# Patient Record
Sex: Male | Born: 1965 | Race: White | Hispanic: No | Marital: Married | Smoking: Never smoker
Health system: Southern US, Community
[De-identification: ages and names within clinical notes are randomized; demographics above are authoritative.]

## PROBLEM LIST (undated history)

## (undated) DIAGNOSIS — F32A Depression, unspecified: Secondary | ICD-10-CM

## (undated) DIAGNOSIS — M8700 Idiopathic aseptic necrosis of unspecified bone: Secondary | ICD-10-CM

## (undated) DIAGNOSIS — Z21 Asymptomatic human immunodeficiency virus [HIV] infection status: Secondary | ICD-10-CM

## (undated) DIAGNOSIS — K219 Gastro-esophageal reflux disease without esophagitis: Secondary | ICD-10-CM

## (undated) DIAGNOSIS — F419 Anxiety disorder, unspecified: Secondary | ICD-10-CM

## (undated) DIAGNOSIS — M359 Systemic involvement of connective tissue, unspecified: Secondary | ICD-10-CM

## (undated) DIAGNOSIS — F329 Major depressive disorder, single episode, unspecified: Secondary | ICD-10-CM

## (undated) DIAGNOSIS — B20 Human immunodeficiency virus [HIV] disease: Secondary | ICD-10-CM

## (undated) DIAGNOSIS — M19049 Primary osteoarthritis, unspecified hand: Secondary | ICD-10-CM

## (undated) HISTORY — PX: JOINT REPLACEMENT: SHX530

## (undated) HISTORY — DX: Primary osteoarthritis, unspecified hand: M19.049

## (undated) HISTORY — PX: TONSILLECTOMY: SUR1361

---
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
## 1996-03-04 DIAGNOSIS — Z21 Asymptomatic human immunodeficiency virus [HIV] infection status: Secondary | ICD-10-CM | POA: Insufficient documentation

## 2011-03-05 HISTORY — PX: CHOLECYSTECTOMY: SHX55

## 2014-10-05 ENCOUNTER — Emergency Department: Payer: Medicare Other

## 2014-10-05 ENCOUNTER — Emergency Department
Admission: EM | Admit: 2014-10-05 | Discharge: 2014-10-05 | Disposition: A | Discharge status: Home / Self Care | Payer: Medicare Other | Attending: Student | Admitting: Student

## 2014-10-05 ENCOUNTER — Other Ambulatory Visit: Payer: Self-pay

## 2014-10-05 ENCOUNTER — Encounter: Payer: Self-pay | Admitting: Medical Oncology

## 2014-10-05 DIAGNOSIS — T426X5A Adverse effect of other antiepileptic and sedative-hypnotic drugs, initial encounter: Secondary | ICD-10-CM | POA: Insufficient documentation

## 2014-10-05 DIAGNOSIS — Z96642 Presence of left artificial hip joint: Secondary | ICD-10-CM | POA: Diagnosis not present

## 2014-10-05 DIAGNOSIS — G8929 Other chronic pain: Secondary | ICD-10-CM | POA: Diagnosis not present

## 2014-10-05 DIAGNOSIS — F1210 Cannabis abuse, uncomplicated: Secondary | ICD-10-CM | POA: Diagnosis not present

## 2014-10-05 DIAGNOSIS — F1310 Sedative, hypnotic or anxiolytic abuse, uncomplicated: Secondary | ICD-10-CM | POA: Diagnosis not present

## 2014-10-05 DIAGNOSIS — Z79899 Other long term (current) drug therapy: Secondary | ICD-10-CM | POA: Diagnosis not present

## 2014-10-05 DIAGNOSIS — Z880 Allergy status to penicillin: Secondary | ICD-10-CM | POA: Insufficient documentation

## 2014-10-05 DIAGNOSIS — Z21 Asymptomatic human immunodeficiency virus [HIV] infection status: Secondary | ICD-10-CM | POA: Insufficient documentation

## 2014-10-05 DIAGNOSIS — Z792 Long term (current) use of antibiotics: Secondary | ICD-10-CM | POA: Insufficient documentation

## 2014-10-05 DIAGNOSIS — L989 Disorder of the skin and subcutaneous tissue, unspecified: Secondary | ICD-10-CM

## 2014-10-05 DIAGNOSIS — M87051 Idiopathic aseptic necrosis of right femur: Secondary | ICD-10-CM | POA: Insufficient documentation

## 2014-10-05 DIAGNOSIS — T404X5A Adverse effect of other synthetic narcotics, initial encounter: Secondary | ICD-10-CM | POA: Insufficient documentation

## 2014-10-05 DIAGNOSIS — T50905A Adverse effect of unspecified drugs, medicaments and biological substances, initial encounter: Secondary | ICD-10-CM

## 2014-10-05 DIAGNOSIS — R000 Tachycardia, unspecified: Secondary | ICD-10-CM | POA: Insufficient documentation

## 2014-10-05 DIAGNOSIS — B20 Human immunodeficiency virus [HIV] disease: Secondary | ICD-10-CM

## 2014-10-05 HISTORY — DX: Asymptomatic human immunodeficiency virus (hiv) infection status: Z21

## 2014-10-05 HISTORY — DX: Human immunodeficiency virus (HIV) disease: B20

## 2014-10-05 LAB — URINALYSIS COMPLETE WITH MICROSCOPIC (ARMC ONLY)
Bacteria, UA: NONE SEEN
Bilirubin Urine: NEGATIVE
Glucose, UA: NEGATIVE mg/dL
HGB URINE DIPSTICK: NEGATIVE
LEUKOCYTES UA: NEGATIVE
Nitrite: NEGATIVE
PROTEIN: NEGATIVE mg/dL
Specific Gravity, Urine: 1.01 (ref 1.005–1.030)
Squamous Epithelial / LPF: NONE SEEN
pH: 8 (ref 5.0–8.0)

## 2014-10-05 LAB — COMPREHENSIVE METABOLIC PANEL
ALBUMIN: 4.9 g/dL (ref 3.5–5.0)
ALK PHOS: 332 U/L — AB (ref 38–126)
ALT: 242 U/L — ABNORMAL HIGH (ref 17–63)
AST: 188 U/L — ABNORMAL HIGH (ref 15–41)
Anion gap: 11 (ref 5–15)
BILIRUBIN TOTAL: 0.8 mg/dL (ref 0.3–1.2)
BUN: 6 mg/dL (ref 6–20)
CALCIUM: 9.2 mg/dL (ref 8.9–10.3)
CO2: 27 mmol/L (ref 22–32)
CREATININE: 0.61 mg/dL (ref 0.61–1.24)
Chloride: 101 mmol/L (ref 101–111)
GFR calc non Af Amer: >60 mL/min (ref >60)
GLUCOSE: 117 mg/dL — AB (ref 65–99)
POTASSIUM: 3.7 mmol/L (ref 3.5–5.1)
SODIUM: 139 mmol/L (ref 135–145)
TOTAL PROTEIN: 8 g/dL (ref 6.5–8.1)

## 2014-10-05 LAB — URINE DRUG SCREEN, QUALITATIVE (ARMC ONLY)
AMPHETAMINES, UR SCREEN: NOT DETECTED
Barbiturates, Ur Screen: NOT DETECTED
Benzodiazepine, Ur Scrn: NOT DETECTED
Cannabinoid 50 Ng, Ur ~~LOC~~: POSITIVE — AB
Cocaine Metabolite,Ur ~~LOC~~: NOT DETECTED
MDMA (Ecstasy)Ur Screen: NOT DETECTED
Methadone Scn, Ur: NOT DETECTED
OPIATE, UR SCREEN: NOT DETECTED
Phencyclidine (PCP) Ur S: NOT DETECTED
Tricyclic, Ur Screen: POSITIVE — AB

## 2014-10-05 LAB — CBC
HEMATOCRIT: 43.9 % (ref 40.0–52.0)
Hemoglobin: 14.8 g/dL (ref 13.0–18.0)
MCH: 31.8 pg (ref 26.0–34.0)
MCHC: 33.8 g/dL (ref 32.0–36.0)
MCV: 94.3 fL (ref 80.0–100.0)
PLATELETS: 114 10*3/uL — AB (ref 150–440)
RBC: 4.66 MIL/uL (ref 4.40–5.90)
RDW: 12.9 % (ref 11.5–14.5)
WBC: 5 10*3/uL (ref 3.8–10.6)

## 2014-10-05 LAB — TROPONIN I: Troponin I: <0.03 ng/mL (ref <0.031)

## 2014-10-05 LAB — TSH: TSH: 0.958 u[IU]/mL (ref 0.350–4.500)

## 2014-10-05 MED ORDER — SODIUM CHLORIDE 0.9 % IV BOLUS (SEPSIS)
1000.0000 mL | Freq: Once | INTRAVENOUS | Status: AC
  Administered 2014-10-05: 1000 mL via INTRAVENOUS

## 2014-10-05 MED ORDER — DOXYCYCLINE HYCLATE 50 MG PO CAPS: 100.0000 mg | ORAL_CAPSULE | Freq: Two times a day (BID) | ORAL | Status: DC

## 2014-10-05 MED ORDER — DOXYCYCLINE HYCLATE 100 MG PO TABS
100.0000 mg | ORAL_TABLET | Freq: Once | ORAL | Status: AC
  Administered 2014-10-05: 100 mg via ORAL
  Filled 2014-10-05: qty 1

## 2014-10-05 NOTE — ED Provider Notes (Signed)
-----------------------------------------   5:28 PM on 10/05/2014 -----------------------------------------  Heart rate improved to 98 bpm after fluids. Patient is amenable to discharge and will follow up with his doctor tomorrow. Discharged with return precautions and discharge instructions provided by Dr. Carollee Massed.  Gayla Doss, MD 10/05/14 1728

## 2014-10-05 NOTE — ED Provider Notes (Signed)
[facility] Emergency Department Provider Note  ____________________________________________  Time seen: 1352  I have reviewed the triage vital signs and the nursing notes.   HISTORY  Chief Complaint Tachycardia after ingestion of old medicine    HPI Roy Trujillo is a 49 y.o. male who presents to the emergency department shaky and nervous after having taken a 25-year-old Flexeril and some older hydrocodone on this morning due to pain in his hips.  The patient has a history of avascular necrosis. 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 He is currently out of those medications. He has an appointment in [place], where he used to live until recently, with his doctors there tomorrow.  In addition to feeling shaky and nervous and having some palpitations, feeling like his heart is racing, the patient woke this morning with what he describes as a insect sting behind his left shoulder. He has an area that is erythematous with the center portion being somewhat excoriated.  He denies any fever or chills. He has HIV, is on antiretrovirals, and while he does not know what his CD4 count or his viral load, he reports he thinks that they are both good.    Past Medical History  Diagnosis Date  . HIV (human immunodeficiency virus infection)     There are no active problems to display for this patient.   Past Surgical History  Procedure Laterality Date  . Joint replacement    . Cholecystectomy      Current Outpatient Rx  Name  Route  Sig  Dispense  Refill  . abacavir (ZIAGEN) 300 MG tablet   Oral   Take 600 mg by mouth daily.         Marland Kitchen elvitegravir-cobicistat-emtricitabine-tenofovir (STRIBILD) 150-150-200-300 MG TABS tablet   Oral   Take 1 tablet by mouth daily with breakfast.         . gabapentin (NEURONTIN) 300 MG capsule   Oral   Take 300 mg by mouth 3 (three) times  daily.         . promethazine (PHENERGAN) 25 MG tablet   Oral   Take 25 mg by mouth every 6 (six) hours as needed for nausea or vomiting.         . traMADol (ULTRAM) 50 MG tablet   Oral   Take 50 mg by mouth every 6 (six) hours as needed.         . doxycycline (VIBRAMYCIN) 50 MG capsule   Oral   Take 2 capsules (100 mg total) by mouth 2 (two) times daily.   28 capsule   0     Allergies Penicillins; Bactrim; and Zerit  No family history on file.  Social History History  Substance Use Topics  . Smoking status: Never Smoker   . Smokeless tobacco: Not on file  . Alcohol Use: Yes     Comment: occ    Review of Systems  Constitutional: Negative for fever. ENT: Negative for sore throat. Cardiovascular: Negative for chest pain. Positive for since of heart racing. Respiratory: Negative for shortness of breath. Gastrointestinal: Negative for abdominal pain, vomiting and diarrhea. Genitourinary: Negative for dysuria. Musculoskeletal: history of avascular necrosis with chronic hip pain.. Skin: positive for a circular lesion behind his left shoulder. Neurological: Negative for headaches   10-point ROS otherwise negative.  ____________________________________________   PHYSICAL EXAM:  VITAL SIGNS: ED Triage Vitals  Enc Vitals Group  BP 10/05/14 1140 174/147 mmHg     Pulse Rate 10/05/14 1140 117     Resp 10/05/14 1140 20     Temp 10/05/14 1140 98.2 F (36.8 C)     Temp Source 10/05/14 1140 Oral     SpO2 10/05/14 1140 99 %     Weight 10/05/14 1140 110 lb (49.896 kg)     Height 10/05/14 1140 5\' 6"  (1.676 m)     Head Cir --      Peak Flow --      Pain Score 10/05/14 1141 0     Pain Loc --      Pain Edu? --      Excl. in GC? --     Constitutional:  Alert and oriented. Thin but overall appears well-nourished and well-developed. ENT   Head: Normocephalic and atraumatic.   Nose: No congestion/rhinnorhea.   Mouth/Throat: Mucous membranes are  moist. Cardiovascular: tachycardic to 120 on exam, regular rhythm, no murmur noted Respiratory:  Normal respiratory effort, no tachypnea.    Breath sounds are clear and equal bilaterally.  Gastrointestinal: Soft and nontender. No distention.  Back: No muscle spasm, no tenderness, no CVA tenderness. Musculoskeletal: No deformity noted. Nontender with normal range of motion in all extremities.  No noted edema. Neurologic:  Normal speech and language. No gross focal neurologic deficits are appreciated.  Skin:  There is a circular lesion behind his left shoulder. There appears to be a focal central area that almost looks like the patient may have been stung by something. The area surrounding this with a 2-2-1/2 cm diameter appears excoriated and somewhat red. The area beyond that which extends another 1 cm, is erythemic. There is no nodule underneath it, it does not appear to be an abscess. On firm exam there is no drainage. Psychiatric: Mood and affect are normal. Speech and behavior are normal.  ____________________________________________    LABS (pertinent positives/negatives)  Labs Reviewed  CBC - Abnormal; Notable for the following:    Platelets 114 (*)    All other components within normal limits  COMPREHENSIVE METABOLIC PANEL - Abnormal; Notable for the following:    Glucose, Bld 117 (*)    AST 188 (*)    ALT 242 (*)    Alkaline Phosphatase 332 (*)    All other components within normal limits  URINALYSIS COMPLETEWITH MICROSCOPIC ([facility] ONLY) - Abnormal; Notable for the following:    Color, Urine YELLOW (*)    APPearance CLEAR (*)    Ketones, ur 1+ (*)    All other components within normal limits  URINE DRUG SCREEN, QUALITATIVE ([facility] ONLY) - Abnormal; Notable for the following:    Tricyclic, Ur Screen POSITIVE (*)    Cannabinoid 50 Ng, Ur [place] POSITIVE (*)    All other components within normal limits  CULTURE, BLOOD (ROUTINE X 2)  CULTURE, BLOOD (ROUTINE X 2)  TROPONIN I  TSH      ____________________________________________   EKG  ED ECG REPORT I, Carmen Vallecillo W, the attending physician, personally viewed and interpreted this ECG.   Date: 10/05/2014  EKG Time: 11:35 AM  Rate: 111  Rhythm: sinus tachycardia  Axis: Normal  Intervals: Normal  ST&T Change: None noted   ____________________________________________    RADIOLOGY  Chest x-ray: Normal, no consolidation or edema  ____________________________________________  INITIAL IMPRESSION / ASSESSMENT AND PLAN / ED COURSE  Pertinent labs & imaging results that were available during my care of the patient were reviewed by me and considered in  my medical decision making (see chart for details).   Unclear cause of feeling jittery with tachycardia. While the patient appears to focus on this complaint beginning today after taking some old medication, nurse's note reports that he has felt this way for a few days. Given his HIV status, despite the fact that he is on antiretrovirals and reports he has a good CD4 count, I am concerned about possible infection. This is in conjunction with the skin lesion behind his left shoulder that appeared today.  I have ordered blood cultures 2. He'll receive 1 L normal saline to address the tachycardia.  His white blood cell count is normal, as is his red blood cell count. Overall labs are good except for the noted elevation in alkaline phosphatase and LFTs.  ----------------------------------------- 4:05 PM on 10/05/2014 -----------------------------------------  Patient's heart rate has improved some after 1 L of normal saline. I discussed my concerns with him. We have discussed staying in the hospital, but he prefers discharge so that he can see his regular doctors tomorrow at 9 AM as scheduled.   I will start him on doxycycline. We will treat him with a second liter due to still somewhat elevated heart rate. The patient feels better and looks well.  I will  last my colleague Dr. Inocencio Homes to reassess the patient's heart rate and status. If he continues to look well and he prefers discharge, we will discharge him for follow-up tomorrow a.m.  ____________________________________________   FINAL CLINICAL IMPRESSION(S) / ED DIAGNOSES  Final diagnoses:  Sinus tachycardia  Medication reaction, initial encounter  HIV (human immunodeficiency virus infection)  Skin lesion      Darien Ramus, MD 10/05/14 [phone]

## 2014-10-05 NOTE — ED Notes (Signed)
Pt to er with reports that he has been feeling shaky and nervous for the past few days. States that he feels like his heart is racing.

## 2014-10-05 NOTE — ED Notes (Signed)
Pt reports taking a "49 year old flexeril" this morning.

## 2014-10-05 NOTE — Discharge Instructions (Signed)
It is not clear what is causing your feelings of jitteriness and elevated heart rate. Your White blood cell count is normal as is the rest of your blood tests except for your liver enzymes and alkaline phosphatase - they are elevated. We discussed the option of further treatment and observation in the hospital. He preferred to be discharged to see your regular doctor tomorrow morning as scheduled. Take doxycycline, and antibiotic, as prescribed. Blood cultures are pending. Return to [facility] or go to the nearest emergency department if you have fevers, feel weak, or if you have other urgent concerns.

## 2014-10-10 LAB — CULTURE, BLOOD (ROUTINE X 2)
CULTURE: NO GROWTH
Culture: NO GROWTH

## 2014-11-18 ENCOUNTER — Encounter: Payer: Self-pay | Admitting: *Deleted

## 2014-11-18 ENCOUNTER — Emergency Department
Admission: EM | Admit: 2014-11-18 | Discharge: 2014-11-18 | Disposition: A | Discharge status: Home / Self Care | Payer: Medicare Other | Attending: Emergency Medicine | Admitting: Emergency Medicine

## 2014-11-18 DIAGNOSIS — Z880 Allergy status to penicillin: Secondary | ICD-10-CM | POA: Diagnosis not present

## 2014-11-18 DIAGNOSIS — F32A Depression, unspecified: Secondary | ICD-10-CM

## 2014-11-18 DIAGNOSIS — F1110 Opioid abuse, uncomplicated: Secondary | ICD-10-CM | POA: Insufficient documentation

## 2014-11-18 DIAGNOSIS — F1210 Cannabis abuse, uncomplicated: Secondary | ICD-10-CM | POA: Diagnosis not present

## 2014-11-18 DIAGNOSIS — Z008 Encounter for other general examination: Secondary | ICD-10-CM | POA: Diagnosis present

## 2014-11-18 DIAGNOSIS — F329 Major depressive disorder, single episode, unspecified: Secondary | ICD-10-CM | POA: Diagnosis not present

## 2014-11-18 DIAGNOSIS — Z79899 Other long term (current) drug therapy: Secondary | ICD-10-CM | POA: Insufficient documentation

## 2014-11-18 LAB — URINE DRUG SCREEN, QUALITATIVE (ARMC ONLY)
Amphetamines, Ur Screen: NOT DETECTED
BENZODIAZEPINE, UR SCRN: NOT DETECTED
Barbiturates, Ur Screen: NOT DETECTED
Cannabinoid 50 Ng, Ur ~~LOC~~: POSITIVE — AB
Cocaine Metabolite,Ur ~~LOC~~: NOT DETECTED
MDMA (Ecstasy)Ur Screen: NOT DETECTED
METHADONE SCREEN, URINE: NOT DETECTED
Opiate, Ur Screen: POSITIVE — AB
Phencyclidine (PCP) Ur S: NOT DETECTED
Tricyclic, Ur Screen: NOT DETECTED

## 2014-11-18 LAB — COMPREHENSIVE METABOLIC PANEL
ALBUMIN: 4.9 g/dL (ref 3.5–5.0)
ALT: 333 U/L — ABNORMAL HIGH (ref 17–63)
ANION GAP: 8 (ref 5–15)
AST: 783 U/L — AB (ref 15–41)
Alkaline Phosphatase: 530 U/L — ABNORMAL HIGH (ref 38–126)
BUN: 14 mg/dL (ref 6–20)
CHLORIDE: 106 mmol/L (ref 101–111)
CO2: 27 mmol/L (ref 22–32)
Calcium: 8.9 mg/dL (ref 8.9–10.3)
Creatinine, Ser: 0.77 mg/dL (ref 0.61–1.24)
GFR calc Af Amer: >60 mL/min (ref >60)
GFR calc non Af Amer: >60 mL/min (ref >60)
GLUCOSE: 115 mg/dL — AB (ref 65–99)
POTASSIUM: 4.1 mmol/L (ref 3.5–5.1)
SODIUM: 141 mmol/L (ref 135–145)
TOTAL PROTEIN: 7.9 g/dL (ref 6.5–8.1)
Total Bilirubin: 1 mg/dL (ref 0.3–1.2)

## 2014-11-18 LAB — CBC
HCT: 40 % (ref 40.0–52.0)
HEMOGLOBIN: 13.6 g/dL (ref 13.0–18.0)
MCH: 32.6 pg (ref 26.0–34.0)
MCHC: 34 g/dL (ref 32.0–36.0)
MCV: 96 fL (ref 80.0–100.0)
Platelets: 122 10*3/uL — ABNORMAL LOW (ref 150–440)
RBC: 4.17 MIL/uL — ABNORMAL LOW (ref 4.40–5.90)
RDW: 15.2 % — AB (ref 11.5–14.5)
WBC: 5 10*3/uL (ref 3.8–10.6)

## 2014-11-18 LAB — SALICYLATE LEVEL: Salicylate Lvl: <4 mg/dL (ref 2.8–30.0)

## 2014-11-18 LAB — ETHANOL: Alcohol, Ethyl (B): 205 mg/dL — ABNORMAL HIGH (ref <5)

## 2014-11-18 LAB — ACETAMINOPHEN LEVEL

## 2014-11-18 MED ORDER — ACETAMINOPHEN 325 MG PO TABS: 650.0000 mg | ORAL_TABLET | Freq: Once | ORAL | Status: DC

## 2014-11-18 MED ORDER — ONDANSETRON 8 MG PO TBDP
4.0000 mg | ORAL_TABLET | Freq: Once | ORAL | Status: AC
  Administered 2014-11-18: 4 mg via ORAL
  Filled 2014-11-18: qty 0.5

## 2014-11-18 MED ORDER — IBUPROFEN 800 MG PO TABS
ORAL_TABLET | ORAL | Status: AC
  Administered 2014-11-18: 800 mg via ORAL
  Filled 2014-11-18: qty 1

## 2014-11-18 MED ORDER — ONDANSETRON 4 MG PO TBDP
ORAL_TABLET | ORAL | Status: AC
  Administered 2014-11-18: 4 mg via ORAL
  Filled 2014-11-18: qty 1

## 2014-11-18 MED ORDER — IBUPROFEN 800 MG PO TABS
800.0000 mg | ORAL_TABLET | Freq: Once | ORAL | Status: AC
  Administered 2014-11-18: 800 mg via ORAL

## 2014-11-18 NOTE — ED Notes (Signed)
Pt. Noted sleeping in room. No complaints or concerns voiced. No distress or abnormal behavior noted. Will continue to monitor with security cameras. Q 15 minute rounds continue. 

## 2014-11-18 NOTE — ED Notes (Signed)
Pt. Noted in day room. No complaints or concerns voiced. No distress or abnormal behavior noted. Will continue to monitor with security cameras. Q 15 minute rounds continue. 

## 2014-11-18 NOTE — ED Notes (Signed)
BEHAVIORAL HEALTH ROUNDING Patient sleeping: No. Patient alert and oriented: yes Behavior appropriate: Yes.  ; If no, describe:  Nutrition and fluids offered: Yes  Toileting and hygiene offered: Yes  Sitter present: no Law enforcement present: Yes, ODS 

## 2014-11-18 NOTE — ED Notes (Signed)

## 2014-11-18 NOTE — ED Notes (Signed)
Pt given breakfast tray. Vitals taken. 

## 2014-11-18 NOTE — ED Notes (Signed)
BEHAVIORAL HEALTH ROUNDING Patient sleeping: No. Patient alert and oriented: yes Behavior appropriate: Yes.  ; If no, describe:  Nutrition and fluids offered: Yes  Toileting and hygiene offered: Yes  Sitter present: no Law enforcement present: Yes  

## 2014-11-18 NOTE — ED Notes (Signed)
Pt states that his husbands mother died and blamed him for it, he grabbed a bottle of morphine and squirted "a little shot" in his mouth,Unable to quantify the dosage, grabbed a knife and threatened to hurt himself.

## 2014-11-18 NOTE — ED Provider Notes (Signed)
[facility] Emergency Department Provider Note  ____________________________________________  Time seen: Approximately 2:23 AM  I have reviewed the triage vital signs and the nursing notes.   HISTORY  Chief Complaint Psychiatric Evaluation    HPI Roy Trujillo is a 49 y.o. male who presents to the ED from home for a chief complain of depression. Patient states his husband's terminally-ill mother just died several hours ago. Felt like his husband was blaming him for her death because patient was supposed to be watching her. Patient ran off into the woods with a morphine syringe and squirted "a little shot" into his mouth. Also grabbed a knife and threatened to harm himself.States he was doing it for show and denies active SI/HI/AH/VH   Past Medical History  Diagnosis Date  . HIV (human immunodeficiency virus infection)     There are no active problems to display for this patient.   Past Surgical History  Procedure Laterality Date  . Joint replacement    . Cholecystectomy      Current Outpatient Rx  Name  Route  Sig  Dispense  Refill  . abacavir (ZIAGEN) 300 MG tablet   Oral   Take 600 mg by mouth daily.         Marland Kitchen doxycycline (VIBRAMYCIN) 50 MG capsule   Oral   Take 2 capsules (100 mg total) by mouth 2 (two) times daily.   28 capsule   0   . elvitegravir-cobicistat-emtricitabine-tenofovir (STRIBILD) 150-150-200-300 MG TABS tablet   Oral   Take 1 tablet by mouth daily with breakfast.         . gabapentin (NEURONTIN) 300 MG capsule   Oral   Take 300 mg by mouth 3 (three) times daily.         . promethazine (PHENERGAN) 25 MG tablet   Oral   Take 25 mg by mouth every 6 (six) hours as needed for nausea or vomiting.         . traMADol (ULTRAM) 50 MG tablet   Oral   Take 50 mg by mouth every 6 (six) hours as needed.           Allergies Penicillins; Bactrim; and Zerit  No family history on file.  Social History Social  History  Substance Use Topics  . Smoking status: Never Smoker   . Smokeless tobacco: None  . Alcohol Use: Yes     Comment: occ    Review of Systems Constitutional: No fever/chills Eyes: No visual changes. ENT: No sore throat. Cardiovascular: Denies chest pain. Respiratory: Denies shortness of breath. Gastrointestinal: No abdominal pain.  No nausea, no vomiting.  No diarrhea.  No constipation. Genitourinary: Negative for dysuria. Musculoskeletal: Negative for back pain. Skin: Negative for rash. Neurological: Negative for headaches, focal weakness or numbness. Psychiatric:Positive for depression.  10-point ROS otherwise negative.  ____________________________________________   PHYSICAL EXAM:  VITAL SIGNS: ED Triage Vitals  Enc Vitals Group     BP 11/18/14 0038 147/87 mmHg     Pulse Rate 11/18/14 0038 123     Resp 11/18/14 0038 16     Temp 11/18/14 0038 98.8 F (37.1 C)     Temp Source 11/18/14 0038 Oral     SpO2 11/18/14 0038 95 %     Weight 11/18/14 0038 110 lb (49.896 kg)     Height 11/18/14 0038 5\' 6"  (1.676 m)     Head Cir --      Peak Flow --      Pain  Score 11/18/14 0039 0     Pain Loc --      Pain Edu? --      Excl. in GC? --     Constitutional: Alert and oriented. Tearful and in mild acute distress. Eyes: Conjunctivae are normal. PERRL. EOMI. Head: Atraumatic. Nose: No congestion/rhinnorhea. Mouth/Throat: Mucous membranes are moist.  Oropharynx non-erythematous. Neck: No stridor.   Cardiovascular: Normal rate, regular rhythm. Grossly normal heart sounds.  Good peripheral circulation. Respiratory: Normal respiratory effort.  No retractions. Lungs CTAB. Gastrointestinal: Soft and nontender. No distention. No abdominal bruits. No CVA tenderness. Musculoskeletal: No lower extremity tenderness nor edema.  No joint effusions. Neurologic:  Normal speech and language. No gross focal neurologic deficits are appreciated. No gait instability. Skin:  Skin is  warm, dry and intact. No rash noted. Psychiatric: Mood and affect are flat. Speech and behavior are flat.  ____________________________________________   LABS (all labs ordered are listed, but only abnormal results are displayed)  Labs Reviewed  COMPREHENSIVE METABOLIC PANEL - Abnormal; Notable for the following:    Glucose, Bld 115 (*)    AST 783 (*)    ALT 333 (*)    Alkaline Phosphatase 530 (*)    All other components within normal limits  ETHANOL - Abnormal; Notable for the following:    Alcohol, Ethyl (B) 205 (*)    All other components within normal limits  ACETAMINOPHEN LEVEL - Abnormal; Notable for the following:    Acetaminophen (Tylenol), Serum <10 (*)    All other components within normal limits  CBC - Abnormal; Notable for the following:    RBC 4.17 (*)    RDW 15.2 (*)    Platelets 122 (*)    All other components within normal limits  SALICYLATE LEVEL  URINE DRUG SCREEN, QUALITATIVE ([facility] ONLY)   ____________________________________________  EKG  None ____________________________________________  RADIOLOGY  None ____________________________________________   PROCEDURES  Procedure(s) performed: None  Critical Care performed: No  ____________________________________________   INITIAL IMPRESSION / ASSESSMENT AND PLAN / ED COURSE  Pertinent labs & imaging results that were available during my care of the patient were reviewed by me and considered in my medical decision making (see chart for details).  49 year old male who presents depressed after arguing with husband over recent death of his mother-in-law. Made a suicidal gesture by squirting some morphine into his mouth. Currently denies SI and contracts for safety in the ED. Labs notable for elevated LFTs which are slightly higher than baseline. Patient not complaining of abdominal pain, nausea & vomiting. Currently medically cleared for psychiatry consult. Will move to  [facility].  ----------------------------------------- 6:29 AM on 11/18/2014 -----------------------------------------  No events overnight. Patient resting in no acute distress. Awaiting psychiatry consult this morning. ____________________________________________   FINAL CLINICAL IMPRESSION(S) / ED DIAGNOSES  Final diagnoses:  Depression      Irean Hong, MD 11/18/14 [phone]

## 2014-11-18 NOTE — Discharge Instructions (Signed)

## 2014-11-18 NOTE — ED Notes (Signed)
Pt having n/v x2 this am. Medication given accordingly .

## 2014-11-18 NOTE — ED Notes (Signed)
Pt. To [facility] from ED ambulatory without difficulty, to room 4  . Report from [place] RN. Pt. Is alert and oriented, warm and dry in no distress. Pt. Denies SI, HI, and AVH. Pt. Calm and cooperative. Pt. Made aware of security cameras and Q15 minute rounds. Pt. Encouraged to let Nursing staff know of any concerns or needs.

## 2014-11-18 NOTE — ED Notes (Signed)
BEHAVIORAL HEALTH ROUNDING Patient sleeping: No. Patient alert and oriented: no Behavior appropriate: Yes.  ; If no, describe:  Nutrition and fluids offered: Yes  Toileting and hygiene offered: No Sitter present: no Law enforcement present: Yes

## 2014-11-18 NOTE — ED Notes (Signed)
Pt attempted to give urine sample but unsuccessful. Officer went in bathroom with pt for pt to change into scrubs. Belongings put in secure locker area.

## 2014-11-18 NOTE — ED Notes (Addendum)
Called report to [employer], Jillyn Hidden. Patient ambulatory to [facility] without any difficulty. Patient received in sally port by [employer] and Visual merchandiser.

## 2014-11-18 NOTE — ED Notes (Signed)
Pt received lunch tray with sprite

## 2014-11-18 NOTE — ED Provider Notes (Signed)
Patient requesting to leave, denies suicidal or homicidal ideations. He is here voluntarily, will be discharged with diagnosis of depression, advised to have close outpatient follow-up.  Emily Filbert, MD 11/18/14 [phone]

## 2014-11-18 NOTE — BH Assessment (Signed)
Assessment Note  Roy Trujillo is an 49 y.o. male presenting to the ED with complaints of depression. Patient reports his husband's terminally-ill mother just died several hours ago and feels like his husband was blaming him for her death because patient was supposed to be watching her. Patient reports he  ran off into the woods with a morphine syringe and squirted "a little shot" into his mouth.Pt reports he also grabbed a knife and threatened to harm himself.He states he was doing it for show and denied wanting to harm himself.    Pt denies SI/HI and A/V hallucinations.  Pt denies regular drug.alcohol use.  Axis I: Depressive Disorder NOS  Past Medical History:  Past Medical History  Diagnosis Date  . HIV (human immunodeficiency virus infection)     Past Surgical History  Procedure Laterality Date  . Joint replacement    . Cholecystectomy      Family History: No family history on file.  Social History:  reports that he has never smoked. He does not have any smokeless tobacco history on file. He reports that he drinks alcohol. He reports that he does not use illicit drugs.  Additional Social History:  Alcohol / Drug Use History of alcohol / drug use?: No history of alcohol / drug abuse (Pt denies)  CIWA: CIWA-Ar BP: (!) 147/87 mmHg Pulse Rate: (!) 123 COWS:    Allergies:  Allergies  Allergen Reactions  . Penicillins Other (See Comments)  . Bactrim [Sulfamethoxazole-Trimethoprim] Rash  . Zerit [Stavudine] Rash    Home Medications:  (Not in a hospital admission)  OB/GYN Status:  No LMP for male patient.  General Assessment Data Location of Assessment: [facility] ED TTS Assessment: In system Is this a Tele or Face-to-Face Assessment?: Face-to-Face Is this an Initial Assessment or a Re-assessment for this encounter?: Initial Assessment Marital status: Married [place] name: N/A Is patient pregnant?: No Pregnancy Status: No Living Arrangements: Spouse/significant  other Can pt return to current living arrangement?: Yes Admission Status: Voluntary Is patient capable of signing voluntary admission?: No Referral Source: Self/Family/Friend Insurance type: Medicare  Medical Screening Exam [facility] Walk-in ONLY) Medical Exam completed: Yes  Crisis Care Plan Living Arrangements: Spouse/significant other Name of Psychiatrist: N/A Name of Therapist: N/A  Education Status Is patient currently in school?: No Current Grade: N/A Highest grade of school patient has completed: N/A Name of school: N/A Contact person: N/A  Risk to self with the past 6 months Suicidal Ideation: No Has patient been a risk to self within the past 6 months prior to admission? : No Suicidal Intent: No Has patient had any suicidal intent within the past 6 months prior to admission? : No Is patient at risk for suicide?: No Suicidal Plan?: No Has patient had any suicidal plan within the past 6 months prior to admission? : No Access to Means: No What has been your use of drugs/alcohol within the last 12 months?: None reported Previous Attempts/Gestures: No How many times?: 0 Other Self Harm Risks: None reported Triggers for Past Attempts: None known Intentional Self Injurious Behavior: None Family Suicide History: No Recent stressful life event(s): Loss (Comment) (Death of mother-in-law) Persecutory voices/beliefs?: No Depression: Yes Depression Symptoms: Tearfulness, Feeling worthless/self pity Substance abuse history and/or treatment for substance abuse?: No Suicide prevention information given to non-admitted patients: Not applicable  Risk to Others within the past 6 months Homicidal Ideation: No Does patient have any lifetime risk of violence toward others beyond the six months prior to admission? :  No Thoughts of Harm to Others: No Current Homicidal Intent: No Current Homicidal Plan: No Access to Homicidal Means: No Identified Victim: N/A History of harm to  others?: No Assessment of Violence: None Noted Violent Behavior Description: N/A Does patient have access to weapons?: No Criminal Charges Pending?: No Does patient have a court date: No Is patient on probation?: No  Psychosis Hallucinations: None noted Delusions: None noted  Mental Status Report Appearance/Hygiene: In scrubs Eye Contact: Good Motor Activity: Freedom of movement Speech: Logical/coherent, Soft Level of Consciousness: Crying Mood: Sad Affect: Sad Anxiety Level: Minimal Thought Processes: Coherent Judgement: Impaired Orientation: Person, Place, Time, Situation Obsessive Compulsive Thoughts/Behaviors: None  Cognitive Functioning Concentration: Normal Memory: Recent Intact IQ: Average Insight: Poor Impulse Control: Poor Appetite: Fair Weight Loss: 0 Weight Gain: 0 Sleep: No Change Total Hours of Sleep: 8 Vegetative Symptoms: None  ADLScreening [facility] Assessment Services) Patient's cognitive ability adequate to safely complete daily activities?: Yes Patient able to express need for assistance with ADLs?: Yes Independently performs ADLs?: Yes (appropriate for developmental age)  Prior Inpatient Therapy Prior Inpatient Therapy: No Prior Therapy Dates: N/A Prior Therapy Facilty/Provider(s): N/A Reason for Treatment: N/A  Prior Outpatient Therapy Prior Outpatient Therapy: No Prior Therapy Dates: N/A Prior Therapy Facilty/Provider(s): N/A Reason for Treatment: N/A Does patient have an ACCT team?: No Does patient have Intensive In-House Services?  : No Does patient have [facility] services? : No Does patient have P4CC services?: No  ADL Screening (condition at time of admission) Patient's cognitive ability adequate to safely complete daily activities?: Yes Patient able to express need for assistance with ADLs?: Yes Independently performs ADLs?: Yes (appropriate for developmental age)       Abuse/Neglect Assessment (Assessment to be complete while  patient is alone) Physical Abuse: Denies Verbal Abuse: Denies Sexual Abuse: Denies Exploitation of patient/patient's resources: Denies Self-Neglect: Denies Values / Beliefs Cultural Requests During Hospitalization: None Spiritual Requests During Hospitalization: None Consults Spiritual Care Consult Needed: No Social Work Consult Needed: No Merchant navy officer (For Healthcare) Does patient have an advance directive?: No Would patient like information on creating an advanced directive?: Yes English as a second language teacher given    Additional Information 1:1 In Past 12 Months?: No CIRT Risk: No Elopement Risk: No     Disposition:  Disposition Initial Assessment Completed for this Encounter: Yes Disposition of Patient: Other dispositions Other disposition(s): Other (Comment) (Psych MD consult)  On Site Evaluation by:   Reviewed with Physician:    Roxana C Brand 11/18/2014 3:50 AM

## 2014-11-18 NOTE — ED Notes (Signed)
Pt. Noted in room. No complaints or concerns voiced. No distress or abnormal behavior noted. Will continue to monitor with security cameras. Q 15 minute rounds continue. 

## 2014-11-18 NOTE — ED Notes (Signed)
TTS counselor at bedside. 

## 2014-11-19 LAB — HEPATITIS PANEL, ACUTE
HEP B S AG: NEGATIVE
Hep A IgM: NEGATIVE
Hep B C IgM: NEGATIVE

## 2015-02-02 ENCOUNTER — Emergency Department: Payer: Medicare Other

## 2015-02-02 ENCOUNTER — Emergency Department
Admission: EM | Admit: 2015-02-02 | Discharge: 2015-02-02 | Disposition: A | Discharge status: Home / Self Care | Payer: Medicare Other | Attending: Emergency Medicine | Admitting: Emergency Medicine

## 2015-02-02 ENCOUNTER — Encounter: Payer: Self-pay | Admitting: *Deleted

## 2015-02-02 DIAGNOSIS — Z79899 Other long term (current) drug therapy: Secondary | ICD-10-CM | POA: Insufficient documentation

## 2015-02-02 DIAGNOSIS — S60222A Contusion of left hand, initial encounter: Secondary | ICD-10-CM | POA: Insufficient documentation

## 2015-02-02 DIAGNOSIS — Y9389 Activity, other specified: Secondary | ICD-10-CM | POA: Diagnosis not present

## 2015-02-02 DIAGNOSIS — W231XXA Caught, crushed, jammed, or pinched between stationary objects, initial encounter: Secondary | ICD-10-CM | POA: Diagnosis not present

## 2015-02-02 DIAGNOSIS — Z880 Allergy status to penicillin: Secondary | ICD-10-CM | POA: Insufficient documentation

## 2015-02-02 DIAGNOSIS — Y998 Other external cause status: Secondary | ICD-10-CM | POA: Diagnosis not present

## 2015-02-02 DIAGNOSIS — Z792 Long term (current) use of antibiotics: Secondary | ICD-10-CM | POA: Diagnosis not present

## 2015-02-02 DIAGNOSIS — Y9289 Other specified places as the place of occurrence of the external cause: Secondary | ICD-10-CM | POA: Insufficient documentation

## 2015-02-02 DIAGNOSIS — S6992XA Unspecified injury of left wrist, hand and finger(s), initial encounter: Secondary | ICD-10-CM | POA: Diagnosis present

## 2015-02-02 MED ORDER — NAPROXEN 500 MG PO TBEC
500.0000 mg | DELAYED_RELEASE_TABLET | Freq: Two times a day (BID) | ORAL | Status: DC
Start: 2015-02-02 — End: 2016-05-17

## 2015-02-02 MED ORDER — HYDROCODONE-ACETAMINOPHEN 5-325 MG PO TABS
2.0000 | ORAL_TABLET | Freq: Once | ORAL | Status: AC
  Administered 2015-02-02: 2 via ORAL
  Filled 2015-02-02: qty 2

## 2015-02-02 MED ORDER — KETOROLAC TROMETHAMINE 60 MG/2ML IM SOLN
60.0000 mg | Freq: Once | INTRAMUSCULAR | Status: AC
  Administered 2015-02-02: 60 mg via INTRAMUSCULAR
  Filled 2015-02-02: qty 2

## 2015-02-02 NOTE — ED Notes (Signed)
Patient had med hold until 15:38 before discharge from building

## 2015-02-02 NOTE — Discharge Instructions (Signed)
Hand Contusion ° A hand contusion is a deep bruise to the hand. Contusions happen when an injury causes bleeding under the skin. Signs of bruising include pain, puffiness (swelling), and discolored skin. The contusion may turn blue, purple, or yellow. °HOME CARE °· Put ice on the injured area. °¨ Put ice in a plastic bag. °¨ Place a towel between your skin and the bag. °¨ Leave the ice on for 15-20 minutes, 03-04 times a day. °· Only take medicines as told by your doctor. °· Use an elastic wrap only as told. You may remove the wrap for sleeping, showering, and bathing. Take the wrap off if you lose feeling (have numbness) in your fingers, or they turn blue or cold. Put the wrap on more loosely. °· Keep the hand raised (elevated) with pillows. °· Avoid using your hand too much if it is painful. °GET HELP RIGHT AWAY IF:  °· You have more redness, puffiness, or pain in your hand. °· Your puffiness or pain does not get better with medicine. °· You lose feeling in your hand, or you cannot move your fingers. °· Your hand turns cold or blue. °· You have pain when you move your fingers. °· Your hand feels warm. °· Your contusion does not get better in 2 days. °MAKE SURE YOU:  °· Understand these instructions. °· Will watch this condition. °· Will get help right away if you are not doing well or you get worse. °  °This information is not intended to replace advice given to you by your health care provider. Make sure you discuss any questions you have with your health care provider. °  °Document Released: 08/07/2007 Document Revised: 03/11/2014 Document Reviewed: 08/12/2011 °Elsevier Interactive Patient Education ©2016 [employer] ° °

## 2015-02-02 NOTE — Progress Notes (Signed)
Pt states he is not able to move his left index finger much due to slamming it in the door last pm. Pt does have a positive capillary refill.

## 2015-02-02 NOTE — ED Notes (Signed)
Door shut on pt left index finger yesterday c/o pain

## 2015-02-02 NOTE — ED Provider Notes (Signed)
[facility] Emergency Department Provider Note  ____________________________________________  Time seen: Approximately 1:50 PM  I have reviewed the triage vital signs and the nursing notes.   HISTORY  Chief Complaint Finger Injury    HPI Roy Trujillo is a 49 y.o. male who presents with complaints of left index finger pain. Patient states he got his hand/finger stuck in a barn door last night. Complains of pain to both finger and hand.   Past Medical History  Diagnosis Date  . HIV (human immunodeficiency virus infection) (HCC)     There are no active problems to display for this patient.   Past Surgical History  Procedure Laterality Date  . Joint replacement    . Cholecystectomy      Current Outpatient Rx  Name  Route  Sig  Dispense  Refill  . abacavir (ZIAGEN) 300 MG tablet   Oral   Take 600 mg by mouth daily.         Marland Kitchen doxycycline (VIBRAMYCIN) 50 MG capsule   Oral   Take 2 capsules (100 mg total) by mouth 2 (two) times daily.   28 capsule   0   . elvitegravir-cobicistat-emtricitabine-tenofovir (STRIBILD) 150-150-200-300 MG TABS tablet   Oral   Take 1 tablet by mouth daily with breakfast.         . gabapentin (NEURONTIN) 300 MG capsule   Oral   Take 300 mg by mouth 3 (three) times daily.         . naproxen (EC NAPROSYN) 500 MG EC tablet   Oral   Take 1 tablet (500 mg total) by mouth 2 (two) times daily with a meal.   60 tablet   0   . promethazine (PHENERGAN) 25 MG tablet   Oral   Take 25 mg by mouth every 6 (six) hours as needed for nausea or vomiting.         . traMADol (ULTRAM) 50 MG tablet   Oral   Take 50 mg by mouth every 6 (six) hours as needed.           Allergies Penicillins; Bactrim; and Zerit  No family history on file.  Social History Social History  Substance Use Topics  . Smoking status: Never Smoker   . Smokeless tobacco: None  . Alcohol Use: Yes     Comment: occ    Review of  Systems Constitutional: No fever/chills Eyes: No visual changes. ENT: No sore throat. Cardiovascular: Denies chest pain. Respiratory: Denies shortness of breath. Gastrointestinal: No abdominal pain.  No nausea, no vomiting.  No diarrhea.  No constipation. Genitourinary: Negative for dysuria. Musculoskeletal: Positive for left hand and finger pain Skin: Negative for rash. Neurological: Negative for headaches, focal weakness or numbness.  10-point ROS otherwise negative.  ____________________________________________   PHYSICAL EXAM:  VITAL SIGNS: ED Triage Vitals  Enc Vitals Group     BP 02/02/15 1254 149/69 mmHg     Pulse Rate 02/02/15 1254 77     Resp 02/02/15 1254 20     Temp 02/02/15 1254 98.2 F (36.8 C)     Temp Source 02/02/15 1254 Oral     SpO2 02/02/15 1254 100 %     Weight 02/02/15 1254 115 lb (52.164 kg)     Height 02/02/15 1254 5\' 6"  (1.676 m)     Head Cir --      Peak Flow --      Pain Score 02/02/15 1254 8     Pain Loc --  Pain Edu? --      Excl. in GC? --     Constitutional: Alert and oriented. Well appearing and in no acute distress. Cardiovascular: Normal rate, regular rhythm. Grossly normal heart sounds.  Good peripheral circulation. Respiratory: Normal respiratory effort.  No retractions. Lungs CTAB. Musculoskeletal: Positive for left index finger limited range of motion. Point tenderness at the base. Distal neurovascularly intact. Neurologic:  Normal speech and language. No gross focal neurologic deficits are appreciated. No gait instability. Skin:  Skin is warm, dry and intact. No rash noted. Psychiatric: Mood and affect are normal. Speech and behavior are normal.  ____________________________________________   LABS (all labs ordered are listed, but only abnormal results are displayed)  Labs Reviewed - No data to display ____________________________________________   RADIOLOGY  Left hand  contusion. ____________________________________________   PROCEDURES  Procedure(s) performed: None  Critical Care performed: No  ____________________________________________   INITIAL IMPRESSION / ASSESSMENT AND PLAN / ED COURSE  Pertinent labs & imaging results that were available during my care of the patient were reviewed by me and considered in my medical decision making (see chart for details). Acute left hand contusion Rx given for Anaprox DS twice a day patient follow-up PCP or return here with any worsening symptomology. Ace wrap provided for comfort for patient. ____________________________________________   FINAL CLINICAL IMPRESSION(S) / ED DIAGNOSES  Final diagnoses:  Hand contusion, left, initial encounter      Evangeline Dakin, PA-C 02/02/15 1520  Richardean Canal, MD 02/02/15 [phone]

## 2015-04-19 ENCOUNTER — Emergency Department
Admission: EM | Admit: 2015-04-19 | Discharge: 2015-04-19 | Disposition: A | Discharge status: Home / Self Care | Payer: Medicare Other | Attending: Emergency Medicine | Admitting: Emergency Medicine

## 2015-04-19 ENCOUNTER — Encounter: Payer: Self-pay | Admitting: *Deleted

## 2015-04-19 DIAGNOSIS — F419 Anxiety disorder, unspecified: Secondary | ICD-10-CM | POA: Diagnosis not present

## 2015-04-19 DIAGNOSIS — Z880 Allergy status to penicillin: Secondary | ICD-10-CM | POA: Insufficient documentation

## 2015-04-19 DIAGNOSIS — R112 Nausea with vomiting, unspecified: Secondary | ICD-10-CM | POA: Diagnosis present

## 2015-04-19 DIAGNOSIS — F1010 Alcohol abuse, uncomplicated: Secondary | ICD-10-CM | POA: Diagnosis not present

## 2015-04-19 LAB — CBC
HCT: 45 % (ref 40.0–52.0)
HEMOGLOBIN: 15.3 g/dL (ref 13.0–18.0)
MCH: 33.4 pg (ref 26.0–34.0)
MCHC: 34.1 g/dL (ref 32.0–36.0)
MCV: 98 fL (ref 80.0–100.0)
PLATELETS: 187 10*3/uL (ref 150–440)
RBC: 4.59 MIL/uL (ref 4.40–5.90)
RDW: 13.8 % (ref 11.5–14.5)
WBC: 8.6 10*3/uL (ref 3.8–10.6)

## 2015-04-19 LAB — URINALYSIS COMPLETE WITH MICROSCOPIC (ARMC ONLY)
Bacteria, UA: NONE SEEN
Bilirubin Urine: NEGATIVE
GLUCOSE, UA: NEGATIVE mg/dL
Hgb urine dipstick: NEGATIVE
Leukocytes, UA: NEGATIVE
NITRITE: NEGATIVE
Protein, ur: NEGATIVE mg/dL
SPECIFIC GRAVITY, URINE: 1.024 (ref 1.005–1.030)
Squamous Epithelial / LPF: NONE SEEN
pH: 5 (ref 5.0–8.0)

## 2015-04-19 LAB — COMPREHENSIVE METABOLIC PANEL
ALK PHOS: 172 U/L — AB (ref 38–126)
ALT: 65 U/L — ABNORMAL HIGH (ref 17–63)
ANION GAP: 16 — AB (ref 5–15)
AST: 45 U/L — ABNORMAL HIGH (ref 15–41)
Albumin: 5.6 g/dL — ABNORMAL HIGH (ref 3.5–5.0)
BUN: 12 mg/dL (ref 6–20)
CALCIUM: 8.9 mg/dL (ref 8.9–10.3)
CHLORIDE: 102 mmol/L (ref 101–111)
CO2: 21 mmol/L — AB (ref 22–32)
Creatinine, Ser: 0.7 mg/dL (ref 0.61–1.24)
GFR calc non Af Amer: >60 mL/min (ref >60)
Glucose, Bld: 87 mg/dL (ref 65–99)
POTASSIUM: 3.8 mmol/L (ref 3.5–5.1)
SODIUM: 139 mmol/L (ref 135–145)
Total Bilirubin: 1 mg/dL (ref 0.3–1.2)
Total Protein: 8.2 g/dL — ABNORMAL HIGH (ref 6.5–8.1)

## 2015-04-19 LAB — LIPASE, BLOOD: LIPASE: 24 U/L (ref 11–51)

## 2015-04-19 MED ORDER — LORAZEPAM 1 MG PO TABS: 1.0000 mg | ORAL_TABLET | Freq: Two times a day (BID) | ORAL | Status: AC

## 2015-04-19 MED ORDER — PROMETHAZINE HCL 25 MG/ML IJ SOLN
25.0000 mg | Freq: Once | INTRAMUSCULAR | Status: AC
  Administered 2015-04-19: 25 mg via INTRAVENOUS
  Filled 2015-04-19: qty 1

## 2015-04-19 MED ORDER — PROMETHAZINE HCL 25 MG PO TABS
25.0000 mg | ORAL_TABLET | ORAL | Status: DC | PRN
Start: 2015-04-19 — End: 2020-07-13

## 2015-04-19 MED ORDER — LORAZEPAM 2 MG/ML IJ SOLN
2.0000 mg | Freq: Once | INTRAMUSCULAR | Status: AC
  Administered 2015-04-19: 2 mg via INTRAVENOUS
  Filled 2015-04-19: qty 1

## 2015-04-19 MED ORDER — KETOROLAC TROMETHAMINE 30 MG/ML IJ SOLN
30.0000 mg | Freq: Once | INTRAMUSCULAR | Status: AC
  Administered 2015-04-19: 30 mg via INTRAVENOUS
  Filled 2015-04-19: qty 1

## 2015-04-19 MED ORDER — ONDANSETRON HCL 4 MG/2ML IJ SOLN
INTRAMUSCULAR | Status: AC
  Filled 2015-04-19: qty 2

## 2015-04-19 MED ORDER — ONDANSETRON HCL 4 MG/2ML IJ SOLN
4.0000 mg | Freq: Once | INTRAMUSCULAR | Status: AC | PRN
  Administered 2015-04-19: 4 mg via INTRAVENOUS

## 2015-04-19 NOTE — ED Notes (Addendum)
States nausea and vomiting since Sunday, states he has not been able to keep anything down, states hx of HIV and normally takes phenergan TID but states he has been out since Sunday, states he has been unable to keep his HIV medication down

## 2015-04-19 NOTE — ED Provider Notes (Addendum)
[facility] Emergency Department Provider Note     Time seen: ----------------------------------------- 8:56 AM on 04/19/2015 -----------------------------------------    I have reviewed the triage vital signs and the nursing notes.   HISTORY  Chief Complaint Nausea and Emesis    HPI Roy SEPTER is a 50 y.o. male who presents ER with nausea and vomiting since Sunday.Patient states he normally takes Phenergan but has run out of that since Sunday. He has not been able to take his HIV medications. Patient has been taking Phenergan for years, his persistent nausea was thought to be due to HIV medications. He currently drinks every day as well and this had trouble keeping alcohol down.   Past Medical History  Diagnosis Date  . HIV (human immunodeficiency virus infection) (HCC)     There are no active problems to display for this patient.   Past Surgical History  Procedure Laterality Date  . Joint replacement    . Cholecystectomy      Allergies Penicillins; Bactrim; and Zerit  Social History Social History  Substance Use Topics  . Smoking status: Never Smoker   . Smokeless tobacco: None  . Alcohol Use: Yes     Comment: occ    Review of Systems Constitutional: Negative for fever. Eyes: Negative for visual changes. ENT: Negative for sore throat. Cardiovascular: Negative for chest pain. Respiratory: Negative for shortness of breath. Gastrointestinal: Negative for abdominal pain, positive for vomiting Genitourinary: Negative for dysuria. Musculoskeletal: Negative for back pain. Skin: Negative for rash. Neurological: Negative for headaches, focal weakness or numbness.  10-point ROS otherwise negative.  ____________________________________________   PHYSICAL EXAM:  VITAL SIGNS: ED Triage Vitals  Enc Vitals Group     BP 04/19/15 0709 129/82 mmHg     Pulse Rate 04/19/15 0709 115     Resp 04/19/15 0709 18     Temp 04/19/15  0709 98.6 F (37 C)     Temp Source 04/19/15 0709 Oral     SpO2 04/19/15 0709 100 %     Weight 04/19/15 0709 115 lb (52.164 kg)     Height 04/19/15 0709 5\' 6"  (1.676 m)     Head Cir --      Peak Flow --      Pain Score 04/19/15 0710 10     Pain Loc --      Pain Edu? --      Excl. in GC? --     Constitutional: Alert and oriented. Anxious but in no acute distress Eyes: Conjunctivae are normal. PERRL. Normal extraocular movements. ENT   Head: Normocephalic and atraumatic.   Nose: No congestion/rhinnorhea.   Mouth/Throat: Mucous membranes are moist.   Neck: No stridor. Cardiovascular: Normal rate, regular rhythm. Normal and symmetric distal pulses are present in all extremities. No murmurs, rubs, or gallops. Respiratory: Normal respiratory effort without tachypnea nor retractions. Breath sounds are clear and equal bilaterally. No wheezes/rales/rhonchi. Gastrointestinal: Soft and nontender. No distention. No abdominal bruits.  Musculoskeletal: Nontender with normal range of motion in all extremities. No joint effusions.  No lower extremity tenderness nor edema. Neurologic:  Normal speech and language. No gross focal neurologic deficits are appreciated. Speech is normal. No gait instability. Skin:  Skin is warm, dry and intact. No rash noted. Psychiatric: Positive for anxiety ____________________________________________  ED COURSE:  Pertinent labs & imaging results that were available during my care of the patient were reviewed by me and considered in my medical decision making (see chart for details). Patient is  in no acute distress, will receive IV fluids and IV Phenergan. ____________________________________________    LABS (pertinent positives/negatives)  Labs Reviewed  COMPREHENSIVE METABOLIC PANEL - Abnormal; Notable for the following:    CO2 21 (*)    Total Protein 8.2 (*)    Albumin 5.6 (*)    AST 45 (*)    ALT 65 (*)    Alkaline Phosphatase 172 (*)     Anion gap 16 (*)    All other components within normal limits  LIPASE, BLOOD  CBC  URINALYSIS COMPLETEWITH MICROSCOPIC ([facility] ONLY)   ____________________________________________  FINAL ASSESSMENT AND PLAN  Vomiting, chronic alcohol abuse  Plan: Patient with labs and imaging as dictated above. Patient likely viral etiology in addition to running out of his Phenergan and chronic alcoholism. He was given IV fluids and Phenergan as well as IV Ativan and Toradol for his headache. He is stable for outpatient follow-up with his doctor.   Emily Filbert, MD   Emily Filbert, MD 04/19/15 8638  Emily Filbert, MD 04/19/15 1022

## 2015-04-19 NOTE — ED Notes (Signed)
Patient in sub-wait, IV infusing.  Alert and oriented.

## 2015-04-19 NOTE — Discharge Instructions (Signed)

## 2015-04-19 NOTE — ED Notes (Signed)
AAOx3.  Skin warm and dry.  Moving all extremities equally and strong.  Ambulates with easy and steady gait.  NAD 

## 2015-06-27 ENCOUNTER — Other Ambulatory Visit: Payer: Self-pay | Admitting: Infectious Diseases

## 2015-06-27 DIAGNOSIS — B20 Human immunodeficiency virus [HIV] disease: Secondary | ICD-10-CM

## 2015-06-27 DIAGNOSIS — F1010 Alcohol abuse, uncomplicated: Secondary | ICD-10-CM

## 2015-06-27 DIAGNOSIS — R110 Nausea: Secondary | ICD-10-CM

## 2015-06-27 DIAGNOSIS — R7989 Other specified abnormal findings of blood chemistry: Secondary | ICD-10-CM

## 2015-06-27 DIAGNOSIS — R945 Abnormal results of liver function studies: Secondary | ICD-10-CM

## 2016-03-31 DIAGNOSIS — M87051 Idiopathic aseptic necrosis of right femur: Secondary | ICD-10-CM | POA: Insufficient documentation

## 2016-05-01 ENCOUNTER — Inpatient Hospital Stay: Admission: RE | Admit: 2016-05-01 | Payer: Medicare Other | Source: Ambulatory Visit

## 2016-05-01 ENCOUNTER — Encounter
Admission: RE | Admit: 2016-05-01 | Discharge: 2016-05-01 | Disposition: A | Discharge status: Home / Self Care | Payer: Medicare Other | Source: Ambulatory Visit | Attending: Orthopedic Surgery | Admitting: Orthopedic Surgery

## 2016-05-01 DIAGNOSIS — Z01818 Encounter for other preprocedural examination: Secondary | ICD-10-CM | POA: Insufficient documentation

## 2016-05-01 DIAGNOSIS — M87851 Other osteonecrosis, right femur: Secondary | ICD-10-CM | POA: Diagnosis not present

## 2016-05-01 DIAGNOSIS — B20 Human immunodeficiency virus [HIV] disease: Secondary | ICD-10-CM | POA: Insufficient documentation

## 2016-05-01 DIAGNOSIS — Z79899 Other long term (current) drug therapy: Secondary | ICD-10-CM | POA: Insufficient documentation

## 2016-05-01 HISTORY — DX: Major depressive disorder, single episode, unspecified: F32.9

## 2016-05-01 HISTORY — DX: Gastro-esophageal reflux disease without esophagitis: K21.9

## 2016-05-01 HISTORY — DX: Depression, unspecified: F32.A

## 2016-05-01 HISTORY — DX: Idiopathic aseptic necrosis of unspecified bone: M87.00

## 2016-05-01 LAB — COMPREHENSIVE METABOLIC PANEL
ALK PHOS: 299 U/L — AB (ref 38–126)
ALT: 492 U/L — AB (ref 17–63)
AST: 423 U/L — AB (ref 15–41)
Albumin: 4.8 g/dL (ref 3.5–5.0)
Anion gap: 8 (ref 5–15)
BUN: 11 mg/dL (ref 6–20)
CALCIUM: 8.9 mg/dL (ref 8.9–10.3)
CO2: 27 mmol/L (ref 22–32)
CREATININE: 0.77 mg/dL (ref 0.61–1.24)
Chloride: 102 mmol/L (ref 101–111)
GFR calc Af Amer: >60 mL/min (ref >60)
GFR calc non Af Amer: >60 mL/min (ref >60)
Glucose, Bld: 90 mg/dL (ref 65–99)
Potassium: 3.7 mmol/L (ref 3.5–5.1)
Sodium: 137 mmol/L (ref 135–145)
Total Bilirubin: 1.8 mg/dL — ABNORMAL HIGH (ref 0.3–1.2)
Total Protein: 7.8 g/dL (ref 6.5–8.1)

## 2016-05-01 LAB — PROTIME-INR
INR: 0.95
PROTHROMBIN TIME: 12.7 s (ref 11.4–15.2)

## 2016-05-01 LAB — URINALYSIS, ROUTINE W REFLEX MICROSCOPIC
Bilirubin Urine: NEGATIVE
GLUCOSE, UA: NEGATIVE mg/dL
Hgb urine dipstick: NEGATIVE
KETONES UR: 5 mg/dL — AB
LEUKOCYTES UA: NEGATIVE
NITRITE: NEGATIVE
PROTEIN: NEGATIVE mg/dL
Specific Gravity, Urine: 1.014 (ref 1.005–1.030)
pH: 6 (ref 5.0–8.0)

## 2016-05-01 LAB — SURGICAL PCR SCREEN
MRSA, PCR: NEGATIVE
Staphylococcus aureus: POSITIVE — AB

## 2016-05-01 LAB — CBC
HCT: 43.6 % (ref 40.0–52.0)
HEMOGLOBIN: 14.8 g/dL (ref 13.0–18.0)
MCH: 33.3 pg (ref 26.0–34.0)
MCHC: 34 g/dL (ref 32.0–36.0)
MCV: 98 fL (ref 80.0–100.0)
Platelets: 148 10*3/uL — ABNORMAL LOW (ref 150–440)
RBC: 4.45 MIL/uL (ref 4.40–5.90)
RDW: 13.7 % (ref 11.5–14.5)
WBC: 5.1 10*3/uL (ref 3.8–10.6)

## 2016-05-01 LAB — APTT: aPTT: 28 seconds (ref 24–36)

## 2016-05-01 LAB — SEDIMENTATION RATE: Sed Rate: 3 mm/hr (ref 0–20)

## 2016-05-01 LAB — TYPE AND SCREEN
ABO/RH(D): O POS
Antibody Screen: NEGATIVE

## 2016-05-01 LAB — C-REACTIVE PROTEIN: CRP: <0.8 mg/dL (ref <1.0)

## 2016-05-01 NOTE — Pre-Procedure Instructions (Signed)
Clearance from Dr Sampson Goon on chart.

## 2016-05-01 NOTE — Pre-Procedure Instructions (Signed)
Abnormal labs faxed to office and anesthesia notified.

## 2016-05-01 NOTE — Patient Instructions (Addendum)
  Your procedure is scheduled on: 05/15/16 Wed Report to Same Day Surgery 2nd floor medical mall [facility] Entrance-take elevator on left to 2nd floor.  Check in with surgery information desk.) To find out your arrival time please call [phone] between 1PM - 3PM on 05/14/16 Tues  Remember: Instructions that are not followed completely may result in serious medical risk, up to and including death, or upon the discretion of your surgeon and anesthesiologist your surgery may need to be rescheduled.    _x___ 1. Do not eat food or drink liquids after midnight. No gum chewing or hard candies.     __x__ 2. No Alcohol for 24 hours before or after surgery.   __x__3. No Smoking for 24 prior to surgery.   ____  4. Bring all medications with you on the day of surgery if instructed.    __x__ 5. Notify your doctor if there is any change in your medical condition     (cold, fever, infections).     Do not wear jewelry, make-up, hairpins, clips or nail polish.  Do not wear lotions, powders, or perfumes. You may wear deodorant.  Do not shave 48 hours prior to surgery. Men may shave face and neck.  Do not bring valuables to the hospital.    [facility] is not responsible for any belongings or valuables.               Contacts, dentures or bridgework may not be worn into surgery.  Leave your suitcase in the car. After surgery it may be brought to your room.  For patients admitted to the hospital, discharge time is determined by your treatment team.   Patients discharged the day of surgery will not be allowed to drive home.  You will need someone to drive you home and stay with you the night of your procedure.    Please read over the following fact sheets that you were given:   [facility] Preparing for Surgery and or MRSA Information   _x___ Take these medicines the morning of surgery with A SIP OF WATER:    1. dolutegravir (TIVICAY  2.emtricitabine-tenofovir (TRUVADA)    3.HYDROcodone-acetaminophen   4.  5.  6.  ____Fleets enema or Magnesium Citrate as directed.   _x___ Use CHG Soap or sage wipes as directed on instruction sheet   ____ Use inhalers on the day of surgery and bring to hospital day of surgery  ____ Stop metformin 2 days prior to surgery    ____ Take 1/2 of usual insulin dose the night before surgery and none on the morning of           surgery.   ____ Stop Aspirin, Coumadin, Pllavix ,Eliquis, Effient, or Pradaxa  x__ Stop Anti-inflammatories such as Advil, Aleve, Ibuprofen, Motrin, Naproxen,          Naprosyn, Goodies powders or aspirin products. Ok to take Tylenol.   ____ Stop supplements until after surgery.    ____ Bring C-Pap to the hospital.

## 2016-05-02 LAB — URINE CULTURE
CULTURE: NO GROWTH
SPECIAL REQUESTS: NORMAL

## 2016-05-06 NOTE — Pre-Procedure Instructions (Signed)
LABS WITH ELEVATED LFT AND POSITIVE STAPH NASAL SWAB FAXED TO DR HOOTEN ,LM FOR TIFFANY,LABS NEED TO BE SENT TO DR Sampson Goon

## 2016-05-14 MED ORDER — CLINDAMYCIN PHOSPHATE 900 MG/50ML IV SOLN
900.0000 mg | INTRAVENOUS | Status: AC
  Administered 2016-05-15: 900 mg via INTRAVENOUS

## 2016-05-14 MED ORDER — FAMOTIDINE 20 MG PO TABS
20.0000 mg | ORAL_TABLET | Freq: Once | ORAL | Status: AC
  Administered 2016-05-15: 20 mg via ORAL

## 2016-05-14 MED ORDER — TRANEXAMIC ACID 1000 MG/10ML IV SOLN
1000.0000 mg | INTRAVENOUS | Status: AC
  Administered 2016-05-15: 1000 mg via INTRAVENOUS
  Filled 2016-05-14: qty 10

## 2016-05-14 NOTE — Pre-Procedure Instructions (Signed)
CLEARED BY DR Sampson Goon 05/13/16. LFT HAVE NORMALIZED

## 2016-05-15 ENCOUNTER — Inpatient Hospital Stay: Payer: Medicare Other | Admitting: Anesthesiology

## 2016-05-15 ENCOUNTER — Encounter
Admission: RE | Disposition: A | Discharge status: Home Health | Payer: Self-pay | Source: Ambulatory Visit | Attending: Orthopedic Surgery

## 2016-05-15 ENCOUNTER — Inpatient Hospital Stay
Admission: RE | Admit: 2016-05-15 | Discharge: 2016-05-17 | DRG: 469 | Disposition: A | Discharge status: Home Health | Payer: Medicare Other | Source: Ambulatory Visit | Attending: Orthopedic Surgery | Admitting: Orthopedic Surgery

## 2016-05-15 ENCOUNTER — Inpatient Hospital Stay: Payer: Medicare Other

## 2016-05-15 ENCOUNTER — Encounter: Payer: Self-pay | Admitting: *Deleted

## 2016-05-15 DIAGNOSIS — F1010 Alcohol abuse, uncomplicated: Secondary | ICD-10-CM | POA: Insufficient documentation

## 2016-05-15 DIAGNOSIS — M1611 Unilateral primary osteoarthritis, right hip: Secondary | ICD-10-CM | POA: Diagnosis present

## 2016-05-15 DIAGNOSIS — Z79899 Other long term (current) drug therapy: Secondary | ICD-10-CM | POA: Diagnosis not present

## 2016-05-15 DIAGNOSIS — K219 Gastro-esophageal reflux disease without esophagitis: Secondary | ICD-10-CM | POA: Diagnosis present

## 2016-05-15 DIAGNOSIS — B20 Human immunodeficiency virus [HIV] disease: Secondary | ICD-10-CM | POA: Diagnosis present

## 2016-05-15 DIAGNOSIS — F32A Depression, unspecified: Secondary | ICD-10-CM | POA: Insufficient documentation

## 2016-05-15 DIAGNOSIS — F329 Major depressive disorder, single episode, unspecified: Secondary | ICD-10-CM | POA: Diagnosis present

## 2016-05-15 DIAGNOSIS — M879 Osteonecrosis, unspecified: Secondary | ICD-10-CM | POA: Diagnosis present

## 2016-05-15 DIAGNOSIS — M25551 Pain in right hip: Secondary | ICD-10-CM | POA: Diagnosis present

## 2016-05-15 DIAGNOSIS — Z96649 Presence of unspecified artificial hip joint: Secondary | ICD-10-CM

## 2016-05-15 HISTORY — PX: TOTAL HIP ARTHROPLASTY: SHX124

## 2016-05-15 LAB — URINE DRUG SCREEN, QUALITATIVE (ARMC ONLY)
AMPHETAMINES, UR SCREEN: NOT DETECTED
Barbiturates, Ur Screen: NOT DETECTED
Benzodiazepine, Ur Scrn: NOT DETECTED
CANNABINOID 50 NG, UR ~~LOC~~: POSITIVE — AB
COCAINE METABOLITE, UR ~~LOC~~: NOT DETECTED
MDMA (ECSTASY) UR SCREEN: NOT DETECTED
Methadone Scn, Ur: NOT DETECTED
OPIATE, UR SCREEN: POSITIVE — AB
PHENCYCLIDINE (PCP) UR S: NOT DETECTED
Tricyclic, Ur Screen: NOT DETECTED

## 2016-05-15 LAB — ABO/RH: ABO/RH(D): O POS

## 2016-05-15 SURGERY — ARTHROPLASTY, HIP, TOTAL,POSTERIOR APPROACH
Anesthesia: Spinal | Laterality: Right | Wound class: Clean

## 2016-05-15 MED ORDER — METOCLOPRAMIDE HCL 10 MG PO TABS
10.0000 mg | ORAL_TABLET | Freq: Three times a day (TID) | ORAL | Status: DC
  Administered 2016-05-15 – 2016-05-16 (×4): 10 mg via ORAL
  Filled 2016-05-15 (×4): qty 1

## 2016-05-15 MED ORDER — ENOXAPARIN SODIUM 30 MG/0.3ML ~~LOC~~ SOLN
30.0000 mg | Freq: Two times a day (BID) | SUBCUTANEOUS | Status: DC
  Administered 2016-05-16 – 2016-05-17 (×3): 30 mg via SUBCUTANEOUS
  Filled 2016-05-15 (×3): qty 0.3

## 2016-05-15 MED ORDER — EMTRICITABINE-TENOFOVIR AF 200-25 MG PO TABS
1.0000 | ORAL_TABLET | Freq: Every day | ORAL | Status: DC
  Administered 2016-05-15 – 2016-05-16 (×2): 1 via ORAL
  Filled 2016-05-15 (×2): qty 1

## 2016-05-15 MED ORDER — FLEET ENEMA 7-19 GM/118ML RE ENEM: 1.0000 | ENEMA | Freq: Once | RECTAL | Status: DC | PRN

## 2016-05-15 MED ORDER — PROPOFOL 500 MG/50ML IV EMUL
INTRAVENOUS | Status: DC | PRN
  Administered 2016-05-15: 120 ug/kg/min via INTRAVENOUS

## 2016-05-15 MED ORDER — DIPHENHYDRAMINE HCL 12.5 MG/5ML PO ELIX
12.5000 mg | ORAL_SOLUTION | ORAL | Status: DC | PRN
  Administered 2016-05-16: 25 mg via ORAL
  Filled 2016-05-15: qty 10

## 2016-05-15 MED ORDER — ACETAMINOPHEN 10 MG/ML IV SOLN
1000.0000 mg | Freq: Four times a day (QID) | INTRAVENOUS | Status: AC
  Administered 2016-05-15 – 2016-05-16 (×3): 1000 mg via INTRAVENOUS
  Filled 2016-05-15 (×4): qty 100

## 2016-05-15 MED ORDER — ONDANSETRON HCL 4 MG/2ML IJ SOLN: 4.0000 mg | Freq: Four times a day (QID) | INTRAMUSCULAR | Status: DC | PRN

## 2016-05-15 MED ORDER — PNEUMOCOCCAL VAC POLYVALENT 25 MCG/0.5ML IJ INJ: 0.5000 mL | INJECTION | INTRAMUSCULAR | Status: DC

## 2016-05-15 MED ORDER — PROMETHAZINE HCL 25 MG PO TABS
25.0000 mg | ORAL_TABLET | ORAL | Status: DC | PRN
  Administered 2016-05-15 – 2016-05-16 (×2): 25 mg via ORAL
  Filled 2016-05-15 (×2): qty 1

## 2016-05-15 MED ORDER — TETRACAINE HCL 1 % IJ SOLN
INTRAMUSCULAR | Status: AC
  Filled 2016-05-15: qty 2

## 2016-05-15 MED ORDER — PHENYLEPHRINE HCL 10 MG/ML IJ SOLN
INTRAMUSCULAR | Status: DC | PRN
  Administered 2016-05-15: 150 ug via INTRAVENOUS
  Administered 2016-05-15: 100 ug via INTRAVENOUS

## 2016-05-15 MED ORDER — PROMETHAZINE HCL 25 MG/ML IJ SOLN
INTRAMUSCULAR | Status: AC
  Filled 2016-05-15: qty 1

## 2016-05-15 MED ORDER — MORPHINE SULFATE (PF) 2 MG/ML IV SOLN
2.0000 mg | INTRAVENOUS | Status: DC | PRN
  Administered 2016-05-15 – 2016-05-16 (×5): 2 mg via INTRAVENOUS
  Filled 2016-05-15 (×5): qty 1

## 2016-05-15 MED ORDER — FENTANYL CITRATE (PF) 100 MCG/2ML IJ SOLN
INTRAMUSCULAR | Status: DC | PRN
  Administered 2016-05-15 (×2): 50 ug via INTRAVENOUS

## 2016-05-15 MED ORDER — ONDANSETRON HCL 4 MG PO TABS
4.0000 mg | ORAL_TABLET | Freq: Four times a day (QID) | ORAL | Status: DC | PRN
Start: 2016-05-15 — End: 2016-05-17

## 2016-05-15 MED ORDER — AZITHROMYCIN 600 MG PO TABS: 600.0000 mg | ORAL_TABLET | ORAL | Status: DC

## 2016-05-15 MED ORDER — TRAMADOL HCL 50 MG PO TABS
50.0000 mg | ORAL_TABLET | ORAL | Status: DC | PRN
  Administered 2016-05-15 – 2016-05-17 (×6): 100 mg via ORAL
  Filled 2016-05-15 (×6): qty 2

## 2016-05-15 MED ORDER — ACETAMINOPHEN 10 MG/ML IV SOLN
INTRAVENOUS | Status: DC | PRN
  Administered 2016-05-15: 1000 mg via INTRAVENOUS

## 2016-05-15 MED ORDER — KETAMINE HCL 10 MG/ML IJ SOLN
INTRAMUSCULAR | Status: AC
  Filled 2016-05-15: qty 1

## 2016-05-15 MED ORDER — PROPOFOL 500 MG/50ML IV EMUL
INTRAVENOUS | Status: AC
  Filled 2016-05-15: qty 50

## 2016-05-15 MED ORDER — FERROUS SULFATE 325 (65 FE) MG PO TABS
325.0000 mg | ORAL_TABLET | Freq: Two times a day (BID) | ORAL | Status: DC
  Administered 2016-05-16 – 2016-05-17 (×3): 325 mg via ORAL
  Filled 2016-05-15 (×3): qty 1

## 2016-05-15 MED ORDER — MAGNESIUM HYDROXIDE 400 MG/5ML PO SUSP
30.0000 mL | Freq: Every day | ORAL | Status: DC | PRN
  Administered 2016-05-17: 30 mL via ORAL
  Filled 2016-05-15: qty 30

## 2016-05-15 MED ORDER — FENTANYL CITRATE (PF) 100 MCG/2ML IJ SOLN
25.0000 ug | INTRAMUSCULAR | Status: DC | PRN
  Administered 2016-05-15 (×2): 25 ug via INTRAVENOUS

## 2016-05-15 MED ORDER — OXYCODONE HCL 5 MG PO TABS
5.0000 mg | ORAL_TABLET | ORAL | Status: DC | PRN
  Administered 2016-05-15: 5 mg via ORAL
  Administered 2016-05-15 – 2016-05-17 (×9): 10 mg via ORAL
  Filled 2016-05-15 (×4): qty 2
  Filled 2016-05-15: qty 1
  Filled 2016-05-15 (×5): qty 2

## 2016-05-15 MED ORDER — SODIUM CHLORIDE 0.9 % IV SOLN
1000.0000 mg | Freq: Once | INTRAVENOUS | Status: AC
  Administered 2016-05-15: 1000 mg via INTRAVENOUS
  Filled 2016-05-15: qty 10

## 2016-05-15 MED ORDER — KETAMINE HCL 100 MG/ML IJ SOLN
INTRAMUSCULAR | Status: DC | PRN
  Administered 2016-05-15: 12 ug/kg/min via INTRAVENOUS

## 2016-05-15 MED ORDER — ALUM & MAG HYDROXIDE-SIMETH 200-200-20 MG/5ML PO SUSP: 30.0000 mL | ORAL | Status: DC | PRN

## 2016-05-15 MED ORDER — SENNOSIDES-DOCUSATE SODIUM 8.6-50 MG PO TABS
1.0000 | ORAL_TABLET | Freq: Two times a day (BID) | ORAL | Status: DC
  Administered 2016-05-15 – 2016-05-16 (×3): 1 via ORAL
  Filled 2016-05-15 (×3): qty 1

## 2016-05-15 MED ORDER — FAMOTIDINE 20 MG PO TABS
ORAL_TABLET | ORAL | Status: AC
  Administered 2016-05-15: 20 mg via ORAL
  Filled 2016-05-15: qty 1

## 2016-05-15 MED ORDER — SODIUM CHLORIDE 0.9 % IJ SOLN
INTRAMUSCULAR | Status: AC
  Administered 2016-05-15: 1000 mL
  Filled 2016-05-15: qty 10

## 2016-05-15 MED ORDER — ACETAMINOPHEN 10 MG/ML IV SOLN
INTRAVENOUS | Status: AC
  Filled 2016-05-15: qty 100

## 2016-05-15 MED ORDER — NEOMYCIN-POLYMYXIN B GU 40-200000 IR SOLN
Status: DC | PRN
  Administered 2016-05-15: 14 mL

## 2016-05-15 MED ORDER — TETRACAINE HCL 1 % IJ SOLN
INTRAMUSCULAR | Status: DC | PRN
  Administered 2016-05-15: 10 mg via INTRASPINAL

## 2016-05-15 MED ORDER — PHENOL 1.4 % MT LIQD
1.0000 | OROMUCOSAL | Status: DC | PRN
  Filled 2016-05-15: qty 177

## 2016-05-15 MED ORDER — ACETAMINOPHEN 650 MG RE SUPP: 650.0000 mg | Freq: Four times a day (QID) | RECTAL | Status: DC | PRN

## 2016-05-15 MED ORDER — KETAMINE HCL 50 MG/ML IJ SOLN
INTRAMUSCULAR | Status: DC | PRN
  Administered 2016-05-15: 5 mg via INTRAMUSCULAR

## 2016-05-15 MED ORDER — SODIUM CHLORIDE 0.9 % IV SOLN
INTRAVENOUS | Status: DC | PRN
  Administered 2016-05-15: 20 ug/min via INTRAVENOUS

## 2016-05-15 MED ORDER — GLYCOPYRROLATE 0.2 MG/ML IJ SOLN
INTRAMUSCULAR | Status: AC
  Filled 2016-05-15: qty 1

## 2016-05-15 MED ORDER — DOLUTEGRAVIR SODIUM 50 MG PO TABS
50.0000 mg | ORAL_TABLET | Freq: Two times a day (BID) | ORAL | Status: DC
  Administered 2016-05-15 – 2016-05-16 (×3): 50 mg via ORAL
  Filled 2016-05-15 (×4): qty 1

## 2016-05-15 MED ORDER — CLINDAMYCIN PHOSPHATE 600 MG/50ML IV SOLN
600.0000 mg | Freq: Four times a day (QID) | INTRAVENOUS | Status: AC
  Administered 2016-05-15 – 2016-05-16 (×4): 600 mg via INTRAVENOUS
  Filled 2016-05-15 (×5): qty 50

## 2016-05-15 MED ORDER — FENTANYL CITRATE (PF) 100 MCG/2ML IJ SOLN
INTRAMUSCULAR | Status: AC
  Filled 2016-05-15: qty 2

## 2016-05-15 MED ORDER — FENTANYL CITRATE (PF) 100 MCG/2ML IJ SOLN
INTRAMUSCULAR | Status: AC
  Administered 2016-05-15: 25 ug via INTRAVENOUS
  Filled 2016-05-15: qty 2

## 2016-05-15 MED ORDER — CLINDAMYCIN PHOSPHATE 900 MG/50ML IV SOLN
INTRAVENOUS | Status: AC
  Filled 2016-05-15: qty 50

## 2016-05-15 MED ORDER — PROPOFOL 10 MG/ML IV BOLUS
INTRAVENOUS | Status: DC | PRN
  Administered 2016-05-15: 50 mg via INTRAVENOUS

## 2016-05-15 MED ORDER — LACTATED RINGERS IV SOLN
Freq: Once | INTRAVENOUS | Status: AC
  Administered 2016-05-15: 12:00:00 via INTRAVENOUS

## 2016-05-15 MED ORDER — OXYCODONE HCL 5 MG PO TABS: 5.0000 mg | ORAL_TABLET | Freq: Once | ORAL | Status: DC | PRN

## 2016-05-15 MED ORDER — GLYCOPYRROLATE 0.2 MG/ML IJ SOLN
INTRAMUSCULAR | Status: DC | PRN
  Administered 2016-05-15: 0.2 mg via INTRAVENOUS

## 2016-05-15 MED ORDER — ACETAMINOPHEN 325 MG PO TABS: 650.0000 mg | ORAL_TABLET | Freq: Four times a day (QID) | ORAL | Status: DC | PRN

## 2016-05-15 MED ORDER — LACTATED RINGERS IV SOLN
INTRAVENOUS | Status: DC
  Administered 2016-05-15: 50 mL/h via INTRAVENOUS
  Administered 2016-05-15: 10:00:00 via INTRAVENOUS

## 2016-05-15 MED ORDER — BISACODYL 10 MG RE SUPP: 10.0000 mg | Freq: Every day | RECTAL | Status: DC | PRN

## 2016-05-15 MED ORDER — NEOMYCIN-POLYMYXIN B GU 40-200000 IR SOLN
Status: AC
  Filled 2016-05-15: qty 20

## 2016-05-15 MED ORDER — PANTOPRAZOLE SODIUM 40 MG PO TBEC
40.0000 mg | DELAYED_RELEASE_TABLET | Freq: Two times a day (BID) | ORAL | Status: DC
  Administered 2016-05-15 – 2016-05-16 (×3): 40 mg via ORAL
  Filled 2016-05-15 (×3): qty 1

## 2016-05-15 MED ORDER — OXYCODONE HCL 5 MG/5ML PO SOLN: 5.0000 mg | Freq: Once | ORAL | Status: DC | PRN

## 2016-05-15 MED ORDER — BUPIVACAINE HCL (PF) 0.5 % IJ SOLN
INTRAMUSCULAR | Status: DC | PRN
  Administered 2016-05-15: 2 mL

## 2016-05-15 MED ORDER — MENTHOL 3 MG MT LOZG
1.0000 | LOZENGE | OROMUCOSAL | Status: DC | PRN
  Filled 2016-05-15: qty 9

## 2016-05-15 MED ORDER — SODIUM CHLORIDE 0.9 % IV SOLN
INTRAVENOUS | Status: DC
  Administered 2016-05-15 – 2016-05-16 (×2): via INTRAVENOUS

## 2016-05-15 MED ORDER — PROMETHAZINE HCL 25 MG/ML IJ SOLN
6.2500 mg | INTRAMUSCULAR | Status: DC | PRN
  Administered 2016-05-15: 6.25 mg via INTRAVENOUS

## 2016-05-15 MED ORDER — MIDAZOLAM HCL 5 MG/5ML IJ SOLN
INTRAMUSCULAR | Status: AC
  Filled 2016-05-15: qty 5

## 2016-05-15 MED ORDER — MEPERIDINE HCL 50 MG/ML IJ SOLN: 6.2500 mg | INTRAMUSCULAR | Status: DC | PRN

## 2016-05-15 MED ORDER — MIDAZOLAM HCL 5 MG/5ML IJ SOLN
INTRAMUSCULAR | Status: DC | PRN
  Administered 2016-05-15: 3 mg via INTRAVENOUS
  Administered 2016-05-15: 2 mg via INTRAVENOUS

## 2016-05-15 MED ORDER — CHLORHEXIDINE GLUCONATE 4 % EX LIQD: 60.0000 mL | Freq: Once | CUTANEOUS | Status: DC

## 2016-05-15 SURGICAL SUPPLY — 56 items
BLADE DRUM FLTD (BLADE) ×3 IMPLANT
BLADE SAW 1 (BLADE) ×3 IMPLANT
CANISTER SUCT 1200ML W/VALVE (MISCELLANEOUS) ×3 IMPLANT
CANISTER SUCT 3000ML (MISCELLANEOUS) ×6 IMPLANT
CAPT HIP TOTAL 2 ×3 IMPLANT
CARTRIDGE OIL MAESTRO DRILL (MISCELLANEOUS) ×1 IMPLANT
CATH FOL LEG HOLDER (MISCELLANEOUS) ×3 IMPLANT
CATH TRAY METER 16FR LF (MISCELLANEOUS) ×3 IMPLANT
DIFFUSER MAESTRO (MISCELLANEOUS) ×3 IMPLANT
DRAPE INCISE IOBAN 66X60 STRL (DRAPES) ×3 IMPLANT
DRAPE SHEET LG 3/4 BI-LAMINATE (DRAPES) ×3 IMPLANT
DRSG DERMACEA 8X12 NADH (GAUZE/BANDAGES/DRESSINGS) ×3 IMPLANT
DRSG OPSITE POSTOP 3X4 (GAUZE/BANDAGES/DRESSINGS) ×3 IMPLANT
DRSG OPSITE POSTOP 4X12 (GAUZE/BANDAGES/DRESSINGS) ×3 IMPLANT
DRSG OPSITE POSTOP 4X14 (GAUZE/BANDAGES/DRESSINGS) ×3 IMPLANT
DRSG TEGADERM 4X4.75 (GAUZE/BANDAGES/DRESSINGS) ×3 IMPLANT
DURAPREP 26ML APPLICATOR (WOUND CARE) ×3 IMPLANT
ELECT BLADE 6.5 EXT (BLADE) ×3 IMPLANT
ELECT CAUTERY BLADE 6.4 (BLADE) ×3 IMPLANT
GLOVE BIO SURGEON STRL SZ7 (GLOVE) ×3 IMPLANT
GLOVE BIOGEL M STRL SZ7.5 (GLOVE) ×6 IMPLANT
GLOVE BIOGEL PI IND STRL 7.5 (GLOVE) ×1 IMPLANT
GLOVE BIOGEL PI IND STRL 9 (GLOVE) ×1 IMPLANT
GLOVE BIOGEL PI INDICATOR 7.5 (GLOVE) ×2
GLOVE BIOGEL PI INDICATOR 9 (GLOVE) ×2
GLOVE INDICATOR 8.0 STRL GRN (GLOVE) ×3 IMPLANT
GLOVE SURG SYN 9.0  PF PI (GLOVE) ×2
GLOVE SURG SYN 9.0 PF PI (GLOVE) ×1 IMPLANT
GOWN STRL REUS W/ TWL LRG LVL3 (GOWN DISPOSABLE) ×3 IMPLANT
GOWN STRL REUS W/TWL 2XL LVL3 (GOWN DISPOSABLE) ×3 IMPLANT
GOWN STRL REUS W/TWL LRG LVL3 (GOWN DISPOSABLE) ×6
HANDPIECE INTERPULSE COAX TIP (DISPOSABLE) ×2
HEMOVAC 400CC 10FR (MISCELLANEOUS) ×3 IMPLANT
HOOD PEEL AWAY FLYTE STAYCOOL (MISCELLANEOUS) ×6 IMPLANT
KIT RM TURNOVER STRD PROC AR (KITS) ×3 IMPLANT
NDL SAFETY 18GX1.5 (NEEDLE) ×3 IMPLANT
NS IRRIG 500ML POUR BTL (IV SOLUTION) ×3 IMPLANT
OIL CARTRIDGE MAESTRO DRILL (MISCELLANEOUS) ×3
PACK HIP PROSTHESIS (MISCELLANEOUS) ×3 IMPLANT
PIN STEIN THRED 5/32 (Pin) ×3 IMPLANT
SET HNDPC FAN SPRY TIP SCT (DISPOSABLE) ×1 IMPLANT
SOL .9 NS 3000ML IRR  AL (IV SOLUTION) ×2
SOL .9 NS 3000ML IRR UROMATIC (IV SOLUTION) ×1 IMPLANT
SOL PREP PVP 2OZ (MISCELLANEOUS) ×3
SOLUTION PREP PVP 2OZ (MISCELLANEOUS) ×1 IMPLANT
SPONGE DRAIN TRACH 4X4 STRL 2S (GAUZE/BANDAGES/DRESSINGS) ×3 IMPLANT
STAPLER SKIN PROX 35W (STAPLE) ×3 IMPLANT
SUT ETHIBOND #5 BRAIDED 30INL (SUTURE) ×3 IMPLANT
SUT VIC AB 0 CT1 36 (SUTURE) ×3 IMPLANT
SUT VIC AB 1 CT1 36 (SUTURE) ×6 IMPLANT
SUT VIC AB 2-0 CT1 27 (SUTURE) ×2
SUT VIC AB 2-0 CT1 TAPERPNT 27 (SUTURE) ×1 IMPLANT
SYR 20CC LL (SYRINGE) ×3 IMPLANT
TAPE ADH 3 LX (MISCELLANEOUS) ×3 IMPLANT
TAPE TRANSPORE STRL 2 31045 (GAUZE/BANDAGES/DRESSINGS) ×3 IMPLANT
TOWEL OR 17X26 4PK STRL BLUE (TOWEL DISPOSABLE) ×3 IMPLANT

## 2016-05-15 NOTE — Anesthesia Procedure Notes (Signed)
Spinal  Patient location during procedure: OR Start time: 05/15/2016 8:04 AM End time: 05/15/2016 8:07 AM Staffing Anesthesiologist: Emmie Niemann Performed: anesthesiologist  Preanesthetic Checklist Completed: patient identified, site marked, surgical consent, pre-op evaluation, timeout performed, IV checked, risks and benefits discussed and monitors and equipment checked Spinal Block Patient position: sitting Prep: ChloraPrep Patient monitoring: heart rate, continuous pulse ox, blood pressure and cardiac monitor Approach: midline Location: L4-5 Injection technique: single-shot Needle Needle type: Introducer and Pencil-Tip  Needle gauge: 24 G Needle length: 9 cm Additional Notes Negative paresthesia. Negative blood return. Positive free-flowing CSF. Expiration date of kit checked and confirmed. Patient tolerated procedure well, without complications.

## 2016-05-15 NOTE — Transfer of Care (Signed)
Immediate Anesthesia Transfer of Care Note  Patient: Roy Trujillo  Procedure(s) Performed: Procedure(s): TOTAL HIP ARTHROPLASTY (Right)  Patient Location: PACU  Anesthesia Type:Spinal  Level of Consciousness: sedated  Airway & Oxygen Therapy: Patient Spontanous Breathing and Patient connected to nasal cannula oxygen  Post-op Assessment: Report given to RN and Post -op Vital signs reviewed and stable  Post vital signs: Reviewed and stable  Last Vitals:  Vitals:   05/15/16 0623  BP: (!) 153/88  Pulse: 87  Resp: 16  Temp: 36.6 C    Last Pain:  Vitals:   05/15/16 0623  TempSrc: Tympanic  PainSc: 6       Patients Stated Pain Goal: 1 (05/15/16 8338)  Complications: No apparent anesthesia complications

## 2016-05-15 NOTE — NC FL2 (Signed)
[place] MEDICAID FL2 LEVEL OF CARE SCREENING TOOL     IDENTIFICATION  Patient Name: Roy Trujillo Birthdate: Feb 21, 1966 Sex: male Admission Date (Current Location): 05/15/2016  [facility] and [place] Number:  Chiropodist and Address:  [facility], [address], [place], [place] [address]      Provider Number: [id number]  Attending Physician Name and Address:  Donato Heinz, MD  Relative Name and Phone Number:       Current Level of Care: Hospital Recommended Level of Care: Skilled Nursing Facility Prior Approval Number:    Date Approved/Denied:   PASRR Number:    Discharge Plan: SNF    Current Diagnoses: Patient Active Problem List   Diagnosis Date Noted  . Alcohol abuse 05/15/2016  . Depression 05/15/2016  . 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 Avascular necrosis of bone of hip, right (HCC) 03/31/2016  . HIV infection (HCC) 03/04/1996    Orientation RESPIRATION BLADDER Height & Weight     Self, Time, Situation, Place  Normal Continent Weight: 125 lb (56.7 kg) Height:  5\' 6"  (167.6 cm)  BEHAVIORAL SYMPTOMS/MOOD NEUROLOGICAL BOWEL NUTRITION STATUS   (none)  (none) Continent Diet (Diet: Clear Liquid )  AMBULATORY STATUS COMMUNICATION OF NEEDS Skin   Extensive Assist Verbally Surgical wounds (Incision: Right Hip. )                       Personal Care Assistance Level of Assistance  Bathing, Feeding, Dressing Bathing Assistance: Limited assistance Feeding assistance: Independent Dressing Assistance: Limited assistance     Functional Limitations Info  Sight, Hearing, Speech Sight Info: Adequate Hearing Info: Adequate Speech Info: Adequate    SPECIAL CARE FACTORS FREQUENCY  PT (By licensed PT), OT (By licensed OT)     PT Frequency:  (5) OT Frequency:  (5)            Contractures      Additional Factors Info  Code Status, Allergies Code Status Info:  (Full Code. ) Allergies Info:   (Penicillins, Bactrim Sulfamethoxazole-trimethoprim, Zerit Stavudine )           Current Medications (05/15/2016):  This is the current hospital active medication list Current Facility-Administered Medications  Medication Dose Route Frequency Provider Last Rate Last Dose  . 0.9 %  sodium chloride infusion   Intravenous Continuous 05/17/2016, MD 100 mL/hr at 05/15/16 1414    . acetaminophen (OFIRMEV) IV 1,000 mg  1,000 mg Intravenous Q6H 05/17/16, MD   1,000 mg at 05/15/16 1413  . [START ON 05/16/2016] acetaminophen (TYLENOL) tablet 650 mg  650 mg Oral Q6H PRN 05/18/2016, MD       Or  . Donato Heinz ON 05/16/2016] acetaminophen (TYLENOL) suppository 650 mg  650 mg Rectal Q6H PRN 05/18/2016, MD      . alum & mag hydroxide-simeth (MAALOX/MYLANTA) 200-200-20 MG/5ML suspension 30 mL  30 mL Oral Q4H PRN 09-10-2000, MD      . Donato Heinz ON 05/20/2016] azithromycin (ZITHROMAX) tablet 600 mg  600 mg Oral Weekly 05/22/2016, MD      . bisacodyl (DULCOLAX) suppository 10 mg  10 mg Rectal Daily PRN Donato Heinz, MD      . clindamycin (CLEOCIN) IVPB 600 mg  600 mg Intravenous Q6H Donato Heinz, MD   600 mg at 05/15/16 1413  . diphenhydrAMINE (BENADRYL) 12.5 MG/5ML elixir 12.5-25 mg  12.5-25 mg Oral Q4H PRN  Donato Heinz, MD      . dolutegravir (TIVICAY) tablet 50 mg  50 mg Oral BID Donato Heinz, MD      . emtricitabine-tenofovir AF (DESCOVY) 200-25 MG per tablet 1 tablet  1 tablet Oral Daily Donato Heinz, MD      . Melene Muller ON 05/16/2016] enoxaparin (LOVENOX) injection 30 mg  30 mg Subcutaneous Q12H Donato Heinz, MD      . ferrous sulfate tablet 325 mg  325 mg Oral BID WC Donato Heinz, MD      . magnesium hydroxide (MILK OF MAGNESIA) suspension 30 mL  30 mL Oral Daily PRN Donato Heinz, MD      . menthol-cetylpyridinium (CEPACOL) lozenge 3 mg  1 lozenge Oral PRN Donato Heinz, MD       Or  . phenol (CHLORASEPTIC) mouth spray 1 spray  1 spray Mouth/Throat PRN Donato Heinz, MD       . metoCLOPramide (REGLAN) tablet 10 mg  10 mg Oral TID AC & HS Donato Heinz, MD      . morphine 2 MG/ML injection 2 mg  2 mg Intravenous Q2H PRN Donato Heinz, MD   2 mg at 05/15/16 1439  . ondansetron (ZOFRAN) tablet 4 mg  4 mg Oral Q6H PRN Donato Heinz, MD       Or  . ondansetron (ZOFRAN) injection 4 mg  4 mg Intravenous Q6H PRN Donato Heinz, MD      . oxyCODONE (Oxy IR/ROXICODONE) immediate release tablet 5-10 mg  5-10 mg Oral Q4H PRN Donato Heinz, MD   5 mg at 05/15/16 1411  . pantoprazole (PROTONIX) EC tablet 40 mg  40 mg Oral BID Donato Heinz, MD      . Melene Muller ON 05/16/2016] pneumococcal 23 valent vaccine (PNU-IMMUNE) injection 0.5 mL  0.5 mL Intramuscular Tomorrow-1000 Donato Heinz, MD      . promethazine (PHENERGAN) 25 MG/ML injection           . promethazine (PHENERGAN) tablet 25 mg  25 mg Oral Q4H PRN Donato Heinz, MD      . senna-docusate (Senokot-S) tablet 1 tablet  1 tablet Oral BID Donato Heinz, MD      . sodium phosphate (FLEET) 7-19 GM/118ML enema 1 enema  1 enema Rectal Once PRN Donato Heinz, MD      . traMADol Janean Sark) tablet 50-100 mg  50-100 mg Oral Q4H PRN Donato Heinz, MD         Discharge Medications: Please see discharge summary for a list of discharge medications.  Relevant Imaging Results:  Relevant Lab Results:   Additional Information  (SSN: [id number])  Sample, Darleen Crocker, LCSW

## 2016-05-15 NOTE — Progress Notes (Signed)
Nausea better after phenergan 

## 2016-05-15 NOTE — Progress Notes (Signed)
Patient was admitted to room 160 from surgery via room bed. A&O x4. Husband at bedside. NSL, drain, TEDs and foot pumps in place. Pain 4/10. VSS. No feeling to operative leg. PPP. Bed alarm on for safety.

## 2016-05-15 NOTE — Anesthesia Post-op Follow-up Note (Cosign Needed)
Anesthesia QCDR form completed.        

## 2016-05-15 NOTE — Progress Notes (Signed)
Patient is A&O x4. Up with assist x 1 to chair, tolerated a short period of time. IV fluids infusing. Tolerated clear liquid diet. Pain control issues this shift, went between oral and IV. Bed.chair alarm on for safety. Husband at bedside. Hemovac has bloody drainage. Foley draining clear yellow urine.

## 2016-05-15 NOTE — Anesthesia Preprocedure Evaluation (Signed)
Anesthesia Evaluation  Patient identified by MRN, date of birth, ID band Patient awake    Reviewed: Allergy & Precautions, NPO status , Patient's Chart, lab work & pertinent test results  History of Anesthesia Complications Negative for: history of anesthetic complications  Airway Mallampati: II  TM Distance: >3 FB Neck ROM: Full    Dental  (+) Poor Dentition   Pulmonary neg pulmonary ROS, neg sleep apnea, neg COPD,    breath sounds clear to auscultation- rhonchi (-) wheezing      Cardiovascular Exercise Tolerance: Good (-) hypertension(-) CAD and (-) Past MI  Rhythm:Regular Rate:Normal - Systolic murmurs and - Diastolic murmurs    Neuro/Psych PSYCHIATRIC DISORDERS Depression    GI/Hepatic Neg liver ROS, GERD  ,  Endo/Other  negative endocrine ROSneg diabetes  Renal/GU negative Renal ROS     Musculoskeletal negative musculoskeletal ROS (+)   Abdominal (+) - obese,   Peds  Hematology negative hematology ROS (+)   Anesthesia Other Findings Past Medical History: No date: Avascular necrosis (HCC) No date: Depression No date: GERD (gastroesophageal reflux disease) No date: HIV (human immunodeficiency virus infection) (*   Reproductive/Obstetrics                             Anesthesia Physical Anesthesia Plan  ASA: II  Anesthesia Plan: Spinal   Post-op Pain Management:    Induction:   Airway Management Planned: Natural Airway  Additional Equipment:   Intra-op Plan:   Post-operative Plan:   Informed Consent: I have reviewed the patients History and Physical, chart, labs and discussed the procedure including the risks, benefits and alternatives for the proposed anesthesia with the patient or authorized representative who has indicated his/her understanding and acceptance.   Dental advisory given  Plan Discussed with: Anesthesiologist and CRNA  Anesthesia Plan Comments:          Lab Results  Component Value Date   WBC 5.1 05/01/2016   HGB 14.8 05/01/2016   HCT 43.6 05/01/2016   MCV 98.0 05/01/2016   PLT 148 (L) 05/01/2016    Anesthesia Quick Evaluation

## 2016-05-15 NOTE — Evaluation (Signed)
Physical Therapy Evaluation Patient Details Name: Roy Trujillo MRN: [id number] DOB: 01/26/1966 Today's Date: 05/15/2016   History of Present Illness  Pt admitted for R THR. Pt with history of L THR.  Clinical Impression  Pt is a pleasant 51 year old male who was admitted for R THR. Pt performs bed mobility with min assist, transfers with min assist, and ambulation with cga and RW. Pt educated on THR precautions. Pt demonstrates deficits with strength/pain/mobility. Pt is motivated to work with therapy. Would benefit from skilled PT to address above deficits and promote optimal return to PLOF. Recommend transition to HHPT upon discharge from acute hospitalization.       Follow Up Recommendations Home health PT    Equipment Recommendations  Rolling walker with 5" wheels;Wheelchair (measurements PT)    Recommendations for Other Services       Precautions / Restrictions Precautions Precautions: Fall;Posterior Hip Precaution Booklet Issued: No Restrictions Weight Bearing Restrictions: Yes RLE Weight Bearing: Weight bearing as tolerated      Mobility  Bed Mobility Overal bed mobility: Needs Assistance Bed Mobility: Supine to Sit     Supine to sit: Min assist     General bed mobility comments: assist for sliding B LEs off bed and following precautions. Once seated, pt able to sit with upright posture  Transfers Overall transfer level: Needs assistance Equipment used: Rolling walker (2 wheeled) Transfers: Sit to/from Stand Sit to Stand: Min assist         General transfer comment: assist for pushing from seated surface. Once standing, able to stand with upright posture  Ambulation/Gait Ambulation/Gait assistance: Min guard Ambulation Distance (Feet): 3 Feet Assistive device: Rolling walker (2 wheeled) Gait Pattern/deviations: Step-to pattern     General Gait Details: ambulated to recliner with short step length and limited WBing on R LE secondary to pain.  Pain increased with activity  Stairs            Wheelchair Mobility    Modified Rankin (Stroke Patients Only)       Balance Overall balance assessment: Needs assistance Sitting-balance support: Feet supported Sitting balance-Leahy Scale: Good     Standing balance support: Bilateral upper extremity supported Standing balance-Leahy Scale: Good                               Pertinent Vitals/Pain Pain Assessment: 0-10 Pain Score: 9  Pain Location: R hip Pain Descriptors / Indicators: Operative site guarding;Contraction Pain Intervention(s): Patient requesting pain meds-RN notified;Ice applied;Limited activity within patient's tolerance    Home Living Family/patient expects to be discharged to:: Private residence Living Arrangements: Spouse/significant other Available Help at Discharge: Family Type of Home: House Home Access: Stairs to enter Entrance Stairs-Rails: Right Entrance Stairs-Number of Steps: 3 Home Layout: One level Home Equipment: Cane - single point      Prior Function Level of Independence: Independent               Hand Dominance        Extremity/Trunk Assessment   Upper Extremity Assessment Upper Extremity Assessment: Overall WFL for tasks assessed    Lower Extremity Assessment Lower Extremity Assessment: Generalized weakness (R LE grossly 3+/5)       Communication   Communication: No difficulties  Cognition Arousal/Alertness: Awake/alert Behavior During Therapy: WFL for tasks assessed/performed Overall Cognitive Status: Within Functional Limits for tasks assessed  General Comments      Exercises Other Exercises Other Exercises: Supine ther-ex performed including R LE ankle pumps, quad sets, and hip abd/add. ALl ther-ex performed x 10 reps. Further ther-ex deferred secondary to increased pain. Min assist required   Assessment/Plan    PT Assessment Patient needs continued PT  services  PT Problem List Decreased strength;Decreased activity tolerance;Decreased balance;Decreased mobility;Pain       PT Treatment Interventions DME instruction;Gait training;Therapeutic activities;Therapeutic exercise    PT Goals (Current goals can be found in the Care Plan section)  Acute Rehab PT Goals Patient Stated Goal: to get stronger PT Goal Formulation: With patient Time For Goal Achievement: 05/29/16 Potential to Achieve Goals: Good    Frequency BID   Barriers to discharge        Co-evaluation               End of Session Equipment Utilized During Treatment: Gait belt Activity Tolerance: Patient limited by pain Patient left: in chair;with chair alarm set;with family/visitor present;with SCD's reapplied Nurse Communication: Mobility status PT Visit Diagnosis: Unsteadiness on feet (R26.81);Pain;Muscle weakness (generalized) (M62.81) Pain - Right/Left: Right Pain - part of body: Hip         Time: 3354-5625 PT Time Calculation (min) (ACUTE ONLY): 26 min   Charges:   PT Evaluation $PT Eval Moderate Complexity: 1 Procedure PT Treatments $Therapeutic Exercise: 8-22 mins   PT G Codes:         Janyla Biscoe May 30, 2016, 5:23 PM  Elizabeth Palau, PT, DPT [phone]

## 2016-05-15 NOTE — H&P (Signed)
The patient has been re-examined, and the chart reviewed, and there have been no interval changes to the documented history and physical.    The risks, benefits, and alternatives have been discussed at length. The patient expressed understanding of the risks benefits and agreed with plans for surgical intervention.  James P. Hooten, Jr. M.D.    

## 2016-05-15 NOTE — Op Note (Signed)
OPERATIVE NOTE  DATE OF SURGERY:  05/15/2016  PATIENT NAME:  Roy Trujillo   DOB: [birth year]  MRN: [id number]  PRE-OPERATIVE DIAGNOSIS: Degenerative arthrosis of the right hip, primary  POST-OPERATIVE DIAGNOSIS:  Same  PROCEDURE:  Right total hip arthroplasty  SURGEON:  Jena Gauss. M.D.  ASSISTANT:  Van Clines, PA (present and scrubbed throughout the case, critical for assistance with exposure, retraction, instrumentation, and closure)  ANESTHESIA: spinal  ESTIMATED BLOOD LOSS: 100 mL  FLUIDS REPLACED: 1950 mL of crystalloid  DRAINS: 2 medium drains to a Hemovac reservoir  IMPLANTS UTILIZED: DePuy 12 mm large stature AML femoral stem, 54 mm OD Pinnacle 100 acetabular component, neutral Pinnacle Marathon polyethylene insert, and a 36 mm M-SPEC +5 mm hip ball  INDICATIONS FOR SURGERY: Roy Trujillo is a 51 y.o. year old male with a long history of progressive hip and groin  pain. X-rays demonstrated severe degenerative changes. The patient had not seen any significant improvement despite conservative nonsurgical intervention. After discussion of the risks and benefits of surgical intervention, the patient expressed understanding of the risks benefits and agree with plans for total hip arthroplasty.   The risks, benefits, and alternatives were discussed at length including but not limited to the risks of infection, bleeding, nerve injury, stiffness, blood clots, the need for revision surgery, limb length inequality, dislocation, cardiopulmonary complications, among others, and they were willing to proceed.  PROCEDURE IN DETAIL: The patient was brought into the operating room and, after adequate spinal anesthesia was achieved, the patient was placed in a left lateral decubitus position. Axillary roll was placed and all bony prominences were well-padded. The patient's right hip was cleaned and prepped with alcohol and DuraPrep and draped in the usual sterile fashion. A  "timeout" was performed as per usual protocol. A lateral curvilinear incision was made gently curving towards the posterior superior iliac spine. The IT band was incised in line with the skin incision and the fibers of the gluteus maximus were split in line. The piriformis tendon was identified, skeletonized, and incised at its insertion to the proximal femur and reflected posteriorly. A T type posterior capsulotomy was performed. Prior to dislocation of the femoral head, a threaded Steinmann pin was inserted through a separate stab incision into the pelvis superior to the acetabulum and bent in the form of a stylus so as to assess limb length and hip offset throughout the procedure. The femoral head was then dislocated posteriorly. Inspection of the femoral head demonstrated severe degenerative changes with full-thickness loss of articular cartilage. The femoral neck cut was performed using an oscillating saw. The anterior capsule was elevated off of the femoral neck using a periosteal elevator. Attention was then directed to the acetabulum. The remnant of the labrum was excised using electrocautery. Inspection of the acetabulum also demonstrated significant degenerative changes. The acetabulum was reamed in sequential fashion up to a 53 mm diameter. Good punctate bleeding bone was encountered. A 54 mm Pinnacle 100 acetabular component was positioned and impacted into place. Good scratch fit was appreciated. A neutral polyethylene trial was inserted.  Attention was then directed to the proximal femur. A hole for reaming of the proximal femoral canal was created using a high-speed burr. The femoral canal was reamed in sequential fashion up to a 12 mm diameter. This allowed for approximately 6 cm of scratch fit. Serial broaches were inserted up to a 12 mm large stature femoral broach. Calcar region was planed and a trial reduction was performed  using a 36 mm hip ball with a +5 mm neck length. Good equalization of  limb lengths and hip offset was appreciated and excellent stability was noted both anteriorly and posteriorly. Trial components were removed. The acetabular shell was irrigated with copious amounts of normal saline with antibiotic solution and suctioned dry. A neutral Pinnacle Marathon polyethylene insert was positioned and impacted into place. Next, a 12 mm large stature AML femoral stem was positioned and impacted into place. Excellent scratch fit was appreciated. A trial reduction was again performed with a 36 mm hip ball with a +5 mm neck length. Again, good equalization of limb lengths was appreciated and excellent stability appreciated both anteriorly and posteriorly. The hip was then dislocated and the trial hip ball was removed. The Morse taper was cleaned and dried. A 36 mm M-SPEC hip ball with a +5 mm neck length was placed on the trunnion and impacted into place. The hip was then reduced and placed through range of motion. Excellent stability was appreciated both anteriorly and posteriorly.  The wound was irrigated with copious amounts of normal saline with antibiotic solution and suctioned dry. Good hemostasis was appreciated. The posterior capsulotomy was repaired using #5 Ethibond. Piriformis tendon was reapproximated to the undersurface of the gluteus medius tendon using #5 Ethibond. Two medium drains were placed in the wound bed and brought out through separate stab incisions to be attached to a Hemovac reservoir. The IT band was reapproximated using interrupted sutures of #1 Vicryl. Subcutaneous tissue was approximated using first #0 Vicryl followed by #2-0 Vicryl. The skin was closed with skin staples.  The patient tolerated the procedure well and was transported to the recovery room in stable condition.   Jena Gauss., M.D.

## 2016-05-16 LAB — BASIC METABOLIC PANEL
Anion gap: 5 (ref 5–15)
BUN: 6 mg/dL (ref 6–20)
CHLORIDE: 102 mmol/L (ref 101–111)
CO2: 28 mmol/L (ref 22–32)
CREATININE: 0.73 mg/dL (ref 0.61–1.24)
Calcium: 8.4 mg/dL — ABNORMAL LOW (ref 8.9–10.3)
GFR calc Af Amer: >60 mL/min (ref >60)
GFR calc non Af Amer: >60 mL/min (ref >60)
Glucose, Bld: 94 mg/dL (ref 65–99)
Potassium: 3.6 mmol/L (ref 3.5–5.1)
Sodium: 135 mmol/L (ref 135–145)

## 2016-05-16 LAB — CBC
HEMATOCRIT: 36.7 % — AB (ref 40.0–52.0)
HEMOGLOBIN: 12.6 g/dL — AB (ref 13.0–18.0)
MCH: 33.5 pg (ref 26.0–34.0)
MCHC: 34.3 g/dL (ref 32.0–36.0)
MCV: 97.5 fL (ref 80.0–100.0)
Platelets: 123 10*3/uL — ABNORMAL LOW (ref 150–440)
RBC: 3.77 MIL/uL — ABNORMAL LOW (ref 4.40–5.90)
RDW: 12.9 % (ref 11.5–14.5)
WBC: 5.1 10*3/uL (ref 3.8–10.6)

## 2016-05-16 LAB — GLUCOSE, CAPILLARY: Glucose-Capillary: 136 mg/dL — ABNORMAL HIGH (ref 65–99)

## 2016-05-16 NOTE — Progress Notes (Signed)
PT is recommending home health. RN case manager aware of above. Please reconsult if future social work needs arise. CSW signing off.   Korynne Dols, LCSW [phone]  

## 2016-05-16 NOTE — Progress Notes (Signed)
Physical Therapy Treatment Patient Details Name: Roy Trujillo MRN: [id number] DOB: [birth year] Today's Date: 05/16/2016    History of Present Illness Pt admitted for R THR. Pt with history of L THR.    PT Comments    Pt is making good progress towards goals with improved ambulation distance in hallway. Pt able to recite 3/3 hip precautions. Reviewed written HEP with all questions answered. Improved functional independence noted this session. Needs to perform stair training prior to discharge.   Follow Up Recommendations  Home health PT     Equipment Recommendations  Rolling walker with 5" wheels;Wheelchair (measurements PT)    Recommendations for Other Services       Precautions / Restrictions Precautions Precautions: Fall;Posterior Hip Precaution Booklet Issued: Yes (comment) Restrictions Weight Bearing Restrictions: Yes RLE Weight Bearing: Weight bearing as tolerated    Mobility  Bed Mobility Overal bed mobility: Needs Assistance Bed Mobility: Supine to Sit     Supine to sit: Min guard     General bed mobility comments: Improved technique this session with decreased cues for sequencing  Transfers Overall transfer level: Needs assistance Equipment used: Rolling walker (2 wheeled) Transfers: Sit to/from Stand Sit to Stand: Min guard         General transfer comment: Safe technique for standing up with RW. Upright posture noted  Ambulation/Gait Ambulation/Gait assistance: Supervision Ambulation Distance (Feet): 200 Feet Assistive device: Rolling walker (2 wheeled) Gait Pattern/deviations: Step-through pattern     General Gait Details: slow gait pattern, however safe technique. 1 standing rest break noted for fatigue. Upright posture noted. Pt demonstrated reciprocal gait pattern.   Stairs            Wheelchair Mobility    Modified Rankin (Stroke Patients Only)       Balance                                     Cognition Arousal/Alertness: Awake/alert Behavior During Therapy: WFL for tasks assessed/performed Overall Cognitive Status: Within Functional Limits for tasks assessed                      Exercises Other Exercises Other Exercises: supine ther-ex performed including R LE ankle pumps, quad sets, glut sets, hip abd/ad, and SAQ. All ther-ex performed x 12 reps with cga for performance    General Comments        Pertinent Vitals/Pain Pain Assessment: 0-10 Pain Score: 8  Pain Location: R hip Pain Descriptors / Indicators: Operative site guarding;Grimacing Pain Intervention(s): Limited activity within patient's tolerance;Premedicated before session (decreased down to 7/10 with ambulation)    Home Living                      Prior Function            PT Goals (current goals can now be found in the care plan section) Acute Rehab PT Goals Patient Stated Goal: to get stronger PT Goal Formulation: With patient Time For Goal Achievement: 05/29/16 Potential to Achieve Goals: Good Progress towards PT goals: Progressing toward goals    Frequency    BID      PT Plan Current plan remains appropriate    Co-evaluation             End of Session Equipment Utilized During Treatment: Gait belt Activity Tolerance: Patient tolerated treatment well Patient left: in bed;with  bed alarm set Nurse Communication: Mobility status PT Visit Diagnosis: Unsteadiness on feet (R26.81);Pain;Muscle weakness (generalized) (M62.81) Pain - Right/Left: Right Pain - part of body: Hip     Time: 9563-8756 PT Time Calculation (min) (ACUTE ONLY): 23 min  Charges:  $Gait Training: 8-22 mins $Therapeutic Exercise: 8-22 mins                    G Codes:       Roy Trujillo 05-Jun-2016, 4:28 PM  Roy Trujillo, PT, DPT [phone]

## 2016-05-16 NOTE — Evaluation (Signed)
Occupational Therapy Evaluation Patient Details Name: Roy Trujillo MRN: [id number] DOB: 24-Sep-1965 Today's Date: 05/16/2016    History of Present Illness Pt admitted for R THR. Pt with history of L THR.   Clinical Impression   Pt seen for OT evaluation this date. Pt has recently experienced decreased mobility and independence due to pain prior to surgery but before that was independent with all ADL/IADL, driving. Pt is eager to return to PLOF with less pain after the R THR. Pt presents with pain, decreased strength/ROM/activity tolerance/knowledge of AE/DME for self care tasks and increased need for assist with self care due to impairments. Pt would benefit from skilled OT services to provide education/training in use of AE for ADL, energy conservation strategies, and home/routines modifications to support falls prevention in order to maximize return to PLOF. Pt is a good candidate for HHOT upon discharge.    Follow Up Recommendations  Home health OT    Equipment Recommendations  3 in 1 bedside commode    Recommendations for Other Services       Precautions / Restrictions Precautions Precautions: Fall;Posterior Hip Precaution Booklet Issued: No Restrictions Weight Bearing Restrictions: Yes RLE Weight Bearing: Weight bearing as tolerated      Mobility Bed Mobility Overal bed mobility: Needs Assistance Bed Mobility: Supine to Sit     Supine to sit: Min assist     General bed mobility comments: min assist from spouse for sliding B LEs off bed, maintaining precautions  Transfers Overall transfer level: Needs assistance Equipment used: Rolling walker (2 wheeled) Transfers: Sit to/from Stand Sit to Stand: Min guard              Balance Overall balance assessment: Needs assistance Sitting-balance support: Feet supported Sitting balance-Leahy Scale: Good     Standing balance support: Bilateral upper extremity supported;During functional activity Standing  balance-Leahy Scale: Good                              ADL Overall ADL's : Needs assistance/impaired Eating/Feeding: Set up;Sitting   Grooming: Set up;Sitting   Upper Body Bathing: Sitting;Set up   Lower Body Bathing: Minimal assistance;Sitting/lateral leans;Sit to/from stand;With caregiver independent assisting   Upper Body Dressing : Sitting;Set up   Lower Body Dressing: Sit to/from stand;Sitting/lateral leans;Minimal assistance   Toilet Transfer: RW;Ambulation;Min Catering manager;Adhering to hip precautions Toilet Transfer Details (indicate cue type and reason): cues for review of hand placement to maximize safety         Functional mobility during ADLs: Min guard;Rolling walker General ADL Comments: Pt generally min A for LB ADL, occasional verbal cues for precautions     Vision Baseline Vision/History: Wears glasses Wears Glasses: Reading only Patient Visual Report: No change from baseline Vision Assessment?: No apparent visual deficits     Perception     Praxis Praxis Praxis tested?: Within functional limits    Pertinent Vitals/Pain Pain Score: 7  Pain Location: R hip Pain Descriptors / Indicators: Operative site guarding;Grimacing Pain Intervention(s): Limited activity within patient's tolerance;Monitored during session;Premedicated before session     Hand Dominance     Extremity/Trunk Assessment Upper Extremity Assessment Upper Extremity Assessment: Overall WFL for tasks assessed   Lower Extremity Assessment Lower Extremity Assessment: Defer to PT evaluation;RLE deficits/detail   Cervical / Trunk Assessment Cervical / Trunk Assessment: Normal   Communication Communication Communication: No difficulties   Cognition Arousal/Alertness: Awake/alert Behavior During Therapy: WFL for tasks assessed/performed Overall Cognitive  Status: Within Functional Limits for tasks assessed                     General Comments        Exercises       Shoulder Instructions      Home Living Family/patient expects to be discharged to:: Private residence Living Arrangements: Spouse/significant other Available Help at Discharge: Family;Available 24 hours/day Type of Home: House Home Access: Stairs to enter [employer] of Steps: 3 Entrance Stairs-Rails: Right Home Layout: One level     Bathroom Shower/Tub: Tub/shower unit Shower/tub characteristics: Engineer, building services: Standard Bathroom Accessibility: Yes How Accessible: Accessible via walker Home Equipment: Cane - single point;Hand held shower head          Prior Functioning/Environment Level of Independence: Independent        Comments: indep with ADL/IADL, driving; however more recently very limited by pain with pt staying in bed for extended periods of time, able to prepare simple meals, using electric cart when shopping; enjoys cooking, playing video games, being outside        OT Problem List: Decreased strength;Pain;Decreased range of motion;Decreased activity tolerance;Decreased knowledge of use of DME or AE      OT Treatment/Interventions: Self-care/ADL training;Energy conservation;Patient/family education;DME and/or AE instruction;Therapeutic exercise    OT Goals(Current goals can be found in the care plan section) Acute Rehab OT Goals Patient Stated Goal: to get stronger OT Goal Formulation: With patient Time For Goal Achievement: 05/23/16 Potential to Achieve Goals: Good  OT Frequency: Min 1X/week   Barriers to D/C:            Co-evaluation              End of Session Equipment Utilized During Treatment: Gait belt;Rolling walker  Activity Tolerance: Patient tolerated treatment well Patient left: in bed;with call bell/phone within reach;with family/visitor present;Other (comment) (seated EOB with PT in room for session )  OT Visit Diagnosis: Other abnormalities of gait and mobility (R26.89);Pain;Muscle  weakness (generalized) (M62.81) Pain - Right/Left: Right Pain - part of body: Hip                ADL either performed or assessed with clinical judgement  Time: 0832-0855 OT Time Calculation (min): 23 min Charges:  OT General Charges $OT Visit: 1 Procedure OT Evaluation $OT Eval Low Complexity: 1 Procedure OT Treatments $Self Care/Home Management : 8-22 mins G-Codes:     Richrd Prime, MPH, MS, OTR/L ascom [phone] 05/16/16, 10:28 AM

## 2016-05-16 NOTE — Progress Notes (Signed)
Physical Therapy Treatment Patient Details Name: Roy Trujillo MRN: [id number] DOB: [birth year] Today's Date: 05/16/2016    History of Present Illness Pt admitted for R THR. Pt with history of L THR.    PT Comments    Pt is making good progress towards goals with increased ambulation distance this session. Pt still limited by pain, however remains motivated to perform therapy. Written HEP reviewed and performed. Antalgic gait pattern noted with increased distance. Will continue to progress.  Follow Up Recommendations  Home health PT     Equipment Recommendations  Rolling walker with 5" wheels;Wheelchair (measurements PT)    Recommendations for Other Services       Precautions / Restrictions Precautions Precautions: Fall;Posterior Hip Precaution Booklet Issued: Yes (comment) Restrictions Weight Bearing Restrictions: Yes RLE Weight Bearing: Weight bearing as tolerated    Mobility  Bed Mobility Overal bed mobility: Needs Assistance Bed Mobility: Supine to Sit     Supine to sit: Min assist     General bed mobility comments: Pt receiving seated at EOB upon arrival  Transfers Overall transfer level: Needs assistance Equipment used: Rolling walker (2 wheeled) Transfers: Sit to/from Stand Sit to Stand: Min guard         General transfer comment: Pt able to push from seated surface and stand with equal WBing on B LEs. RW used for transfer  Ambulation/Gait Ambulation/Gait assistance: Min guard Ambulation Distance (Feet): 120 Feet Assistive device: Rolling walker (2 wheeled) Gait Pattern/deviations: Step-to pattern     General Gait Details: Pt ambulated in hallway starting with step to gait pattern, however able to progress to reciprocal gait pattern. Pt with cues for heel strike and knee extension during stance phase. Slow gait speed noted with increased pain with exertion. Pt fatigues with increased distance.   Stairs            Wheelchair  Mobility    Modified Rankin (Stroke Patients Only)       Balance Overall balance assessment: Needs assistance Sitting-balance support: Feet supported Sitting balance-Leahy Scale: Good     Standing balance support: Bilateral upper extremity supported;During functional activity Standing balance-Leahy Scale: Good                      Cognition Arousal/Alertness: Awake/alert Behavior During Therapy: WFL for tasks assessed/performed Overall Cognitive Status: Within Functional Limits for tasks assessed                      Exercises Other Exercises Other Exercises: seated ther-ex performed on R LE including ankle pumps, quad sets, glut sets, SAQ, LAQ, and hip abd/add. All ther-ex performed x 12 reps with min assist for performance.    General Comments        Pertinent Vitals/Pain Pain Assessment: 0-10 Pain Score: 8  Pain Location: R hip Pain Descriptors / Indicators: Operative site guarding;Grimacing Pain Intervention(s): Limited activity within patient's tolerance;Premedicated before session;Ice applied    Home Living Family/patient expects to be discharged to:: Private residence Living Arrangements: Spouse/significant other Available Help at Discharge: Family;Available 24 hours/day Type of Home: House Home Access: Stairs to enter Entrance Stairs-Rails: Right Home Layout: One level Home Equipment: Cane - single point;Hand held shower head      Prior Function Level of Independence: Independent      Comments: indep with ADL/IADL, driving; however more recently very limited by pain with pt staying in bed for extended periods of time, able to prepare simple meals, using electric cart  when shopping; enjoys cooking, playing video games, being outside   PT Goals (current goals can now be found in the care plan section) Acute Rehab PT Goals Patient Stated Goal: to get stronger PT Goal Formulation: With patient Time For Goal Achievement: 05/29/16 Potential  to Achieve Goals: Good Progress towards PT goals: Progressing toward goals    Frequency    BID      PT Plan Current plan remains appropriate    Co-evaluation             End of Session Equipment Utilized During Treatment: Gait belt Activity Tolerance: Patient limited by pain Patient left: in chair;with chair alarm set;with family/visitor present;with SCD's reapplied Nurse Communication: Mobility status PT Visit Diagnosis: Unsteadiness on feet (R26.81);Pain;Muscle weakness (generalized) (M62.81) Pain - Right/Left: Right Pain - part of body: Hip     Time: 0852-0920 PT Time Calculation (min) (ACUTE ONLY): 28 min  Charges:  $Gait Training: 8-22 mins $Therapeutic Exercise: 8-22 mins                    G Codes:       Debraann Livingstone 06/15/2016, 2:07 PM  Elizabeth Palau, PT, DPT [phone]

## 2016-05-16 NOTE — Discharge Instructions (Signed)

## 2016-05-16 NOTE — Anesthesia Postprocedure Evaluation (Signed)
Anesthesia Post Note  Patient: Roy Trujillo  Procedure(s) Performed: Procedure(s) (LRB): TOTAL HIP ARTHROPLASTY (Right)  Patient location during evaluation: Nursing Unit Anesthesia Type: Spinal Level of consciousness: awake, awake and alert and oriented Pain management: pain level controlled Vital Signs Assessment: post-procedure vital signs reviewed and stable Respiratory status: spontaneous breathing, nonlabored ventilation and respiratory function stable Cardiovascular status: blood pressure returned to baseline Postop Assessment: no headache and no backache     Last Vitals:  Vitals:   05/15/16 2337 05/16/16 0402  BP: 123/80 121/80  Pulse: 74 78  Resp: 18 18  Temp: 37.2 C 36.9 C    Last Pain:  Vitals:   05/16/16 0402  TempSrc: Oral  PainSc:                  Ginger Carne

## 2016-05-16 NOTE — Progress Notes (Addendum)
Occupational Therapy Treatment Patient Details Name: Roy Trujillo MRN: [id number] DOB: January 04, 1966 Today's Date: 05/16/2016    History of present illness Pt admitted for R THR. Pt with history of L THR.   OT comments  Pt seen for OT treatment session focused on education/training in AE for LB ADL tasks and ECS to support safety and functional independence in the home. Pt provided with handout of ECS to support recall/adherence. Pt verbalized understanding of all education provided, thankful for information. Pt has met all OT goals, will discharge from acute OT services.    Follow Up Recommendations  Home health OT    Equipment Recommendations  3 in 1 bedside commode    Recommendations for Other Services      Precautions / Restrictions Precautions Precautions: Fall;Posterior Hip Precaution Booklet Issued: No Restrictions Weight Bearing Restrictions: Yes RLE Weight Bearing: Weight bearing as tolerated       Mobility Bed Mobility                 Transfers    Balance                                   ADL                                         General ADL Comments: Pt educated in use of AE for LB dressing tasks. Pt with prior experience, thankful for review and verbalizes confidence now with using at home      Toombs tested?: Within functional limits    Cognition   Behavior During Therapy: [facility] for tasks assessed/performed Overall Cognitive Status: Within Functional Limits for tasks assessed                         Exercises Other Exercises Other Exercises: Pt educated in use of ECS and home/routines modifications to maximize functional independence and minimize falls risk. Pt verbalizes understanding and able to implement at home   Shoulder Instructions       General Comments      Pertinent Vitals/ Pain       Pain Assessment:  0-10 Pain Score: 8  Pain Location: R hip Pain Descriptors / Indicators: Operative site guarding;Grimacing Pain Intervention(s): Limited activity within patient's tolerance;Premedicated before session;Ice applied  Home Living                                          Prior Functioning/Environment              Frequency           Progress Toward Goals  OT Goals(current goals can now be found in the care plan section)  Progress towards OT goals: Goals met/education completed, patient discharged from OT  Acute Rehab OT Goals Patient Stated Goal: to get stronger OT Goal Formulation: With patient Time For Goal Achievement: 05/23/16 Potential to Achieve Goals: Good  Plan Discharge plan remains appropriate;All goals met and education completed, patient discharged from OT services    Co-evaluation  End of Session    OT Visit Diagnosis: Other abnormalities of gait and mobility (R26.89);Pain;Muscle weakness (generalized) (M62.81) Pain - Right/Left: Right Pain - part of body: Hip   Activity Tolerance Patient tolerated treatment well   Patient Left in bed;with call bell/phone within reach;with bed alarm set;with SCD's reapplied   Nurse Communication          Time: 1458-1510 OT Time Calculation (min): 12 min  Charges: OT General Charges $OT Visit: 1 Procedure OT Treatments $Self Care/Home Management : 8-22 mins  Jeni Salles, MPH, MS, OTR/L ascom [phone] 05/16/16, 3:34 PM

## 2016-05-16 NOTE — Progress Notes (Signed)
   Subjective: 1 Day Post-Op Procedure(s) (LRB): TOTAL HIP ARTHROPLASTY (Right) Patient reports pain as moderate.   Patient is well, and has had no acute complaints or problems We will start therapy today.  Plan is to go Home after hospital stay. no nausea and no vomiting Patient denies any chest pains or shortness of breath. Objective: Vital signs in last 24 hours: Temp:  [96.6 F (35.9 C)-98.9 F (37.2 C)] 98.5 F (36.9 C) (03/15 0402) Pulse Rate:  [57-78] 78 (03/15 0402) Resp:  [11-24] 18 (03/15 0402) BP: (78-138)/(55-80) 121/80 (03/15 0402) SpO2:  [98 %-100 %] 98 % (03/15 0402) Weight:  [56.7 kg (125 lb)] 56.7 kg (125 lb) (03/14 1325) well approximated incision Heels are non tender and elevated off the bed using rolled towels Intake/Output from previous day: 03/14 0701 - 03/15 0700 In: 3260 [P.O.:760; I.V.:2400; IV Piggyback:100] Out: 3180 [Urine:2650; Drains:430; Blood:100] Intake/Output this shift: No intake/output data recorded.   Recent Labs  05/16/16 0635  HGB 12.6*    Recent Labs  05/16/16 0635  WBC 5.1  RBC 3.77*  HCT 36.7*  PLT 123*    Recent Labs  05/16/16 0635  NA 135  K 3.6  CL 102  CO2 28  BUN 6  CREATININE 0.73  GLUCOSE 94  CALCIUM 8.4*   No results for input(s): LABPT, INR in the last 72 hours.  EXAM General - Patient is Alert, Appropriate and Oriented Extremity - Neurologically intact Neurovascular intact Sensation intact distally Intact pulses distally Dorsiflexion/Plantar flexion intact No cellulitis present Compartment soft Dressing - dressing C/D/I Motor Function - intact, moving foot and toes well on exam.    Past Medical History:  Diagnosis Date  . Avascular necrosis (HCC)   . Depression   . GERD (gastroesophageal reflux disease)   . HIV (human immunodeficiency virus infection) (HCC)     Assessment/Plan: 1 Day Post-Op Procedure(s) (LRB): TOTAL HIP ARTHROPLASTY (Right) Active Problems:   S/P total hip  arthroplasty  Estimated body mass index is 20.18 kg/m as calculated from the following:   Height as of this encounter: 5\' 6"  (1.676 m).   Weight as of this encounter: 56.7 kg (125 lb). Up with therapy D/C IV fluids Plan for discharge tomorrow Discharge home with home health  Labs: Were reviewed DVT Prophylaxis - Lovenox, Foot Pumps and TED hose Weight-Bearing as tolerated to right leg D/C O2 and Pulse OX and try on Room Air Begin working on bowel movement. Labs tomorrow morning  . [facility] 05/16/2016, 7:59 AM

## 2016-05-16 NOTE — Discharge Summary (Signed)
Physician Discharge Summary  Patient ID: Roy Trujillo MRN: [id number] DOB/AGE: [birth year] 50 y.o.  Admit date: 05/15/2016 Discharge date: 05/17/2016  Admission Diagnoses:  primary osteoarthritis of right hip   Discharge Diagnoses: Patient Active Problem List   Diagnosis Date Noted  . Alcohol abuse 05/15/2016  . Depression 05/15/2016  . 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 Avascular necrosis of bone of hip, right (HCC) 03/31/2016  . HIV infection (HCC) 03/04/1996    Past Medical History:  Diagnosis Date  . Avascular necrosis (HCC)   . Depression   . GERD (gastroesophageal reflux disease)   . HIV (human immunodeficiency virus infection) (HCC)      Transfusion: No transfusions during this admission   Consultants (if any):  none  Discharged Condition: Improved  Hospital Course: Roy Trujillo is an 51 y.o. male who was admitted 05/15/2016 with a diagnosis of degenerative arthrosis right hip and went to the operating room on 05/15/2016 and underwent the above named procedures.    Surgeries:Procedure(s): TOTAL HIP ARTHROPLASTY on 05/15/2016  PRE-OPERATIVE DIAGNOSIS: Degenerative arthrosis of the right hip, primary  POST-OPERATIVE DIAGNOSIS:  Same  PROCEDURE:  Right total hip arthroplasty  SURGEON:  Jena Gauss. M.D.  ASSISTANT:  Van Clines, PA (present and scrubbed throughout the case, critical for assistance with exposure, retraction, instrumentation, and closure)  ANESTHESIA: spinal  ESTIMATED BLOOD LOSS: 100 mL  FLUIDS REPLACED: 1950 mL of crystalloid  DRAINS: 2 medium drains to a Hemovac reservoir  IMPLANTS UTILIZED: DePuy 12 mm large stature AML femoral stem, 54 mm OD Pinnacle 100 acetabular component, neutral Pinnacle Marathon polyethylene insert, and a 36 mm M-SPEC +5 mm hip ball  INDICATIONS FOR SURGERY: Roy Trujillo is a 51 y.o. year old male with a long history of progressive hip and groin  pain. X-rays  demonstrated severe degenerative changes. The patient had not seen any significant improvement despite conservative nonsurgical intervention. After discussion of the risks and benefits of surgical intervention, the patient expressed understanding of the risks benefits and agree with plans for total hip arthroplasty.   The risks, benefits, and alternatives were discussed at length including but not limited to the risks of infection, bleeding, nerve injury, stiffness, blood clots, the need for revision surgery, limb length inequality, dislocation, cardiopulmonary complications, among others, and they were willing to proceed. Patient tolerated the surgery well. No complications .Patient was taken to PACU where she was stabilized and then transferred to the orthopedic floor.  Patient started on Lovenox 30 mg q 12  hrs. Foot pumps applied bilaterally at 80 mm hgb. Heels elevated off bed with rolled towels. No evidence of DVT. Calves non tender. Negative Homan. Physical therapy started on day #1 for gait training and transfer with OT starting on  day #1 for ADL and assisted devices. Patient has done well with therapy. Ambulated greater than 200 feet upon being discharged. Was able to ascend and descend 4 steps safely and independently  Patient's IV and Foley were discontinued on day #1 with Hemovac being discontinued on day #2. Dressing was changed on day #2 prior to patient being discharged   He was given perioperative antibiotics:  Anti-infectives    Start     Dose/Rate Route Frequency Ordered Stop   05/20/16 1000  azithromycin (ZITHROMAX) tablet 600 mg     600 mg Oral Weekly 05/15/16 1323     05/15/16 2000  emtricitabine-tenofovir AF (DESCOVY) 200-25 MG per tablet 1 tablet     1 tablet Oral  Daily 05/15/16 1323     05/15/16 1430  clindamycin (CLEOCIN) IVPB 600 mg     600 mg 100 mL/hr over 30 Minutes Intravenous Every 6 hours 05/15/16 1323 05/16/16 1429   05/15/16 1400  dolutegravir (TIVICAY) tablet  50 mg     50 mg Oral 2 times daily 05/15/16 1323     05/15/16 0602  clindamycin (CLEOCIN) 900 MG/50ML IVPB    Comments:  Buck Mam: cabinet override      05/15/16 0602 05/15/16 0824   05/15/16 0600  clindamycin (CLEOCIN) IVPB 900 mg     900 mg 100 mL/hr over 30 Minutes Intravenous On call to O.R. 05/14/16 2218 05/15/16 3833    .  He was Fitted with AV 1 compression foot pump devices, instructed on heel pumps, early ambulation, and TED stockings bilaterally for DVT prophylaxis.  He benefited maximally from the hospital stay and there were no complications.    Recent vital signs:  Vitals:   05/15/16 2337 05/16/16 0402  BP: 123/80 121/80  Pulse: 74 78  Resp: 18 18  Temp: 98.9 F (37.2 C) 98.5 F (36.9 C)    Recent laboratory studies:  Lab Results  Component Value Date   HGB 12.6 (L) 05/16/2016   HGB 14.8 05/01/2016   HGB 15.3 04/19/2015   Lab Results  Component Value Date   WBC 5.1 05/16/2016   PLT 123 (L) 05/16/2016   Lab Results  Component Value Date   INR 0.95 05/01/2016   Lab Results  Component Value Date   NA 135 05/16/2016   K 3.6 05/16/2016   CL 102 05/16/2016   CO2 28 05/16/2016   BUN 6 05/16/2016   CREATININE 0.73 05/16/2016   GLUCOSE 94 05/16/2016    Discharge Medications:   Allergies as of 05/17/2016      Reactions   Penicillins Other (See Comments)   Unaware of allergy since it was when he was a child   Bactrim [sulfamethoxazole-trimethoprim] Rash   Zerit [stavudine] Rash      Medication List    STOP taking these medications   naproxen 500 MG EC tablet Commonly known as:  EC NAPROSYN     TAKE these medications   azithromycin 600 MG tablet Commonly known as:  ZITHROMAX Take 600 mg by mouth once a week.   doxycycline 50 MG capsule Commonly known as:  VIBRAMYCIN Take 2 capsules (100 mg total) by mouth 2 (two) times daily.   emtricitabine-tenofovir 200-300 MG tablet Commonly known as:  TRUVADA Take 1 tablet by mouth daily.    enoxaparin 40 MG/0.4ML injection Commonly known as:  LOVENOX Inject 0.4 mLs (40 mg total) into the skin daily.   HYDROcodone-acetaminophen 5-325 MG tablet Commonly known as:  NORCO/VICODIN Take 1 tablet by mouth every 6 (six) hours as needed for moderate pain.   oxyCODONE 5 MG immediate release tablet Commonly known as:  Oxy IR/ROXICODONE Take 1-2 tablets (5-10 mg total) by mouth every 4 (four) hours as needed for severe pain.   promethazine 25 MG tablet Commonly known as:  PHENERGAN Take 25 mg by mouth every 6 (six) hours as needed for nausea or vomiting.   promethazine 25 MG tablet Commonly known as:  PHENERGAN Take 1 tablet (25 mg total) by mouth every 4 (four) hours as needed for nausea or vomiting.   TIVICAY 50 MG tablet Generic drug:  dolutegravir Take 1 tablet by mouth 2 (two) times daily.   traMADol 50 MG tablet Commonly known as:  ULTRAM Take 1-2 tablets (50-100 mg total) by mouth every 4 (four) hours as needed for moderate pain.            Durable Medical Equipment        Start     Ordered   05/15/16 1324  DME Walker rolling  Once    Question:  Patient needs a walker to treat with the following condition  Answer:  S/P total hip arthroplasty   05/15/16 1323   05/15/16 1324  DME Bedside commode  Once    Question:  Patient needs a bedside commode to treat with the following condition  Answer:  S/P total hip arthroplasty   05/15/16 1323      Diagnostic Studies: Dg Hip Port Unilat With Pelvis 1v Right  Result Date: 05/15/2016 CLINICAL DATA:  Status post right total hip joint prosthesis placement. EXAM: DG HIP (WITH OR WITHOUT PELVIS) 1V PORT RIGHT COMPARISON:  None in PACs FINDINGS: The patient has undergone right total hip joint prosthesis placement. Radiographic positioning of the prosthetic components is good. The interface with the native bone appears normal. There are surgical skin staples and drain line present. There is a pre-existing left hip joint  prosthesis. IMPRESSION: No immediate postprocedure complication following right total hip arthroplasty. Electronically Signed   By: David  [place] M.D.   On: 05/15/2016 13:46    Disposition: 01-Home or Self Care    Follow-up Information    Donato Heinz, MD On 06/27/2016.   Specialty:  Orthopedic Surgery Why:  at 10:00am Contact information: [address] [facility] [place] [address] [phone]            Signed: Tera Partridge. 05/16/2016, 8:03 AM

## 2016-05-17 LAB — BASIC METABOLIC PANEL
Anion gap: 7 (ref 5–15)
BUN: 5 mg/dL — AB (ref 6–20)
CO2: 27 mmol/L (ref 22–32)
CREATININE: 0.62 mg/dL (ref 0.61–1.24)
Calcium: 8.8 mg/dL — ABNORMAL LOW (ref 8.9–10.3)
Chloride: 99 mmol/L — ABNORMAL LOW (ref 101–111)
Glucose, Bld: 104 mg/dL — ABNORMAL HIGH (ref 65–99)
Potassium: 3.9 mmol/L (ref 3.5–5.1)
SODIUM: 133 mmol/L — AB (ref 135–145)

## 2016-05-17 LAB — CBC
HCT: 38.1 % — ABNORMAL LOW (ref 40.0–52.0)
Hemoglobin: 13.3 g/dL (ref 13.0–18.0)
MCH: 33.6 pg (ref 26.0–34.0)
MCHC: 34.9 g/dL (ref 32.0–36.0)
MCV: 96.2 fL (ref 80.0–100.0)
PLATELETS: 107 10*3/uL — AB (ref 150–440)
RBC: 3.96 MIL/uL — ABNORMAL LOW (ref 4.40–5.90)
RDW: 13.4 % (ref 11.5–14.5)
WBC: 8 10*3/uL (ref 3.8–10.6)

## 2016-05-17 LAB — SURGICAL PATHOLOGY

## 2016-05-17 MED ORDER — ENOXAPARIN SODIUM 40 MG/0.4ML ~~LOC~~ SOLN: 40.0000 mg | SUBCUTANEOUS | 0 refills | Status: DC

## 2016-05-17 MED ORDER — TRAMADOL HCL 50 MG PO TABS: 50.0000 mg | ORAL_TABLET | ORAL | 0 refills | Status: DC | PRN

## 2016-05-17 MED ORDER — OXYCODONE HCL 5 MG PO TABS: 5.0000 mg | ORAL_TABLET | ORAL | 0 refills | Status: DC | PRN

## 2016-05-17 NOTE — Care Management Important Message (Signed)
Important Message  Patient Details  Name: Roy Trujillo MRN: [id number] Date of Birth: August 21, 1965   Medicare Important Message Given:  Yes    Marily Memos, RN 05/17/2016, 8:58 AM

## 2016-05-17 NOTE — Progress Notes (Signed)
   Subjective: 2 Days Post-Op Procedure(s) (LRB): TOTAL HIP ARTHROPLASTY (Right) Patient reports pain as 2 on 0-10 scale.   Patient is well, and has had no acute complaints or problems Continue with physical therapy today.  Plan is to go Home after hospital stay. no nausea and no vomiting Patient denies any chest pains or shortness of breath. Objective: Vital signs in last 24 hours: Temp:  [98.7 F (37.1 C)-99.3 F (37.4 C)] 98.9 F (37.2 C) (03/16 0344) Pulse Rate:  [78-102] 102 (03/16 0344) Resp:  [18-19] 19 (03/16 0344) BP: (136-153)/(82-88) 150/86 (03/16 0344) SpO2:  [98 %-100 %] 98 % (03/16 0344) well approximated incision Heels are non tender and elevated off the bed using rolled towels Intake/Output from previous day: 03/15 0701 - 03/16 0700 In: 920 [P.O.:920] Out: 1795 [Urine:1625; Drains:170] Intake/Output this shift: No intake/output data recorded.   Recent Labs  05/16/16 0635 05/17/16 0433  HGB 12.6* 13.3    Recent Labs  05/16/16 0635 05/17/16 0433  WBC 5.1 8.0  RBC 3.77* 3.96*  HCT 36.7* 38.1*  PLT 123* 107*    Recent Labs  05/16/16 0635 05/17/16 0433  NA 135 133*  K 3.6 3.9  CL 102 99*  CO2 28 27  BUN 6 5*  CREATININE 0.73 0.62  GLUCOSE 94 104*  CALCIUM 8.4* 8.8*   No results for input(s): LABPT, INR in the last 72 hours.  EXAM General - Patient is Alert, Appropriate and Oriented Extremity - Neurologically intact Neurovascular intact Sensation intact distally Intact pulses distally Dorsiflexion/Plantar flexion intact No cellulitis present Compartment soft Dressing - scant drainage Motor Function - intact, moving foot and toes well on exam.    Past Medical History:  Diagnosis Date  . Avascular necrosis (HCC)   . Depression   . GERD (gastroesophageal reflux disease)   . HIV (human immunodeficiency virus infection) (HCC)     Assessment/Plan: 2 Days Post-Op Procedure(s) (LRB): TOTAL HIP ARTHROPLASTY (Right) Active  Problems:   S/P total hip arthroplasty  Estimated body mass index is 20.18 kg/m as calculated from the following:   Height as of this encounter: 5\' 6"  (1.676 m).   Weight as of this encounter: 56.7 kg (125 lb). Up with therapy Discharge home with home health after patient does steps.  Labs: Were reviewed DVT Prophylaxis - Lovenox, Foot Pumps and TED hose Weight-Bearing as tolerated to right leg Hemovac was discontinued today Please change dressing prior to patient going home today. Please give the patient 2 extra honeycomb dressings to take home.  . [facility] PA [facility] 05/17/2016, 7:09 AM

## 2016-05-17 NOTE — Care Management Note (Signed)
Case Management Note  Patient Details  Name: Roy Trujillo MRN: [id number] Date of Birth: Feb 13, 1966  Subjective/Objective:   Met with patient and significant other at bedside to discuss discharge planning. PCP is Dr. Ola Spurr. Patient has no home health preference from list provided. Referral to [facility] for [facility]PT. He will need a rolling walker and a BSC. Ordered from [facility] Pharmacy: [facility] [phone]. Called Lovenox 40 mg # 14 no refills per Dr. Clydell Hakim orders. Price of Lovenox is $ 3.35 Patient agreeable to POC.                 Action/Plan: Case Closed  Expected Discharge Date:  05/17/16               Expected Discharge Plan:  [facility]  In-House Referral:     Discharge planning Services  CM Consult  Post Acute Care Choice:  Durable Medical Equipment, Home Health Choice offered to:  Patient  DME Arranged:  Bedside commode, Walker rolling DME Agency:  Start:    [place]:  [facility] (now [facility])  Status of Service:  Completed, signed off  If discussed at [employer] of Stay Meetings, dates discussed:    Additional Comments:  Jolly Mango, RN 05/17/2016, 9:31 AM

## 2016-05-17 NOTE — Progress Notes (Signed)
Patient ready for discharge home. Reviewed discharge instructions and prescriptions. Family at bedside.

## 2016-05-17 NOTE — Progress Notes (Signed)
Physical Therapy Treatment Patient Details Name: Roy Trujillo MRN: [id number] DOB: October 26, 1965 Today's Date: 05/17/2016    History of Present Illness Pt admitted for R THR. Pt with history of L THR.    PT Comments    Pt is making good progress towards goals and is ready for discharge this date. Pt has completed stair training with safe technique. Still demonstrates slow gait speed during ambulation. Pt able to recite 3/3 hip precautions. Good technique for there-ex with all questions addressed. Pt motivated to perform therapy and excited to dc home.    Follow Up Recommendations  Home health PT     Equipment Recommendations       Recommendations for Other Services       Precautions / Restrictions Precautions Precautions: Fall;Posterior Hip Precaution Booklet Issued: Yes (comment) Restrictions Weight Bearing Restrictions: Yes RLE Weight Bearing: Weight bearing as tolerated    Mobility  Bed Mobility Overal bed mobility: Needs Assistance Bed Mobility: Supine to Sit     Supine to sit: Min guard     General bed mobility comments: Improved technique this session with decreased cues for sequencing  Transfers Overall transfer level: Needs assistance Equipment used: Rolling walker (2 wheeled) Transfers: Sit to/from Stand Sit to Stand: Min guard         General transfer comment: Safe technique for standing up with RW. Upright posture noted  Ambulation/Gait Ambulation/Gait assistance: Supervision Ambulation Distance (Feet): 250 Feet Assistive device: Rolling walker (2 wheeled) Gait Pattern/deviations: Step-through pattern     General Gait Details: Still demonstrates slow gait speed, however reciprocal gait pattern performed with safe technique. No rest breaks required.   Stairs Stairs: Yes   Stair Management: One rail Right;Step to pattern Number of Stairs: 4 General stair comments: Pt able to ambulate up/down 4 steps with 1 rail and safe technique. Pt  able to maintain hip precautions without cues. Safe technique performed  Wheelchair Mobility    Modified Rankin (Stroke Patients Only)       Balance                                    Cognition Arousal/Alertness: Awake/alert Behavior During Therapy: WFL for tasks assessed/performed Overall Cognitive Status: Within Functional Limits for tasks assessed                      Exercises Other Exercises Other Exercises: supine ther-ex performed including R LE ankle pumps, glut sets, quad sets, hip abd/add, LAQ, and SAQ. All ther-ex performed x 15 reps with cga and cues for correct technique    General Comments        Pertinent Vitals/Pain Pain Assessment: 0-10 Pain Score: 6  Pain Location: R hip Pain Descriptors / Indicators: Operative site guarding;Grimacing Pain Intervention(s): Limited activity within patient's tolerance;Premedicated before session    Home Living                      Prior Function            PT Goals (current goals can now be found in the care plan section) Acute Rehab PT Goals Patient Stated Goal: to get stronger PT Goal Formulation: With patient Time For Goal Achievement: 05/29/16 Potential to Achieve Goals: Good Progress towards PT goals: Progressing toward goals    Frequency    BID      PT Plan Current plan remains appropriate  Co-evaluation             End of Session Equipment Utilized During Treatment: Gait belt Activity Tolerance: Patient tolerated treatment well Patient left: in bed;with family/visitor present Nurse Communication: Mobility status PT Visit Diagnosis: Unsteadiness on feet (R26.81);Pain;Muscle weakness (generalized) (M62.81) Pain - Right/Left: Right Pain - part of body: Hip     Time: 1505-6979 PT Time Calculation (min) (ACUTE ONLY): 25 min  Charges:  $Gait Training: 8-22 mins $Therapeutic Exercise: 8-22 mins                    G Codes:        Llesenia Fogal May 19, 2016, 11:09 AM  Elizabeth Palau, PT, DPT [phone]

## 2016-11-26 ENCOUNTER — Emergency Department
Admission: EM | Admit: 2016-11-26 | Discharge: 2016-11-26 | Disposition: A | Discharge status: Home / Self Care | Payer: Medicare Other | Attending: Emergency Medicine | Admitting: Emergency Medicine

## 2016-11-26 ENCOUNTER — Emergency Department: Payer: Medicare Other

## 2016-11-26 ENCOUNTER — Encounter: Payer: Self-pay | Admitting: Medical Oncology

## 2016-11-26 DIAGNOSIS — Z79899 Other long term (current) drug therapy: Secondary | ICD-10-CM | POA: Diagnosis not present

## 2016-11-26 DIAGNOSIS — R002 Palpitations: Secondary | ICD-10-CM | POA: Insufficient documentation

## 2016-11-26 LAB — COMPREHENSIVE METABOLIC PANEL
ALK PHOS: 71 U/L (ref 38–126)
ALT: 13 U/L — AB (ref 17–63)
AST: 25 U/L (ref 15–41)
Albumin: 5 g/dL (ref 3.5–5.0)
Anion gap: 10 (ref 5–15)
BUN: 8 mg/dL (ref 6–20)
CALCIUM: 9.1 mg/dL (ref 8.9–10.3)
CHLORIDE: 104 mmol/L (ref 101–111)
CO2: 25 mmol/L (ref 22–32)
Creatinine, Ser: 0.73 mg/dL (ref 0.61–1.24)
GFR calc Af Amer: >60 mL/min (ref >60)
Glucose, Bld: 110 mg/dL — ABNORMAL HIGH (ref 65–99)
Potassium: 3.3 mmol/L — ABNORMAL LOW (ref 3.5–5.1)
SODIUM: 139 mmol/L (ref 135–145)
TOTAL PROTEIN: 7.5 g/dL (ref 6.5–8.1)
Total Bilirubin: 1.4 mg/dL — ABNORMAL HIGH (ref 0.3–1.2)

## 2016-11-26 LAB — URINALYSIS, COMPLETE (UACMP) WITH MICROSCOPIC
BACTERIA UA: NONE SEEN
Bilirubin Urine: NEGATIVE
Glucose, UA: NEGATIVE mg/dL
Hgb urine dipstick: NEGATIVE
Ketones, ur: 20 mg/dL — AB
Leukocytes, UA: NEGATIVE
Nitrite: NEGATIVE
PH: 8 (ref 5.0–8.0)
Protein, ur: NEGATIVE mg/dL
RBC / HPF: NONE SEEN RBC/hpf (ref 0–5)
SPECIFIC GRAVITY, URINE: 1.004 — AB (ref 1.005–1.030)
SQUAMOUS EPITHELIAL / LPF: NONE SEEN

## 2016-11-26 LAB — CBC
HCT: 44.6 % (ref 40.0–52.0)
HEMOGLOBIN: 15.5 g/dL (ref 13.0–18.0)
MCH: 33.3 pg (ref 26.0–34.0)
MCHC: 34.8 g/dL (ref 32.0–36.0)
MCV: 95.8 fL (ref 80.0–100.0)
PLATELETS: 115 10*3/uL — AB (ref 150–440)
RBC: 4.65 MIL/uL (ref 4.40–5.90)
RDW: 13.3 % (ref 11.5–14.5)
WBC: 4.3 10*3/uL (ref 3.8–10.6)

## 2016-11-26 LAB — TROPONIN I: Troponin I: <0.03 ng/mL (ref <0.03)

## 2016-11-26 MED ORDER — SODIUM CHLORIDE 0.9 % IV SOLN
1000.0000 mL | Freq: Once | INTRAVENOUS | Status: AC
  Administered 2016-11-26: 1000 mL via INTRAVENOUS

## 2016-11-26 MED ORDER — ACETAMINOPHEN 325 MG PO TABS
650.0000 mg | ORAL_TABLET | Freq: Once | ORAL | Status: AC
  Administered 2016-11-26: 650 mg via ORAL

## 2016-11-26 MED ORDER — PROMETHAZINE HCL 25 MG PO TABS
12.5000 mg | ORAL_TABLET | Freq: Once | ORAL | Status: AC
  Administered 2016-11-26: 12.5 mg via ORAL
  Filled 2016-11-26: qty 1

## 2016-11-26 MED ORDER — ACETAMINOPHEN 325 MG PO TABS
ORAL_TABLET | ORAL | Status: AC
  Administered 2016-11-26: 650 mg via ORAL
  Filled 2016-11-26: qty 2

## 2016-11-26 NOTE — ED Notes (Signed)
Pt reports improvement in palpitations but states it "still feels like it is racing sometimes" pt also reports headache and nausea. MD made aware.

## 2016-11-26 NOTE — ED Provider Notes (Signed)
[facility] Emergency Department Provider Note   ____________________________________________    I have reviewed the triage vital signs and the nursing notes.   HISTORY  Chief Complaint Palpitations     HPI Roy Trujillo is a 51 y.o. male Who presents with complaints of palpitations. Patient reports this began yesterday morning and he woke up feeling like his heart was racing. This continued throughout the day he felt the same way this morning. He denies chest pain. No cough or shortness of breath. He does have a history of HIV, he is compliant with his medications. No calf pain or swelling, no recent travel, no pleurisy. Patient reports he has frequent nausea from HIV medications in the last time he presented with palpitations it was because of dehydration.  Medical records also demonstrate a history of alcohol abuse which she did not mention   Past Medical History:  Diagnosis Date  . Avascular necrosis (HCC)   . Depression   . GERD (gastroesophageal reflux disease)   . HIV (human immunodeficiency virus infection) [facility])     Patient Active Problem List   Diagnosis Date Noted  . Alcohol abuse 05/15/2016  . Depression 05/15/2016  . 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 Avascular necrosis of bone of hip, right (HCC) 03/31/2016  . HIV infection (HCC) 03/04/1996    Past Surgical History:  Procedure Laterality Date  . CHOLECYSTECTOMY  2013  . HERNIA REPAIR  1974   double inguinal  . JOINT REPLACEMENT Left    left hip  . TONSILLECTOMY     as a child  . TOTAL HIP ARTHROPLASTY Right 05/15/2016   Procedure: TOTAL HIP ARTHROPLASTY;  Surgeon: Donato Heinz, MD;  Location: [facility] ORS;  Service: Orthopedics;  Laterality: Right;    Prior to Admission medications   Medication Sig Start Date End Date Taking? Authorizing Provider  DESCOVY 200-25 MG tablet Take 1 tablet by mouth daily. 10/01/16  Yes [provider]  dolutegravir  (TIVICAY) 50 MG tablet Take 1 tablet by mouth 2 (two) times daily. 03/27/15  Yes [provider]  promethazine (PHENERGAN) 25 MG tablet Take 1 tablet (25 mg total) by mouth every 4 (four) hours as needed for nausea or vomiting. 04/19/15  Yes Emily Filbert, MD  traMADol (ULTRAM) 50 MG tablet Take 1-2 tablets (50-100 mg total) by mouth every 4 (four) hours as needed for moderate pain. Patient taking differently: Take 100 mg by mouth at bedtime.  05/17/16  Yes Tera Partridge, PA  doxycycline (VIBRAMYCIN) 50 MG capsule Take 2 capsules (100 mg total) by mouth 2 (two) times daily. Patient not taking: Reported on 04/19/2015 10/05/14   Darien Ramus, MD  enoxaparin (LOVENOX) 40 MG/0.4ML injection Inject 0.4 mLs (40 mg total) into the skin daily. Patient not taking: Reported on 11/26/2016 05/17/16   Tera Partridge, PA  oxyCODONE (OXY IR/ROXICODONE) 5 MG immediate release tablet Take 1-2 tablets (5-10 mg total) by mouth every 4 (four) hours as needed for severe pain. Patient not taking: Reported on 11/26/2016 05/17/16   Tera Partridge, PA     Allergies Penicillins; Bactrim [sulfamethoxazole-trimethoprim]; and Zerit [stavudine]  No family history on file.  Social History Social History  Substance Use Topics  . Smoking status: Never Smoker  . Smokeless tobacco: Never Used  . Alcohol use No     Comment: no alcohol for several months    Review of Systems  Constitutional: No fever/chills Eyes: No visual changes.  ENT: No sore throat. Cardiovascular: Denies chest pain. Respiratory: Denies shortness of breath. Gastrointestinal: No abdominal pain.  No nausea, no vomiting.   Genitourinary: Negative for dysuria. Musculoskeletal: Negative for back pain. Skin: Negative for rash. Neurological: Negative for headaches or weakness   ____________________________________________   PHYSICAL EXAM:  VITAL SIGNS: ED Triage Vitals  Enc Vitals Group     BP 11/26/16 0907 (!) 168/89     Pulse Rate  11/26/16 0907 (!) 108     Resp 11/26/16 0907 18     Temp 11/26/16 0907 98.7 F (37.1 C)     Temp Source 11/26/16 0907 Oral     SpO2 11/26/16 0907 97 %     Weight 11/26/16 0915 52.2 kg (115 lb)     Height 11/26/16 0915 1.676 m (5\' 6" )     Head Circumference --      Peak Flow --      Pain Score 11/26/16 0915 8     Pain Loc --      Pain Edu? --      Excl. in GC? --     Constitutional: Alert and oriented. No acute distress. Pleasant and interactive Eyes: Conjunctivae are normal.   Nose: No congestion/rhinnorhea. Mouth/Throat: Mucous membranes are moist.   Neck:  Painless ROM Cardiovascular: mild tachycardia, regular rhythm. Grossly normal heart sounds.  Good peripheral circulation. Respiratory: Normal respiratory effort.  No retractions. Lungs CTAB. Gastrointestinal: Soft and nontender. No distention.  No CVA tenderness.  Musculoskeletal: No lower extremity tenderness nor edema.  Warm and well perfused Neurologic:  Normal speech and language. No gross focal neurologic deficits are appreciated.  Skin:  Skin is warm, dry and intact. No rash noted. Psychiatric: Mood and affect are normal. Speech and behavior are normal.  ____________________________________________   LABS (all labs ordered are listed, but only abnormal results are displayed)  Labs Reviewed  CBC - Abnormal; Notable for the following:       Result Value   Platelets 115 (*)    All other components within normal limits  COMPREHENSIVE METABOLIC PANEL - Abnormal; Notable for the following:    Potassium 3.3 (*)    Glucose, Bld 110 (*)    ALT 13 (*)    Total Bilirubin 1.4 (*)    All other components within normal limits  URINALYSIS, COMPLETE (UACMP) WITH MICROSCOPIC - Abnormal; Notable for the following:    Color, Urine COLORLESS (*)    APPearance CLEAR (*)    Specific Gravity, Urine 1.004 (*)    Ketones, ur 20 (*)    All other components within normal limits  TROPONIN I    ____________________________________________  EKG  ED ECG REPORT I, 11/28/16, the attending physician, personally viewed and interpreted this ECG.  Date: 11/26/2016  Rate: 99 Rhythm: normal sinus rhythm QRS Axis: normal Intervals: normal ST/T Wave abnormalities: normal Narrative Interpretation: no evidence of acute ischemia  ____________________________________________  RADIOLOGY  Chest x-ray unremarkable, negative for pneumonia ____________________________________________   PROCEDURES  Procedure(s) performed: No    Critical Care performed: No ____________________________________________   INITIAL IMPRESSION / ASSESSMENT AND PLAN / ED COURSE  Pertinent labs & imaging results that were available during my care of the patient were reviewed by me and considered in my medical decision making (see chart for details).  Patient overall well-appearing and in no acute distress. Mild elevation of heart rate but no chest pain or pleurisy.we will check labs give IV fluids and reevaluate  Differential includes dehydration, electrolyte abnormality, infection  After IV fluids patient reported feeling significantly better. Workup was overall unremarkable.Recommended outpatient follow-up with PCP. Return cautioned as discussed    ____________________________________________   FINAL CLINICAL IMPRESSION(S) / ED DIAGNOSES  Final diagnoses:  Palpitations      NEW MEDICATIONS STARTED DURING THIS VISIT:  Discharge Medication List as of 11/26/2016 12:53 PM       Note:  This document was prepared using Dragon voice recognition software and may include unintentional dictation errors.    Jene Every, MD 11/26/16 1315

## 2016-11-26 NOTE — ED Triage Notes (Signed)
Pt reports he woke up yesterday morning feeling like his heart was racing. Pt reports this has continued. Pt denies chest pain, reports headache.

## 2016-11-26 NOTE — ED Notes (Signed)
Hooked patient back up to monitor. 

## 2017-02-09 ENCOUNTER — Emergency Department: Payer: Medicare Other

## 2017-02-09 ENCOUNTER — Inpatient Hospital Stay
Admission: EM | Admit: 2017-02-09 | Discharge: 2017-02-11 | DRG: 917 | Disposition: A | Discharge status: Home / Self Care | Payer: Medicare Other | Attending: Internal Medicine | Admitting: Internal Medicine

## 2017-02-09 ENCOUNTER — Other Ambulatory Visit: Payer: Self-pay

## 2017-02-09 DIAGNOSIS — K219 Gastro-esophageal reflux disease without esophagitis: Secondary | ICD-10-CM | POA: Diagnosis present

## 2017-02-09 DIAGNOSIS — F1021 Alcohol dependence, in remission: Secondary | ICD-10-CM | POA: Diagnosis present

## 2017-02-09 DIAGNOSIS — T375X2A Poisoning by antiviral drugs, intentional self-harm, initial encounter: Principal | ICD-10-CM | POA: Diagnosis present

## 2017-02-09 DIAGNOSIS — T450X1A Poisoning by antiallergic and antiemetic drugs, accidental (unintentional), initial encounter: Secondary | ICD-10-CM | POA: Diagnosis present

## 2017-02-09 DIAGNOSIS — Z79891 Long term (current) use of opiate analgesic: Secondary | ICD-10-CM

## 2017-02-09 DIAGNOSIS — Z9049 Acquired absence of other specified parts of digestive tract: Secondary | ICD-10-CM | POA: Diagnosis not present

## 2017-02-09 DIAGNOSIS — S01311A Laceration without foreign body of right ear, initial encounter: Secondary | ICD-10-CM | POA: Diagnosis present

## 2017-02-09 DIAGNOSIS — F1220 Cannabis dependence, uncomplicated: Secondary | ICD-10-CM | POA: Diagnosis present

## 2017-02-09 DIAGNOSIS — Z881 Allergy status to other antibiotic agents status: Secondary | ICD-10-CM | POA: Diagnosis not present

## 2017-02-09 DIAGNOSIS — Z96643 Presence of artificial hip joint, bilateral: Secondary | ICD-10-CM | POA: Diagnosis present

## 2017-02-09 DIAGNOSIS — Z880 Allergy status to penicillin: Secondary | ICD-10-CM | POA: Diagnosis not present

## 2017-02-09 DIAGNOSIS — B20 Human immunodeficiency virus [HIV] disease: Secondary | ICD-10-CM | POA: Diagnosis present

## 2017-02-09 DIAGNOSIS — G92 Toxic encephalopathy: Secondary | ICD-10-CM | POA: Diagnosis present

## 2017-02-09 DIAGNOSIS — Z79899 Other long term (current) drug therapy: Secondary | ICD-10-CM

## 2017-02-09 DIAGNOSIS — Z888 Allergy status to other drugs, medicaments and biological substances status: Secondary | ICD-10-CM | POA: Diagnosis not present

## 2017-02-09 DIAGNOSIS — M1990 Unspecified osteoarthritis, unspecified site: Secondary | ICD-10-CM | POA: Diagnosis present

## 2017-02-09 DIAGNOSIS — W19XXXA Unspecified fall, initial encounter: Secondary | ICD-10-CM | POA: Diagnosis present

## 2017-02-09 DIAGNOSIS — T43212A Poisoning by selective serotonin and norepinephrine reuptake inhibitors, intentional self-harm, initial encounter: Secondary | ICD-10-CM | POA: Diagnosis present

## 2017-02-09 DIAGNOSIS — Z803 Family history of malignant neoplasm of breast: Secondary | ICD-10-CM | POA: Diagnosis not present

## 2017-02-09 DIAGNOSIS — F332 Major depressive disorder, recurrent severe without psychotic features: Secondary | ICD-10-CM | POA: Diagnosis present

## 2017-02-09 DIAGNOSIS — R4182 Altered mental status, unspecified: Secondary | ICD-10-CM

## 2017-02-09 DIAGNOSIS — F4321 Adjustment disorder with depressed mood: Secondary | ICD-10-CM | POA: Diagnosis present

## 2017-02-09 DIAGNOSIS — T4271XA Poisoning by unspecified antiepileptic and sedative-hypnotic drugs, accidental (unintentional), initial encounter: Secondary | ICD-10-CM

## 2017-02-09 DIAGNOSIS — G9340 Encephalopathy, unspecified: Secondary | ICD-10-CM | POA: Diagnosis present

## 2017-02-09 LAB — URINE DRUG SCREEN, QUALITATIVE (ARMC ONLY)
AMPHETAMINES, UR SCREEN: NOT DETECTED
BARBITURATES, UR SCREEN: NOT DETECTED
BENZODIAZEPINE, UR SCRN: NOT DETECTED
Cannabinoid 50 Ng, Ur ~~LOC~~: POSITIVE — AB
Cocaine Metabolite,Ur ~~LOC~~: NOT DETECTED
MDMA (Ecstasy)Ur Screen: NOT DETECTED
METHADONE SCREEN, URINE: NOT DETECTED
Opiate, Ur Screen: NOT DETECTED
Phencyclidine (PCP) Ur S: NOT DETECTED
TRICYCLIC, UR SCREEN: NOT DETECTED

## 2017-02-09 LAB — CBC
HCT: 44.9 % (ref 40.0–52.0)
Hemoglobin: 15.2 g/dL (ref 13.0–18.0)
MCH: 32.7 pg (ref 26.0–34.0)
MCHC: 33.8 g/dL (ref 32.0–36.0)
MCV: 96.9 fL (ref 80.0–100.0)
PLATELETS: 116 10*3/uL — AB (ref 150–440)
RBC: 4.64 MIL/uL (ref 4.40–5.90)
RDW: 13.7 % (ref 11.5–14.5)
WBC: 5.6 10*3/uL (ref 3.8–10.6)

## 2017-02-09 LAB — ETHANOL

## 2017-02-09 LAB — COMPREHENSIVE METABOLIC PANEL
ALK PHOS: 74 U/L (ref 38–126)
ALT: 42 U/L (ref 17–63)
AST: 35 U/L (ref 15–41)
Albumin: 4.5 g/dL (ref 3.5–5.0)
Anion gap: 10 (ref 5–15)
BILIRUBIN TOTAL: 0.9 mg/dL (ref 0.3–1.2)
BUN: 8 mg/dL (ref 6–20)
CALCIUM: 8.9 mg/dL (ref 8.9–10.3)
CHLORIDE: 106 mmol/L (ref 101–111)
CO2: 27 mmol/L (ref 22–32)
CREATININE: 0.78 mg/dL (ref 0.61–1.24)
Glucose, Bld: 126 mg/dL — ABNORMAL HIGH (ref 65–99)
Potassium: 3.7 mmol/L (ref 3.5–5.1)
Sodium: 143 mmol/L (ref 135–145)
TOTAL PROTEIN: 7.2 g/dL (ref 6.5–8.1)

## 2017-02-09 LAB — LIPASE, BLOOD: LIPASE: 20 U/L (ref 11–51)

## 2017-02-09 LAB — ACETAMINOPHEN LEVEL

## 2017-02-09 LAB — TROPONIN I: Troponin I: <0.03 ng/mL (ref <0.03)

## 2017-02-09 LAB — AMMONIA: AMMONIA: 27 umol/L (ref 9–35)

## 2017-02-09 LAB — SALICYLATE LEVEL

## 2017-02-09 MED ORDER — LORAZEPAM 2 MG/ML IJ SOLN: 1.0000 mg | Freq: Four times a day (QID) | INTRAMUSCULAR | Status: DC | PRN

## 2017-02-09 MED ORDER — PANTOPRAZOLE SODIUM 40 MG IV SOLR
40.0000 mg | INTRAVENOUS | Status: DC
  Administered 2017-02-09 – 2017-02-10 (×2): 40 mg via INTRAVENOUS
  Filled 2017-02-09 (×2): qty 40

## 2017-02-09 MED ORDER — SODIUM CHLORIDE 0.9 % IV BOLUS (SEPSIS)
1000.0000 mL | Freq: Once | INTRAVENOUS | Status: AC
  Administered 2017-02-09: 1000 mL via INTRAVENOUS

## 2017-02-09 MED ORDER — DEXTROSE-NACL 5-0.45 % IV SOLN
INTRAVENOUS | Status: DC
  Administered 2017-02-09: via INTRAVENOUS

## 2017-02-09 MED ORDER — LORAZEPAM 1 MG PO TABS: 1.0000 mg | ORAL_TABLET | Freq: Four times a day (QID) | ORAL | Status: DC | PRN

## 2017-02-09 MED ORDER — FOLIC ACID 1 MG PO TABS
1.0000 mg | ORAL_TABLET | Freq: Every day | ORAL | Status: DC
  Administered 2017-02-10 – 2017-02-11 (×2): 1 mg via ORAL
  Filled 2017-02-09 (×2): qty 1

## 2017-02-09 MED ORDER — ADULT MULTIVITAMIN W/MINERALS CH
1.0000 | ORAL_TABLET | Freq: Every day | ORAL | Status: DC
  Administered 2017-02-10 – 2017-02-11 (×2): 1 via ORAL
  Filled 2017-02-09 (×2): qty 1

## 2017-02-09 MED ORDER — POLYETHYLENE GLYCOL 3350 17 G PO PACK: 17.0000 g | PACK | Freq: Every day | ORAL | Status: DC | PRN

## 2017-02-09 MED ORDER — VITAMIN B-1 100 MG PO TABS
100.0000 mg | ORAL_TABLET | Freq: Every day | ORAL | Status: DC
  Administered 2017-02-10 – 2017-02-11 (×2): 100 mg via ORAL
  Filled 2017-02-09 (×2): qty 1

## 2017-02-09 MED ORDER — ONDANSETRON HCL 4 MG PO TABS: 4.0000 mg | ORAL_TABLET | Freq: Four times a day (QID) | ORAL | Status: DC | PRN

## 2017-02-09 MED ORDER — ENOXAPARIN SODIUM 40 MG/0.4ML ~~LOC~~ SOLN
40.0000 mg | SUBCUTANEOUS | Status: DC
  Administered 2017-02-09 – 2017-02-10 (×2): 40 mg via SUBCUTANEOUS
  Filled 2017-02-09 (×2): qty 0.4

## 2017-02-09 MED ORDER — ACETAMINOPHEN 650 MG RE SUPP: 650.0000 mg | Freq: Four times a day (QID) | RECTAL | Status: DC | PRN

## 2017-02-09 MED ORDER — HYDROCODONE-ACETAMINOPHEN 5-325 MG PO TABS
1.0000 | ORAL_TABLET | ORAL | Status: DC | PRN
  Administered 2017-02-09 – 2017-02-11 (×5): 1 via ORAL
  Filled 2017-02-09 (×5): qty 1

## 2017-02-09 MED ORDER — ONDANSETRON HCL 4 MG/2ML IJ SOLN: 4.0000 mg | Freq: Four times a day (QID) | INTRAMUSCULAR | Status: DC | PRN

## 2017-02-09 MED ORDER — ACETAMINOPHEN 325 MG PO TABS
650.0000 mg | ORAL_TABLET | Freq: Four times a day (QID) | ORAL | Status: DC | PRN
  Administered 2017-02-11 (×2): 650 mg via ORAL
  Filled 2017-02-09 (×2): qty 2

## 2017-02-09 MED ORDER — THIAMINE HCL 100 MG/ML IJ SOLN
100.0000 mg | Freq: Every day | INTRAMUSCULAR | Status: DC
  Filled 2017-02-09 (×2): qty 1

## 2017-02-09 NOTE — ED Provider Notes (Signed)
[facility] Emergency Department Provider Note  ____________________________________________   First MD Initiated Contact with Patient 02/09/17 1856     (approximate)  I have reviewed the triage vital signs and the nursing notes.   HISTORY  Chief Complaint Altered Mental Status   HPI Roy Trujillo is a 51 y.o. male with history of HIV on antiretroviral therapy who is presenting to the emergency department today with 3 weeks of episodes of weakness as well as erratic behavior.  He is here with his husband who says that the patient has been getting depressed and has doubled his dose of his antiviral medication because he thinks he gets a "high" from this medication.  The patient's husband says that he has 3 episodes where he acts strangely and weak but then returns to his baseline.  However, the patient's husband says that today the patient has had multiple hours of acting erratically and is fallen and cut his right ear.  The patient's husband reports that the patient has had a tetanus shot within the past 5 years.  Patient unable to give further history at this time.  Only able to answer very minimal yes/no questions and says that he is having no pain at this time.  Unknown last viral load and CD4 count.  Past Medical History:  Diagnosis Date  . Avascular necrosis (HCC)   . Depression   . GERD (gastroesophageal reflux disease)   . HIV (human immunodeficiency virus infection) [facility])     Patient Active Problem List   Diagnosis Date Noted  . Alcohol abuse 05/15/2016  . Depression 05/15/2016  . 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 Avascular necrosis of bone of hip, right (HCC) 03/31/2016  . HIV infection (HCC) 03/04/1996    Past Surgical History:  Procedure Laterality Date  . CHOLECYSTECTOMY  2013  . HERNIA REPAIR  1974   double inguinal  . JOINT REPLACEMENT Left    left hip  . TONSILLECTOMY     as a child  . TOTAL HIP ARTHROPLASTY Right  05/15/2016   Procedure: TOTAL HIP ARTHROPLASTY;  Surgeon: Donato Heinz, MD;  Location: [facility] ORS;  Service: Orthopedics;  Laterality: Right;    Prior to Admission medications   Medication Sig Start Date End Date Taking? Authorizing Provider  DESCOVY 200-25 MG tablet Take 1 tablet by mouth daily. 10/01/16   [provider]  dolutegravir (TIVICAY) 50 MG tablet Take 1 tablet by mouth 2 (two) times daily. 03/27/15   [provider]  promethazine (PHENERGAN) 25 MG tablet Take 1 tablet (25 mg total) by mouth every 4 (four) hours as needed for nausea or vomiting. 04/19/15   Emily Filbert, MD  traMADol (ULTRAM) 50 MG tablet Take 1-2 tablets (50-100 mg total) by mouth every 4 (four) hours as needed for moderate pain. Patient taking differently: Take 100 mg by mouth at bedtime.  05/17/16   Tera Partridge, PA    Allergies Penicillins; Bactrim [sulfamethoxazole-trimethoprim]; and Zerit [stavudine]  No family history on file.  Social History Social History   Tobacco Use  . Smoking status: Never Smoker  . Smokeless tobacco: Never Used  Substance Use Topics  . Alcohol use: No    Comment: no alcohol for several months  . Drug use: No    Review of Systems  Level 5 caveat secondary to altered mentation.   ____________________________________________   PHYSICAL EXAM:  VITAL SIGNS: ED Triage Vitals  Enc Vitals Group     BP  02/09/17 1859 126/85     Pulse Rate 02/09/17 1858 65     Resp 02/09/17 1900 (!) 27     Temp 02/09/17 1858 98.2 F (36.8 C)     Temp Source 02/09/17 1858 Oral     SpO2 02/09/17 1858 100 %     Weight 02/09/17 1858 115 lb (52.2 kg)     Height --      Head Circumference --      Peak Flow --      Pain Score 02/09/17 1946 Asleep     Pain Loc --      Pain Edu? --      Excl. in GC? --     Constitutional: Alert and and in no acute distress.  Intermittently answers yes and no questions. Eyes: Conjunctivae are normal.  Head:   Right ear with skin  tear and half a centimeter laceration to the cartilage of the antihelix.  No active bleeding at this time.  Nose: No congestion/rhinnorhea. Mouth/Throat: Mucous membranes are moist.  Neck: No stridor.  Range his neck freely.  No meningismus. Cardiovascular: Normal rate, regular rhythm. Grossly normal heart sounds.  Good peripheral circulation. Respiratory: Normal respiratory effort.  No retractions. Lungs CTAB. Gastrointestinal: Soft and nontender. No distention. No CVA tenderness. Musculoskeletal: No lower extremity tenderness nor edema.  No joint effusions. Neurologic:  No gross focal neurologic deficits are appreciated. Skin:  Skin is warm, dry and intact. No rash noted. Psychiatric: Mood and affect are normal. Speech and behavior are normal.  ____________________________________________   LABS (all labs ordered are listed, but only abnormal results are displayed)  Labs Reviewed  COMPREHENSIVE METABOLIC PANEL - Abnormal; Notable for the following components:      Result Value   Glucose, Bld 126 (*)    All other components within normal limits  CBC - Abnormal; Notable for the following components:   Platelets 116 (*)    All other components within normal limits  ACETAMINOPHEN LEVEL - Abnormal; Notable for the following components:   Acetaminophen (Tylenol), Serum <10 (*)    All other components within normal limits  AMMONIA  TROPONIN I  SALICYLATE LEVEL  ETHANOL  LIPASE, BLOOD  URINE DRUG SCREEN, QUALITATIVE ([facility] ONLY)  CBG MONITORING, ED   ____________________________________________  EKG  ED ECG REPORT I, Arelia Longest, the attending physician, personally viewed and interpreted this ECG.   Date: 02/09/2017  EKG Time: 1902  Rate: 65  Rhythm: normal sinus rhythm  Axis: Normal  Intervals:none  ST&T Change: No ST segment elevation or depression.  No abnormal T wave inversion.  ____________________________________________  RADIOLOGY  No acute intracranial  abnormality on this head CT. ____________________________________________   PROCEDURES  Procedure(s) performed:   Procedures  Critical Care performed:   ____________________________________________   INITIAL IMPRESSION / ASSESSMENT AND PLAN / ED COURSE  Pertinent labs & imaging results that were available during my care of the patient were reviewed by me and considered in my medical decision making (see chart for details).  Differential diagnosis includes, but is not limited to, alcohol, illicit or prescription medications, or other toxic ingestion; intracranial pathology such as stroke or intracerebral hemorrhage; fever or infectious causes including sepsis; hypoxemia and/or hypercarbia; uremia; trauma; endocrine related disorders such as diabetes, hypoglycemia, and thyroid-related diseases; hypertensive encephalopathy; etc.  As part of my medical decision making, I reviewed the following data within the electronic MEDICAL RECORD NUMBER Old chart reviewed   ----------------------------------------- 8:59 PM on 02/09/2017 -----------------------------------------  Patient at  this time still agitated but occasionally answering questions.  Pulling at his pulse oximeter.  I counseled the patient's husband that I did not feel safe at this time repairing the patient's right ear laceration.  The patient is agitated and I feel this is a risk to the patient for a poor outcome and possible in his story needlesticks as well as myself for an occupational exposure.  Husband is understanding and understands the possibility of scarring.  Patient possibly with psychiatric etiology.  However, I cannot say that there is not a medical etiology.  Patient without meningismus.  Rating his head and neck freely.  No fever.  Normal white blood cell count.  Patient will be admitted to the hospital for further workup and treatment.  Patient as well as husband are understanding of this plan willing to comply.  Signed  out to Dr. Katheren Shams.  I will withhold antibiotics at this time as there is not a clear infectious etiology to explain the patient's symptoms.     ____________________________________________   FINAL CLINICAL IMPRESSION(S) / ED DIAGNOSES  Altered mental status    NEW MEDICATIONS STARTED DURING THIS VISIT:  This SmartLink is deprecated. Use AVSMEDLIST instead to display the medication list for a patient.   Note:  This document was prepared using Dragon voice recognition software and may include unintentional dictation errors.     Myrna Blazer, MD 02/09/17 2101

## 2017-02-09 NOTE — ED Triage Notes (Addendum)
Pt to ER with husband c/o altered mental status. Believes he mad have "taken something". Pt confused, unsteady on feet. Laceration to right ear from earlier today.

## 2017-02-09 NOTE — H&P (Signed)
[employer] [facility] [place] [facility]   PATIENT NAME: Roy Trujillo    MR#:  [id number]  DATE OF BIRTH:  12-Jun-1965  DATE OF ADMISSION:  02/09/2017  PRIMARY CARE PHYSICIAN: Mick Sell, MD   REQUESTING/REFERRING PHYSICIAN:   CHIEF COMPLAINT:   Chief Complaint  Patient presents with  . Altered Mental Status    HISTORY OF PRESENT ILLNESS: Roy Trujillo  is a 51 y.o. male with a known history of alcoholism-sober for 2-3 months, chronic marijuana abuse/dependency, presented to the emergency room with 2-3-week history of intermittent erratic behavior as well as depression, patient is a poor historian due to altered mental status, information is coming from his husband whom is a good historian, patient over the last 2-3 weeks has had periods of alternating mental status, ataxia, has been taking more of his HIV medications intermittently-states that this gives him a high, has also had periods of weakness during this time, in the emergency room workup noted for marijuana urine drug screen, chest x-ray was negative, CT head unimpressive, patient evaluated in the emergency room, patient able to answer simple questions, following commands intermittently, is a poor historian, the patient's speech is normal, intermittently is able to tell us what he wants, he denies pain, they deny any infectious symptomatology, patient had fallen earlier this morning resulted in right ear laceration, patient is now been admitted for acute encephalopathy suspected related to medication side effect.  PAST MEDICAL HISTORY:   Past Medical History:  Diagnosis Date  . Avascular necrosis (HCC)   . Depression   . GERD (gastroesophageal reflux disease)   . HIV (human immunodeficiency virus infection) (HCC)     PAST SURGICAL HISTORY:  Past Surgical History:  Procedure Laterality Date  . CHOLECYSTECTOMY  2013  . HERNIA REPAIR  1974   double inguinal  . JOINT REPLACEMENT Left    left hip   . TONSILLECTOMY     as a child  . TOTAL HIP ARTHROPLASTY Right 05/15/2016   Procedure: TOTAL HIP ARTHROPLASTY;  Surgeon: Donato Heinz, MD;  Location: [facility] ORS;  Service: Orthopedics;  Laterality: Right;    SOCIAL HISTORY:  Social History   Tobacco Use  . Smoking status: Never Smoker  . Smokeless tobacco: Never Used  Substance Use Topics  . Alcohol use: No    Comment: no alcohol for several months    FAMILY HISTORY: No family history on file.  DRUG ALLERGIES:  Allergies  Allergen Reactions  . Penicillins Other (See Comments)    Unknown childhood reaction Has patient had a PCN reaction causing immediate rash, facial/tongue/throat swelling, SOB or lightheadedness with hypotension: Unknown Has patient had a PCN reaction causing severe rash involving mucus membranes or skin necrosis: Unknown Has patient had a PCN reaction that required hospitalization: Unknown Has patient had a PCN reaction occurring within the last 10 years: Unknown If all of the above answers are "NO", then may proceed with Cephalosporin use.   . Bactrim [Sulfamethoxazole-Trimethoprim] Rash  . Zerit [Stavudine] Rash    REVIEW OF SYSTEMS:  Unable to be obtained given encephalopathy/altered mental status  MEDICATIONS AT HOME:  Prior to Admission medications   Medication Sig Start Date End Date Taking? Authorizing Provider  DESCOVY 200-25 MG tablet Take 1 tablet by mouth daily. 10/01/16   [provider]  dolutegravir (TIVICAY) 50 MG tablet Take 1 tablet by mouth 2 (two) times daily. 03/27/15   [provider]  promethazine (PHENERGAN) 25 MG tablet Take 1 tablet (25  mg total) by mouth every 4 (four) hours as needed for nausea or vomiting. 04/19/15   Emily Filbert, MD  traMADol (ULTRAM) 50 MG tablet Take 1-2 tablets (50-100 mg total) by mouth every 4 (four) hours as needed for moderate pain. Patient taking differently: Take 100 mg by mouth at bedtime.  05/17/16   Tera Partridge, PA       PHYSICAL EXAMINATION:   VITAL SIGNS: Blood pressure 102/63, pulse (!) 58, temperature 98.2 F (36.8 C), temperature source Oral, resp. rate 11, weight 52.2 kg (115 lb), SpO2 100 %.  GENERAL:  51 y.o.-year-old patient lying in the bed with no acute distress.  Nontoxic-appearing EYES: Pupils equal, round, reactive to light and accommodation. No scleral icterus. Extraocular muscles intact.   HEENT: Head atraumatic, normocephalic. Oropharynx and nasopharynx clear.  Right ear skin tear NECK:  Supple, no jugular venous distention. No thyroid enlargement, no tenderness.  LUNGS: Normal breath sounds bilaterally, no wheezing, rales,rhonchi or crepitation. No use of accessory muscles of respiration.  CARDIOVASCULAR: S1, S2 normal. No murmurs, rubs, or gallops.  ABDOMEN: Soft, nontender, nondistended. Bowel sounds present. No organomegaly or mass.  EXTREMITIES: No pedal edema, cyanosis, or clubbing.  NEUROLOGIC: PERRL, MAES, normal speech PSYCHIATRIC: Mild lethargy, slow mentation, altered mental status  SKIN: No obvious rash, lesion, or ulcer.   LABORATORY PANEL:   CBC Recent Labs  Lab 02/09/17 1859  WBC 5.6  HGB 15.2  HCT 44.9  PLT 116*  MCV 96.9  MCH 32.7  MCHC 33.8  RDW 13.7   ------------------------------------------------------------------------------------------------------------------  Chemistries  Recent Labs  Lab 02/09/17 1859  NA 143  K 3.7  CL 106  CO2 27  GLUCOSE 126*  BUN 8  CREATININE 0.78  CALCIUM 8.9  AST 35  ALT 42  ALKPHOS 74  BILITOT 0.9   ------------------------------------------------------------------------------------------------------------------ estimated creatinine clearance is 80.7 mL/min (by C-G formula based on SCr of 0.78 mg/dL). ------------------------------------------------------------------------------------------------------------------ No results for input(s): TSH, T4TOTAL, T3FREE, THYROIDAB in the last 72 hours.  Invalid  input(s): FREET3   Coagulation profile No results for input(s): INR, PROTIME in the last 168 hours. ------------------------------------------------------------------------------------------------------------------- No results for input(s): DDIMER in the last 72 hours. -------------------------------------------------------------------------------------------------------------------  Cardiac Enzymes Recent Labs  Lab 02/09/17 1902  TROPONINI <0.03   ------------------------------------------------------------------------------------------------------------------ Invalid input(s): POCBNP  ---------------------------------------------------------------------------------------------------------------  Urinalysis    Component Value Date/Time   COLORURINE COLORLESS (A) 11/26/2016 1123   APPEARANCEUR CLEAR (A) 11/26/2016 1123   LABSPEC 1.004 (L) 11/26/2016 1123   PHURINE 8.0 11/26/2016 1123   GLUCOSEU NEGATIVE 11/26/2016 1123   HGBUR NEGATIVE 11/26/2016 1123   BILIRUBINUR NEGATIVE 11/26/2016 1123   KETONESUR 20 (A) 11/26/2016 1123   PROTEINUR NEGATIVE 11/26/2016 1123   NITRITE NEGATIVE 11/26/2016 1123   LEUKOCYTESUR NEGATIVE 11/26/2016 1123     RADIOLOGY: Dg Chest 1 View  Result Date: 02/09/2017 CLINICAL DATA:  Altered mental status EXAM: CHEST 1 VIEW COMPARISON:  November 26, 2016 FINDINGS: There is no edema or consolidation. The heart size and pulmonary vascularity are normal. No adenopathy. No bone lesions. IMPRESSION: No edema or consolidation. Electronically Signed   By: Bretta Bang III M.D.   On: 02/09/2017 19:51   Ct Head Wo Contrast  Result Date: 02/09/2017 CLINICAL DATA:  Altered mental status. RIGHT ear laceration. History of HIV, alcohol abuse. EXAM: CT HEAD WITHOUT CONTRAST TECHNIQUE: Contiguous axial images were obtained from the base of the skull through the vertex without intravenous contrast. COMPARISON:  None. FINDINGS: BRAIN: No intraparenchymal  hemorrhage, mass effect  nor midline shift. The ventricles and sulci are normal. No acute large vascular territory infarcts. No abnormal extra-axial fluid collections. Basal cisterns are patent. VASCULAR: Trace calcific atherosclerosis carotid siphon. SKULL/SOFT TISSUES: No skull fracture. Minimal RIGHT temporal scalp soft tissue swelling without subcutaneous gas or radiopaque foreign bodies. ORBITS/SINUSES: The included ocular globes and orbital contents are normal.Trace paranasal sinus mucosal thickening without air-fluid levels. Included mastoid air cells are well aerated. OTHER: None. IMPRESSION: 1. Minimal RIGHT temporal scalp soft tissue swelling. 2. Otherwise negative noncontrast CT HEAD. Electronically Signed   By: Awilda Metro M.D.   On: 02/09/2017 19:49    EKG: Orders placed or performed during the hospital encounter of 02/09/17  . EKG 12-Lead  . EKG 12-Lead    IMPRESSION AND PLAN: 1 acute encephalopathy Suspect related to medication side effect-patient taking increasing doses of his HIV medication to get a high, on tramadol, noted history of alcoholism, and chronic marijuana abuse Admit to regular nursing floor bed, neurochecks per routine, aspiration/fall precautions, head of bed at 30 degrees at all times, consult neurology for expert opinion, check MRI of the brain, hold HIV medications as well as tramadol, avoid psychotropic agents, check EEG, RPR, ammonia level, rule out acute coronary syndrome with cardiac enzymes x3 sets, IV fluids for rehydration, and continue close medical monitoring  2 chronic HIV infection Hold antiretroviral agents as stated above Check CD4 count  3 history of alcoholism Sober for approximately 2-3 months per significant other We will place on alcohol withdrawal protocol  4 chronic GERD without esophagitis PPI daily  Full code Condition stable Prognosis fair DVT prophylaxis with Lovenox subcu Disposition home in 1-2 days barring any  complications  All the records are reviewed and case discussed with ED provider. Management plans discussed with the patient, family and they are in agreement.  CODE STATUS: Code Status History    Date Active Date Inactive Code Status Order ID Comments User Context   05/15/2016 13:23 05/17/2016 14:28 Full Code [id number]  Donato Heinz, MD Inpatient       TOTAL TIME TAKING CARE OF THIS PATIENT: 45 minutes.    Evelena Asa Brittania Sudbeck M.D on 02/09/2017   Between 7am to 6pm - Pager - [phone]  After 6pm go to [email or web address] - password [email or web address]  [facility]  Office  [phone]  CC: Primary care physician; Mick Sell, MD   Note: This dictation was prepared with Dragon dictation along with smaller phrase technology. Any transcriptional errors that result from this process are unintentional.

## 2017-02-09 NOTE — ED Notes (Signed)
Pt assisted to use urinal to void 

## 2017-02-10 ENCOUNTER — Inpatient Hospital Stay (HOSPITAL_COMMUNITY): Payer: Medicare Other

## 2017-02-10 ENCOUNTER — Inpatient Hospital Stay: Payer: Medicare Other

## 2017-02-10 DIAGNOSIS — T4271XA Poisoning by unspecified antiepileptic and sedative-hypnotic drugs, accidental (unintentional), initial encounter: Secondary | ICD-10-CM

## 2017-02-10 DIAGNOSIS — R4182 Altered mental status, unspecified: Secondary | ICD-10-CM

## 2017-02-10 DIAGNOSIS — F332 Major depressive disorder, recurrent severe without psychotic features: Secondary | ICD-10-CM

## 2017-02-10 LAB — AMMONIA: AMMONIA: 14 umol/L (ref 9–35)

## 2017-02-10 LAB — TROPONIN I
Troponin I: <0.03 ng/mL (ref <0.03)
Troponin I: <0.03 ng/mL (ref <0.03)

## 2017-02-10 LAB — TSH: TSH: 0.809 u[IU]/mL (ref 0.350–4.500)

## 2017-02-10 MED ORDER — TRAZODONE HCL 50 MG PO TABS
200.0000 mg | ORAL_TABLET | Freq: Every day | ORAL | Status: DC
  Administered 2017-02-10: 22:00:00 200 mg via ORAL
  Filled 2017-02-10: qty 4

## 2017-02-10 MED ORDER — BENZOCAINE 10 % MT GEL
Freq: Four times a day (QID) | OROMUCOSAL | Status: DC | PRN
  Administered 2017-02-10: 08:00:00 via OROMUCOSAL
  Administered 2017-02-11: 1 via OROMUCOSAL
  Filled 2017-02-10: qty 9

## 2017-02-10 MED ORDER — DOLUTEGRAVIR SODIUM 50 MG PO TABS
50.0000 mg | ORAL_TABLET | Freq: Two times a day (BID) | ORAL | Status: DC
  Administered 2017-02-10 – 2017-02-11 (×2): 50 mg via ORAL
  Filled 2017-02-10 (×2): qty 1

## 2017-02-10 MED ORDER — FLUOXETINE HCL 20 MG PO CAPS
20.0000 mg | ORAL_CAPSULE | Freq: Every day | ORAL | Status: DC
Start: 2017-02-11 — End: 2017-02-11
  Administered 2017-02-11: 20 mg via ORAL
  Filled 2017-02-10: qty 1

## 2017-02-10 MED ORDER — EMTRICITABINE-TENOFOVIR AF 200-25 MG PO TABS
1.0000 | ORAL_TABLET | Freq: Every day | ORAL | Status: DC
  Administered 2017-02-10 – 2017-02-11 (×2): 1 via ORAL
  Filled 2017-02-10 (×2): qty 1

## 2017-02-10 MED ORDER — CLINDAMYCIN HCL 150 MG PO CAPS
150.0000 mg | ORAL_CAPSULE | Freq: Three times a day (TID) | ORAL | Status: DC
  Administered 2017-02-10 – 2017-02-11 (×3): 150 mg via ORAL
  Filled 2017-02-10 (×4): qty 1

## 2017-02-10 NOTE — Consult Note (Signed)
Roy Trujillo, Roy Trujillo [id number] 1965-12-24 Roy Trujillo, *  Reason for Consult: Evaluate right auricle wound  HPI: The patient is a 51 year old white male who was admitted the hospital several days ago because of lethargy. He had a fall and hit his right ear. He had laceration with flap was just dressed in the emergency room. It is been little bit tender. He has been trying not to lie on that side. He has not had any changes hearing. He has not had lacerations here previously.  Allergies:  Allergies  Allergen Reactions  . Penicillins Other (See Comments)    Unknown childhood reaction Has patient had a PCN reaction causing immediate rash, facial/tongue/throat swelling, SOB or lightheadedness with hypotension: Unknown Has patient had a PCN reaction causing severe rash involving mucus membranes or skin necrosis: Unknown Has patient had a PCN reaction that required hospitalization: Unknown Has patient had a PCN reaction occurring within the last 10 years: Unknown If all of the above answers are "NO", then may proceed with Cephalosporin use.   . Bactrim [Sulfamethoxazole-Trimethoprim] Rash  . Zerit [Stavudine] Rash    ROS: Review of systems normal other than 12 systems except per HPI.  PMH:  Past Medical History:  Diagnosis Date  . Avascular necrosis (HCC)   . Depression   . GERD (gastroesophageal reflux disease)   . HIV (human immunodeficiency virus infection) (HCC)     FH: No family history on file.  SH:  Social History   Socioeconomic History  . Marital status: Married    Spouse name: Not on file  . Number of children: Not on file  . Years of education: Not on file  . Highest education level: Not on file  Social Needs  . Financial resource strain: Not on file  . Food insecurity - worry: Not on file  . Food insecurity - inability: Not on file  . Transportation needs - medical: Not on file  . Transportation needs - non-medical: Not on file  Occupational History   . Not on file  Tobacco Use  . Smoking status: Never Smoker  . Smokeless tobacco: Never Used  Substance and Sexual Activity  . Alcohol use: No    Comment: no alcohol for several months  . Drug use: No  . Sexual activity: Yes  Other Topics Concern  . Not on file  Social History Narrative  . Not on file    PSH:  Past Surgical History:  Procedure Laterality Date  . CHOLECYSTECTOMY  2013  . HERNIA REPAIR  1974   double inguinal  . JOINT REPLACEMENT Left    left hip  . TONSILLECTOMY     as a child  . TOTAL HIP ARTHROPLASTY Right 05/15/2016   Procedure: TOTAL HIP ARTHROPLASTY;  Surgeon: Donato Heinz, MD;  Location: [facility] ORS;  Service: Orthopedics;  Laterality: Right;    Physical  Exam: Is a well-developed, well-nourished white male in no acute distress. He is very coherent and responds appropriately. He is cooperative. His right auricle has a little bit of a clear bandage wrapped around the outer edge of it. The anterior bandage is removed to reveal an inferiorly based flap that is about 15 mm in length and 5 mm in width. This appears to be avulsed from the anterior antihelix with Justin inferior attachment. This appears to be laid back into its natural position and had the dressing placed over it to hold it in place. The flap has good vascular turned and looks very healthy. It is stuck  in position and has healed enough that it does not need any stitches at all right now. His ear canal appears to be clear and there is no injury to the cartilage of the auricle. His other ear looks clear and there is no other injuries noted of his face.   A/P: He had a superficial laceration of his right auricle that has now been placed in position is stuck down well. We'll keep the bandage on this and given him instructions over the next week or 2 how to care for this. This should heal well and sewn with no further chin. He'll keep it dry for the next 1-2 weeks while these getting his hair washed and  washings face. He is welcome to return if there are any problems with wound but does not need any kind of routine follow-up with ENT unless there are problems.   Roy Trujillo Roy Trujillo 02/10/2017 3:57 PM

## 2017-02-10 NOTE — Progress Notes (Signed)
Patient arrived to 1C Room 106. Patient denies pain and all questions answered. Patient oriented to unit and use of call bell/room phone. Skin assessment completed with Roy So RN and laceration noted to R ear; mepitel applied to wound. A&Ox4 and VSS. Husband at bedside. Nursing staff will continue to monitor for any changes in patient status. Roy Richer, RN

## 2017-02-10 NOTE — Progress Notes (Signed)
[facility] [place] [facility]   PATIENT NAME: Roy Trujillo    MR#:  [id number]  DATE OF BIRTH:  01-10-1966  SUBJECTIVE:  CHIEF COMPLAINT:   Chief Complaint  Patient presents with  . Altered Mental Status     Came with altered mental status, due to taking his meds irregularly and doing overdose.  had a fall and having torn right ear skin.  REVIEW OF SYSTEMS:  CONSTITUTIONAL: No fever, fatigue or weakness.  EYES: No blurred or double vision.  EARS, NOSE, AND THROAT: No tinnitus or ear pain.  RESPIRATORY: No cough, shortness of breath, wheezing or hemoptysis.  CARDIOVASCULAR: No chest pain, orthopnea, edema.  GASTROINTESTINAL: No nausea, vomiting, diarrhea or abdominal pain.  GENITOURINARY: No dysuria, hematuria.  ENDOCRINE: No polyuria, nocturia,  HEMATOLOGY: No anemia, easy bruising or bleeding SKIN: No rash or lesion. MUSCULOSKELETAL: No joint pain or arthritis.   NEUROLOGIC: No tingling, numbness, weakness.  PSYCHIATRY: No anxiety or depression.   ROS  DRUG ALLERGIES:   Allergies  Allergen Reactions  . Penicillins Other (See Comments)    Unknown childhood reaction Has patient had a PCN reaction causing immediate rash, facial/tongue/throat swelling, SOB or lightheadedness with hypotension: Unknown Has patient had a PCN reaction causing severe rash involving mucus membranes or skin necrosis: Unknown Has patient had a PCN reaction that required hospitalization: Unknown Has patient had a PCN reaction occurring within the last 10 years: Unknown If all of the above answers are "NO", then may proceed with Cephalosporin use.   . Bactrim [Sulfamethoxazole-Trimethoprim] Rash  . Zerit [Stavudine] Rash    VITALS:  Blood pressure (!) 172/84, pulse 90, temperature 98.8 F (37.1 C), temperature source Oral, resp. rate 18, height 5\' 6"  (1.676 m), weight 55.6 kg (122 lb 8 oz), SpO2 98 %.  PHYSICAL EXAMINATION:  GENERAL:  51 y.o.-year-old patient lying  in the bed with no acute distress.  EYES: Pupils equal, round, reactive to light and accommodation. No scleral icterus. Extraocular muscles intact.  HEENT: Head atraumatic, normocephalic. Oropharynx and nasopharynx clear. Right ear have laceration. NECK:  Supple, no jugular venous distention. No thyroid enlargement, no tenderness.  LUNGS: Normal breath sounds bilaterally, no wheezing, rales,rhonchi or crepitation. No use of accessory muscles of respiration.  CARDIOVASCULAR: S1, S2 normal. No murmurs, rubs, or gallops.  ABDOMEN: Soft, nontender, nondistended. Bowel sounds present. No organomegaly or mass.  EXTREMITIES: No pedal edema, cyanosis, or clubbing.  NEUROLOGIC: Cranial nerves II through XII are intact. Muscle strength 5/5 in all extremities. Sensation intact. Gait not checked.  PSYCHIATRIC: The patient is alert and oriented x 3.  SKIN: No obvious rash, lesion, or ulcer.   Physical Exam LABORATORY PANEL:   CBC Recent Labs  Lab 02/09/17 1859  WBC 5.6  HGB 15.2  HCT 44.9  PLT 116*   ------------------------------------------------------------------------------------------------------------------  Chemistries  Recent Labs  Lab 02/09/17 1859  NA 143  K 3.7  CL 106  CO2 27  GLUCOSE 126*  BUN 8  CREATININE 0.78  CALCIUM 8.9  AST 35  ALT 42  ALKPHOS 74  BILITOT 0.9   ------------------------------------------------------------------------------------------------------------------  Cardiac Enzymes Recent Labs  Lab 02/10/17 0503 02/10/17 1121  TROPONINI <0.03 <0.03   ------------------------------------------------------------------------------------------------------------------  RADIOLOGY:  Dg Chest 1 View  Result Date: 02/09/2017 CLINICAL DATA:  Altered mental status EXAM: CHEST 1 VIEW COMPARISON:  November 26, 2016 FINDINGS: There is no edema or consolidation. The heart size and pulmonary vascularity are normal. No adenopathy. No bone lesions. IMPRESSION:  No  edema or consolidation. Electronically Signed   By: Bretta Bang III M.D.   On: 02/09/2017 19:51   Ct Head Wo Contrast  Result Date: 02/09/2017 CLINICAL DATA:  Altered mental status. RIGHT ear laceration. History of HIV, alcohol abuse. EXAM: CT HEAD WITHOUT CONTRAST TECHNIQUE: Contiguous axial images were obtained from the base of the skull through the vertex without intravenous contrast. COMPARISON:  None. FINDINGS: BRAIN: No intraparenchymal hemorrhage, mass effect nor midline shift. The ventricles and sulci are normal. No acute large vascular territory infarcts. No abnormal extra-axial fluid collections. Basal cisterns are patent. VASCULAR: Trace calcific atherosclerosis carotid siphon. SKULL/SOFT TISSUES: No skull fracture. Minimal RIGHT temporal scalp soft tissue swelling without subcutaneous gas or radiopaque foreign bodies. ORBITS/SINUSES: The included ocular globes and orbital contents are normal.Trace paranasal sinus mucosal thickening without air-fluid levels. Included mastoid air cells are well aerated. OTHER: None. IMPRESSION: 1. Minimal RIGHT temporal scalp soft tissue swelling. 2. Otherwise negative noncontrast CT HEAD. Electronically Signed   By: Awilda Metro M.D.   On: 02/09/2017 19:49   Mr Brain Wo Contrast  Result Date: 02/10/2017 CLINICAL DATA:  51 year old male with HIV, recent altered mental status and erratic behavior. EXAM: MRI HEAD WITHOUT CONTRAST TECHNIQUE: Multiplanar, multiecho pulse sequences of the brain and surrounding structures were obtained without intravenous contrast. COMPARISON:  Head CT without contrast 02/09/2017. FINDINGS: Brain: Cerebral volume is within normal limits. No restricted diffusion to suggest acute infarction. No midline shift, mass effect, evidence of mass lesion, ventriculomegaly, extra-axial collection or acute intracranial hemorrhage. Cervicomedullary junction and pituitary are within normal limits. Wallace Cullens and white matter signal is within  normal limits throughout the brain. No encephalomalacia or chronic cerebral blood products identified. No contrast administered. Vascular: Major intracranial vascular flow voids are preserved. Skull and upper cervical spine: Negative visualized cervical spine. Normal bone marrow signal. Sinuses/Orbits: Negative orbits soft tissues. Mild ethmoid and maxillary sinus mucosal thickening, appears inconsequential. Other: Visible internal auditory structures appear normal. Mastoid air cells are clear. There is susceptibility artifact about the right pinna which appears probably related to an earring seen on the CT scout view yesterday. No persistent right side scalp soft tissue swelling or abnormality is identified. Visible scalp and face soft tissues today appear negative. IMPRESSION: No acute intracranial abnormality. Normal noncontrast MRI appearance of the brain. Electronically Signed   By: Odessa Fleming M.D.   On: 02/10/2017 09:40    ASSESSMENT AND PLAN:   Active Problems:   Encephalopathy acute   Altered mental status  1 acute encephalopathy Suspect related to medication side effect-patient taking increasing doses of his HIV medication to get a high, on tramadol, noted history of alcoholism, and chronic marijuana abuse  neurochecks per routine, aspiration/fall precautions, consult neurology for expert opinion, negative MRI of the brain,  His ammonia is not high.  After 24 hrs- completely alert and oriented, resume HIV meds.  His husband confirms him having depression and doing overdose on some of his meds.  Called psych eval.  2 chronic HIV infection Resume HIV meds.  3 history of alcoholism Sober for approximately 2-3 months per significant other  on alcohol withdrawal protocol, but no signs of withdrawal  4 chronic GERD without esophagitis PPI daily   All the records are reviewed and case discussed with Care Management/Social Workerr. Management plans discussed with the patient, family  and they are in agreement.  CODE STATUS: Full.  TOTAL TIME TAKING CARE OF THIS PATIENT: 35 minutes.    POSSIBLE D/C IN 1-2 DAYS,  DEPENDING ON CLINICAL CONDITION.   [name] M.D on 02/10/2017   Between 7am to 6pm - Pager - [phone]  After 6pm go to [email or web address] - password [email or web address]  [facility]  Office  [phone]  CC: Primary care physician; Mick Sell, MD  Note: This dictation was prepared with Dragon dictation along with smaller phrase technology. Any transcriptional errors that result from this process are unintentional.

## 2017-02-10 NOTE — Progress Notes (Signed)
[facility] provided Healthcare Power of Attorney Education to patient and patient's friend. [facility] offered emotional support and is available for further consults upon requests. Patient will complete paperwork outside hospital and return at next appointment.   02/10/17 0925  Clinical Encounter Type  Visited With Patient;Patient and family together  Visit Type Initial;Spiritual support  Referral From Nurse

## 2017-02-10 NOTE — Consult Note (Signed)
[facility] Consult   Reason for Consult: Consult for 51 year old man who came into the hospital with acute mental status change Referring Physician:  Marthann Schiller Patient Identification: Roy Trujillo MRN:  [id number] Principal Diagnosis: Severe recurrent major depression without psychotic features [facility]) Diagnosis:   Patient Active Problem List   Diagnosis Date Noted  . Severe recurrent major depression without psychotic features ([place]) [F33.2] 02/10/2017  . Overdose of sedative or hypnotic [T42.71XA] 02/10/2017  . Altered mental status [R41.82]   . Encephalopathy acute [G93.40] 02/09/2017  . Alcohol abuse [F10.10] 05/15/2016  . Depression [F32.9] 05/15/2016  . S/P total hip arthroplasty [Z96.649] 05/15/2016  . Avascular necrosis of bone of hip, right ([place]) [M87.051] 03/31/2016  . HIV infection ([place]) [B20] 03/04/1996    Total Time spent with patient: 1 hour  Subjective:   Roy Trujillo is a 51 y.o. male patient admitted with "I honestly just wanted to go to sleep".  HPI: Patient interviewed chart reviewed.  51 year old man came into the hospital with altered mental status.  I found him to be awake alert and appropriately interactive this evening.  Patient tells me that he woke up on the day in question and found himself feeling more depressed than usual.  He was thinking about his dog who died 3 years ago which is still the thing that makes him the most sad.  He says that he wanted to just put himself back to sleep during the day and so he took a double dose of trazodone and Benadryl during the daytime.  He denies that he was trying to kill himself or having any wish to die.  Denies that he had used alcohol or any other drugs.  He admits however that his mood is been depressed down and negative probably for more than a year but has been worse for the last few months.  He rarely gets out of bed has very little activity very little positive going on in his life.   Frequently feels irritable.  He used to have a problem with alcohol and stopped drinking 3 or 4 months ago.  Denies any hallucinations or psychotic symptoms he denies any attempts to kill himself or thoughts of hurting anyone else.  Social history: Patient does not work.  He and his husband live together and are taking care of his husband's disabled brother.  Patient lost his mother within the last couple years which was very sad for him.  Also grieves to a great extent for his loss dog.  Medical history: HIV positive for over 20 years.  Seen for outpatient management.  Compliant with his HIV medicine.  No other active medical problems other than arthritis  Substance abuse history: The years he drank too much but stopped a few months ago.  His husband reports that he thinks that since then he has substituted misuse of his sleeping pills for the alcohol.  He still uses marijuana about once a month.  Past Psychiatric History: No history of suicide attempts.  One psychiatric hospitalization at age 21 when he came out to his family but at that time he was not suicidal and appears to have not actually had an illness.  Never apparently been on any antidepressant except the trazodone that he takes for sleep.  No history of psychosis no history of mania.  No known family history of mental health problems  Risk to Self: Is patient at risk for suicide?: No Risk to Others:   Prior Inpatient Therapy:  Prior Outpatient Therapy:    Past Medical History:  Past Medical History:  Diagnosis Date  . Avascular necrosis ([place])   . Depression   . GERD (gastroesophageal reflux disease)   . HIV (human immunodeficiency virus infection) ([place])     Past Surgical History:  Procedure Laterality Date  . CHOLECYSTECTOMY  2013  . HERNIA REPAIR  1974   double inguinal  . JOINT REPLACEMENT Left    left hip  . TONSILLECTOMY     as a child  . TOTAL HIP ARTHROPLASTY Right 05/15/2016   Procedure: TOTAL HIP ARTHROPLASTY;   Surgeon: Dereck Leep, MD;  Location: [facility] ORS;  Service: Orthopedics;  Laterality: Right;   Family History: No family history on file. Family Psychiatric  History: No known family history Social History:  Social History   Substance and Sexual Activity  Alcohol Use No   Comment: no alcohol for several months     Social History   Substance and Sexual Activity  Drug Use No    Social History   Socioeconomic History  . Marital status: Married    Spouse name: None  . Number of children: None  . Years of education: None  . Highest education level: None  Social Needs  . Financial resource strain: None  . Food insecurity - worry: None  . Food insecurity - inability: None  . Transportation needs - medical: None  . Transportation needs - non-medical: None  Occupational History  . None  Tobacco Use  . Smoking status: Never Smoker  . Smokeless tobacco: Never Used  Substance and Sexual Activity  . Alcohol use: No    Comment: no alcohol for several months  . Drug use: No  . Sexual activity: Yes  Other Topics Concern  . None  Social History Narrative  . None   Additional Social History:    Allergies:   Allergies  Allergen Reactions  . Penicillins Other (See Comments)    Unknown childhood reaction Has patient had a PCN reaction causing immediate rash, facial/tongue/throat swelling, SOB or lightheadedness with hypotension: Unknown Has patient had a PCN reaction causing severe rash involving mucus membranes or skin necrosis: Unknown Has patient had a PCN reaction that required hospitalization: Unknown Has patient had a PCN reaction occurring within the last 10 years: Unknown If all of the above answers are "NO", then may proceed with Cephalosporin use.   . Bactrim [Sulfamethoxazole-Trimethoprim] Rash  . Zerit [Stavudine] Rash    Labs:  Results for orders placed or performed during the hospital encounter of 02/09/17 (from the past 48 hour(s))  Comprehensive metabolic  panel     Status: Abnormal   Collection Time: 02/09/17  6:59 PM  Result Value Ref Range   Sodium 143 135 - 145 mmol/L   Potassium 3.7 3.5 - 5.1 mmol/L   Chloride 106 101 - 111 mmol/L   CO2 27 22 - 32 mmol/L   Glucose, Bld 126 (H) 65 - 99 mg/dL   BUN 8 6 - 20 mg/dL   Creatinine, Ser 0.78 0.61 - 1.24 mg/dL   Calcium 8.9 8.9 - 10.3 mg/dL   Total Protein 7.2 6.5 - 8.1 g/dL   Albumin 4.5 3.5 - 5.0 g/dL   AST 35 15 - 41 U/L   ALT 42 17 - 63 U/L   Alkaline Phosphatase 74 38 - 126 U/L   Total Bilirubin 0.9 0.3 - 1.2 mg/dL   GFR calc non Af Amer >60 >60 mL/min   GFR calc Af Amer >  60 >60 mL/min    Comment: (NOTE) The eGFR has been calculated using the CKD EPI equation. This calculation has not been validated in all clinical situations. eGFR's persistently <60 mL/min signify possible Chronic Kidney Disease.    Anion gap 10 5 - 15  CBC     Status: Abnormal   Collection Time: 02/09/17  6:59 PM  Result Value Ref Range   WBC 5.6 3.8 - 10.6 K/uL   RBC 4.64 4.40 - 5.90 MIL/uL   Hemoglobin 15.2 13.0 - 18.0 g/dL   HCT 44.9 40.0 - 52.0 %   MCV 96.9 80.0 - 100.0 fL   MCH 32.7 26.0 - 34.0 pg   MCHC 33.8 32.0 - 36.0 g/dL   RDW 13.7 11.5 - 14.5 %   Platelets 116 (L) 150 - 440 K/uL  Troponin I     Status: None   Collection Time: 02/09/17  7:02 PM  Result Value Ref Range   Troponin I <0.03 <0.03 ng/mL  Acetaminophen level     Status: Abnormal   Collection Time: 02/09/17  7:02 PM  Result Value Ref Range   Acetaminophen (Tylenol), Serum <10 (L) 10 - 30 ug/mL    Comment:        THERAPEUTIC CONCENTRATIONS VARY SIGNIFICANTLY. A RANGE OF 10-30 ug/mL MAY BE AN EFFECTIVE CONCENTRATION FOR MANY PATIENTS. HOWEVER, SOME ARE BEST TREATED AT CONCENTRATIONS OUTSIDE THIS RANGE. ACETAMINOPHEN CONCENTRATIONS >150 ug/mL AT 4 HOURS AFTER INGESTION AND >50 ug/mL AT 12 HOURS AFTER INGESTION ARE OFTEN ASSOCIATED WITH TOXIC REACTIONS.   Salicylate level     Status: None   Collection Time: 02/09/17  7:02  PM  Result Value Ref Range   Salicylate Lvl <7.5 2.8 - 30.0 mg/dL  Ethanol     Status: None   Collection Time: 02/09/17  7:02 PM  Result Value Ref Range   Alcohol, Ethyl (B) <10 <10 mg/dL    Comment:        LOWEST DETECTABLE LIMIT FOR SERUM ALCOHOL IS 10 mg/dL FOR MEDICAL PURPOSES ONLY   Lipase, blood     Status: None   Collection Time: 02/09/17  7:02 PM  Result Value Ref Range   Lipase 20 11 - 51 U/L  Ammonia     Status: None   Collection Time: 02/09/17  7:14 PM  Result Value Ref Range   Ammonia 27 9 - 35 umol/L  Urine Drug Screen, Qualitative ([facility] only)     Status: Abnormal   Collection Time: 02/09/17  7:42 PM  Result Value Ref Range   Tricyclic, Ur Screen NONE DETECTED NONE DETECTED   Amphetamines, Ur Screen NONE DETECTED NONE DETECTED   MDMA (Ecstasy)Ur Screen NONE DETECTED NONE DETECTED   Cocaine Metabolite,Ur [place] NONE DETECTED NONE DETECTED   Opiate, Ur Screen NONE DETECTED NONE DETECTED   Phencyclidine (PCP) Ur S NONE DETECTED NONE DETECTED   Cannabinoid 50 Ng, Ur [place] POSITIVE (A) NONE DETECTED   Barbiturates, Ur Screen NONE DETECTED NONE DETECTED   Benzodiazepine, Ur Scrn NONE DETECTED NONE DETECTED   Methadone Scn, Ur NONE DETECTED NONE DETECTED    Comment: (NOTE) 170  Tricyclics, urine               Cutoff 1000 ng/mL 200  Amphetamines, urine             Cutoff 1000 ng/mL 300  MDMA (Ecstasy), urine           Cutoff 500 ng/mL 400  Cocaine Metabolite, urine  Cutoff 300 ng/mL 500  Opiate, urine                   Cutoff 300 ng/mL 600  Phencyclidine (PCP), urine      Cutoff 25 ng/mL 700  Cannabinoid, urine              Cutoff 50 ng/mL 800  Barbiturates, urine             Cutoff 200 ng/mL 900  Benzodiazepine, urine           Cutoff 200 ng/mL 1000 Methadone, urine                Cutoff 300 ng/mL 1100 1200 The urine drug screen provides only a preliminary, unconfirmed 1300 analytical test result and should not be used for non-medical 1400 purposes. Clinical  consideration and professional judgment should 1500 be applied to any positive drug screen result due to possible 1600 interfering substances. A more specific alternate chemical method 1700 must be used in order to obtain a confirmed analytical result.  1800 Gas chromato graphy / mass spectrometry (GC/MS) is the preferred 1900 confirmatory method.   TSH     Status: None   Collection Time: 02/09/17 11:41 PM  Result Value Ref Range   TSH 0.809 0.350 - 4.500 uIU/mL    Comment: Performed by a 3rd Generation assay with a functional sensitivity of <=0.01 uIU/mL.  Troponin I     Status: None   Collection Time: 02/09/17 11:41 PM  Result Value Ref Range   Troponin I <0.03 <0.03 ng/mL  Ammonia     Status: None   Collection Time: 02/09/17 11:41 PM  Result Value Ref Range   Ammonia 14 9 - 35 umol/L  Troponin I     Status: None   Collection Time: 02/10/17  5:03 AM  Result Value Ref Range   Troponin I <0.03 <0.03 ng/mL  Troponin I     Status: None   Collection Time: 02/10/17 11:21 AM  Result Value Ref Range   Troponin I <0.03 <0.03 ng/mL    Current Facility-Administered Medications  Medication Dose Route Frequency Provider Last Rate Last Dose  . acetaminophen (TYLENOL) tablet 650 mg  650 mg Oral Q6H PRN Salary, Montell D, MD       Or  . acetaminophen (TYLENOL) suppository 650 mg  650 mg Rectal Q6H PRN Salary, Montell D, MD      . benzocaine (ORAJEL) 10 % mucosal gel   Mouth/Throat QID PRN Pyreddy, Pavan, MD      . clindamycin (CLEOCIN) capsule 150 mg  150 mg Oral Q8H Vaughan Basta, MD   150 mg at 02/10/17 1525  . dolutegravir (TIVICAY) tablet 50 mg  50 mg Oral BID Vaughan Basta, MD   50 mg at 02/10/17 1525  . emtricitabine-tenofovir AF (DESCOVY) 200-25 MG per tablet 1 tablet  1 tablet Oral Daily Vaughan Basta, MD   1 tablet at 02/10/17 1525  . enoxaparin (LOVENOX) injection 40 mg  40 mg Subcutaneous Q24H Salary, Montell D, MD   40 mg at 02/09/17 2354  . [START ON  02/11/2017] FLUoxetine (PROZAC) capsule 20 mg  20 mg Oral Daily Briceyda Abdullah, Dencil T, MD      . folic acid (FOLVITE) tablet 1 mg  1 mg Oral Daily Salary, Montell D, MD   1 mg at 02/10/17 0802  . HYDROcodone-acetaminophen (NORCO/VICODIN) 5-325 MG per tablet 1-2 tablet  1-2 tablet Oral Q4H PRN Salary, Avel Peace, MD  1 tablet at 02/10/17 1428  . LORazepam (ATIVAN) tablet 1 mg  1 mg Oral Q6H PRN Salary, Avel Peace, MD       Or  . LORazepam (ATIVAN) injection 1 mg  1 mg Intravenous Q6H PRN Salary, Montell D, MD      . multivitamin with minerals tablet 1 tablet  1 tablet Oral Daily Salary, Montell D, MD   1 tablet at 02/10/17 0802  . ondansetron (ZOFRAN) tablet 4 mg  4 mg Oral Q6H PRN Salary, Montell D, MD       Or  . ondansetron (ZOFRAN) injection 4 mg  4 mg Intravenous Q6H PRN Salary, Montell D, MD      . pantoprazole (PROTONIX) injection 40 mg  40 mg Intravenous Q24H Salary, Montell D, MD   40 mg at 02/09/17 2354  . polyethylene glycol (MIRALAX / GLYCOLAX) packet 17 g  17 g Oral Daily PRN Salary, Montell D, MD      . thiamine (VITAMIN B-1) tablet 100 mg  100 mg Oral Daily Salary, Montell D, MD   100 mg at 02/10/17 0802   Or  . thiamine (B-1) injection 100 mg  100 mg Intravenous Daily Salary, Montell D, MD        Musculoskeletal: Strength & Muscle Tone: within normal limits Gait & Station: normal Patient leans: N/A  Psychiatric Specialty Exam: Physical Exam  Nursing note and vitals reviewed. Constitutional: He appears well-developed and well-nourished.  HENT:  Head: Normocephalic and atraumatic.  Eyes: Conjunctivae are normal. Pupils are equal, round, and reactive to light.  Neck: Normal range of motion.  Cardiovascular: Regular rhythm and normal heart sounds.  Respiratory: Effort normal. No respiratory distress.  GI: Soft.  Musculoskeletal: Normal range of motion.  Neurological: He is alert.  Skin: Skin is warm and dry.  Psychiatric: His affect is blunt. His speech is delayed. He is  slowed and withdrawn. Thought content is not paranoid. Cognition and memory are normal. He expresses impulsivity. He exhibits a depressed mood. He expresses no homicidal and no suicidal ideation.    Review of Systems  Constitutional: Positive for weight loss.  HENT: Negative.   Eyes: Negative.   Respiratory: Negative.   Cardiovascular: Negative.   Gastrointestinal: Negative.   Musculoskeletal: Negative.   Skin: Negative.   Neurological: Positive for weakness.  Psychiatric/Behavioral: Positive for depression. Negative for hallucinations, memory loss, substance abuse and suicidal ideas. The patient is nervous/anxious and has insomnia.     Blood pressure (!) 172/84, pulse 90, temperature 98.8 F (37.1 C), temperature source Oral, resp. rate 18, height _0  (1.676 m), weight 55.6 kg (122 lb 8 oz), SpO2 98 %.Body mass index is 19.77 kg/m.  General Appearance: Disheveled  Eye Contact:  Good  Speech:  Clear and Coherent  Volume:  Decreased  Mood:  Depressed and Dysphoric  Affect:  Constricted  Thought Process:  Goal Directed  Orientation:  Full (Time, Place, and Person)  Thought Content:  Logical  Suicidal Thoughts:  No  Homicidal Thoughts:  No  Memory:  Immediate;   Good Recent;   Fair Remote;   Fair  Judgement:  Fair  Insight:  Fair  Psychomotor Activity:  Decreased  Concentration:  Concentration: Fair  Recall:  [employer] of Knowledge:  Fair  Language:  Fair  Akathisia:  No  Handed:  Right  AIMS (if indicated):     Assets:  Desire for Improvement Resilience Social Support  ADL's:  Impaired  Cognition:  WNL  Sleep:  Treatment Plan Summary: Medication management and Plan This is a 51 year old man who presents with multiple symptoms of depression including depressed mood, anhedonia, extremely decreased social activity and mental activity, hopelessness.  Not receiving any outpatient mental health treatment.  Denies suicidal intent but admits that he took an overdose  just to try to make himself sleep more.  Spent time educating the patient and his husband about depression.  Suggested that we start antidepressant medicine for now although made it clear that he would need follow-up.  Also strongly encouraged him to start seeing therapist and mental health providers.  I have put in an order to start fluoxetine 20 mg a day.  Prescription can be provided when he is discharged.  I have also ordered a social work consult so that they can give him information about outpatient mental health treatment in the community.  Disposition: Patient does not meet criteria for psychiatric inpatient admission. Supportive therapy provided about ongoing stressors.  Alethia Berthold, MD 02/10/2017 6:24 PM

## 2017-02-10 NOTE — Consult Note (Signed)
Reason for Consult:AMS Referring Physician: Elisabeth Pigeon  CC: Confusion, ataxia  HPI: Roy Trujillo is an 51 y.o. male with a known history of alcoholism-sober for 2-3 months, chronic marijuana abuse/dependency, presented to the emergency room with 2-3-week history of intermittent erratic behavior as well as depression.  Patient reports that he is very depressed and has been taking more of his medications that prescribed to try "numb" the depressed feeling.  Over the last 2-3 weeks has had periods of alternating mental status, ataxia.  Patient reports that he is at baseline at this time.     Past Medical History:  Diagnosis Date  . Avascular necrosis (HCC)   . Depression   . GERD (gastroesophageal reflux disease)   . HIV (human immunodeficiency virus infection) (HCC)     Past Surgical History:  Procedure Laterality Date  . CHOLECYSTECTOMY  2013  . HERNIA REPAIR  1974   double inguinal  . JOINT REPLACEMENT Left    left hip  . TONSILLECTOMY     as a child  . TOTAL HIP ARTHROPLASTY Right 05/15/2016   Procedure: TOTAL HIP ARTHROPLASTY;  Surgeon: Donato Heinz, MD;  Location: [facility] ORS;  Service: Orthopedics;  Laterality: Right;    Family history: Father deceased from a stroke.  Mother deceased from breast cancer.  Sister with breast cancer  Social History:  reports that  has never smoked. he has never used smokeless tobacco. He reports that he does not drink alcohol or use drugs.  Allergies  Allergen Reactions  . Penicillins Other (See Comments)    Unknown childhood reaction Has patient had a PCN reaction causing immediate rash, facial/tongue/throat swelling, SOB or lightheadedness with hypotension: Unknown Has patient had a PCN reaction causing severe rash involving mucus membranes or skin necrosis: Unknown Has patient had a PCN reaction that required hospitalization: Unknown Has patient had a PCN reaction occurring within the last 10 years: Unknown If all of the above answers  are "NO", then may proceed with Cephalosporin use.   . Bactrim [Sulfamethoxazole-Trimethoprim] Rash  . Zerit [Stavudine] Rash    Medications:  I have reviewed the patient's current medications. Prior to Admission:  Medications Prior to Admission  Medication Sig Dispense Refill Last Dose  . dolutegravir (TIVICAY) 50 MG tablet Take 1 tablet by mouth 2 (two) times daily.   02/09/2017 at Unknown time  . promethazine (PHENERGAN) 25 MG tablet Take 1 tablet (25 mg total) by mouth every 4 (four) hours as needed for nausea or vomiting. 30 tablet 1 Past Week at Unknown time  . traMADol (ULTRAM) 50 MG tablet Take 1-2 tablets (50-100 mg total) by mouth every 4 (four) hours as needed for moderate pain. (Patient taking differently: Take 100 mg by mouth at bedtime. ) 60 tablet 0 02/09/2017 at Unknown time  . DESCOVY 200-25 MG tablet Take 1 tablet by mouth daily.  1 02/08/2017   Scheduled: . clindamycin  150 mg Oral Q8H  . enoxaparin (LOVENOX) injection  40 mg Subcutaneous Q24H  . folic acid  1 mg Oral Daily  . multivitamin with minerals  1 tablet Oral Daily  . pantoprazole (PROTONIX) IV  40 mg Intravenous Q24H  . thiamine  100 mg Oral Daily   Or  . thiamine  100 mg Intravenous Daily    ROS: History obtained from the patient  General ROS: negative for - chills, fatigue, fever, night sweats, weight gain or weight loss Psychological ROS: depression Ophthalmic ROS: negative for - blurry vision, double vision, eye pain  or loss of vision ENT ROS: negative for - epistaxis, nasal discharge, oral lesions, sore throat, tinnitus or vertigo Allergy and Immunology ROS: negative for - hives or itchy/watery eyes Hematological and Lymphatic ROS: negative for - bleeding problems, bruising or swollen lymph nodes Endocrine ROS: negative for - galactorrhea, hair pattern changes, polydipsia/polyuria or temperature intolerance Respiratory ROS: negative for - cough, hemoptysis, shortness of breath or  wheezing Cardiovascular ROS: negative for - chest pain, dyspnea on exertion, edema or irregular heartbeat Gastrointestinal ROS: negative for - abdominal pain, diarrhea, hematemesis, nausea/vomiting or stool incontinence Genito-Urinary ROS: negative for - dysuria, hematuria, incontinence or urinary frequency/urgency Musculoskeletal ROS: negative for - joint swelling or muscular weakness Neurological ROS: as noted in HPI Dermatological ROS: negative for rash and skin lesion changes  Physical Examination: Blood pressure (!) 172/84, pulse 90, temperature 98.8 F (37.1 C), temperature source Oral, resp. rate 18, height 5\' 6"  (1.676 m), weight 55.6 kg (122 lb 8 oz), SpO2 98 %.  HEENT-  Normocephalic, no lesions, without obvious abnormality.  Normal external eye and conjunctiva.  Normal TM's bilaterally.  Normal auditory canals and external ears. Normal external nose, mucus membranes and septum.  Normal pharynx. Cardiovascular- S1, S2 normal, pulses palpable throughout   Lungs- chest clear, no wheezing, rales, normal symmetric air entry Abdomen- soft, non-tender; bowel sounds normal; no masses,  no organomegaly Extremities- no edema Lymph-no adenopathy palpable Musculoskeletal-no joint tenderness, deformity or swelling Skin-warm and dry, no hyperpigmentation, vitiligo, or suspicious lesions  Neurological Examination   Mental Status: Alert, oriented, thought content appropriate.  Speech fluent without evidence of aphasia.  Able to follow 3 step commands without difficulty. Cranial Nerves: II: Discs flat bilaterally; Visual fields grossly normal, pupils equal, round, reactive to light and accommodation III,IV, VI: ptosis not present, extra-ocular motions intact bilaterally V,VII: smile symmetric, facial light touch sensation normal bilaterally VIII: hearing normal bilaterally IX,X: gag reflex present XI: bilateral shoulder shrug XII: midline tongue extension Motor: Right : Upper extremity    5/5    Left:     Upper extremity   5/5  Lower extremity   5/5     Lower extremity   5/5 Tone and bulk:normal tone throughout; no atrophy noted Sensory: Pinprick and light touch intact throughout, bilaterally Deep Tendon Reflexes: 2+ and symmetric throughout Plantars: Right: downgoing   Left: downgoing Cerebellar: Normal finger-to-nose, normal rapid alternating movements and normal heel-to-shin test Gait: normal gait and station    Laboratory Studies:   Basic Metabolic Panel: Recent Labs  Lab 02/09/17 1859  NA 143  K 3.7  CL 106  CO2 27  GLUCOSE 126*  BUN 8  CREATININE 0.78  CALCIUM 8.9    Liver Function Tests: Recent Labs  Lab 02/09/17 1859  AST 35  ALT 42  ALKPHOS 74  BILITOT 0.9  PROT 7.2  ALBUMIN 4.5   Recent Labs  Lab 02/09/17 1902  LIPASE 20   Recent Labs  Lab 02/09/17 1914 02/09/17 2341  AMMONIA 27 14    CBC: Recent Labs  Lab 02/09/17 1859  WBC 5.6  HGB 15.2  HCT 44.9  MCV 96.9  PLT 116*    Cardiac Enzymes: Recent Labs  Lab 02/09/17 1902 02/09/17 2341 02/10/17 0503 02/10/17 1121  TROPONINI <0.03 <0.03 <0.03 <0.03    BNP: Invalid input(s): POCBNP  CBG: No results for input(s): GLUCAP in the last 168 hours.  Microbiology: Results for orders placed or performed during the hospital encounter of 05/01/16  Surgical pcr screen  Status: Abnormal   Collection Time: 05/01/16 12:59 PM  Result Value Ref Range Status   MRSA, PCR NEGATIVE NEGATIVE Final   Staphylococcus aureus POSITIVE (A) NEGATIVE Final    Comment:        The Xpert SA Assay (FDA approved for NASAL specimens in patients over 68 years of age), is one component of a comprehensive surveillance program.  Test performance has been validated by [facility] for patients greater than or equal to 58 year old. It is not intended to diagnose infection nor to guide or monitor treatment.   Urine culture     Status: None   Collection Time: 05/01/16 12:59 PM  Result Value  Ref Range Status   Specimen Description URINE, RANDOM  Final   Special Requests Normal  Final   Culture   Final    NO GROWTH Performed at [facility], [address] [place], [place] [address]    Report Status 05/02/2016 FINAL  Final    Coagulation Studies: No results for input(s): LABPROT, INR in the last 72 hours.  Urinalysis: No results for input(s): COLORURINE, LABSPEC, PHURINE, GLUCOSEU, HGBUR, BILIRUBINUR, KETONESUR, PROTEINUR, UROBILINOGEN, NITRITE, LEUKOCYTESUR in the last 168 hours.  Invalid input(s): APPERANCEUR  Lipid Panel:  No results found for: CHOL, TRIG, HDL, CHOLHDL, VLDL, LDLCALC  HgbA1C: No results found for: HGBA1C  Urine Drug Screen:      Component Value Date/Time   LABOPIA NONE DETECTED 02/09/2017 1942   COCAINSCRNUR NONE DETECTED 02/09/2017 1942   LABBENZ NONE DETECTED 02/09/2017 1942   AMPHETMU NONE DETECTED 02/09/2017 1942   THCU POSITIVE (A) 02/09/2017 1942   LABBARB NONE DETECTED 02/09/2017 1942    Alcohol Level:  Recent Labs  Lab 02/09/17 1902  ETH <10    Other results: EKG: sinus rhythm at 65 bpm.  Imaging: Dg Chest 1 View  Result Date: 02/09/2017 CLINICAL DATA:  Altered mental status EXAM: CHEST 1 VIEW COMPARISON:  November 26, 2016 FINDINGS: There is no edema or consolidation. The heart size and pulmonary vascularity are normal. No adenopathy. No bone lesions. IMPRESSION: No edema or consolidation. Electronically Signed   By: Bretta Bang III M.D.   On: 02/09/2017 19:51   Ct Head Wo Contrast  Result Date: 02/09/2017 CLINICAL DATA:  Altered mental status. RIGHT ear laceration. History of HIV, alcohol abuse. EXAM: CT HEAD WITHOUT CONTRAST TECHNIQUE: Contiguous axial images were obtained from the base of the skull through the vertex without intravenous contrast. COMPARISON:  None. FINDINGS: BRAIN: No intraparenchymal hemorrhage, mass effect nor midline shift. The ventricles and sulci are normal. No acute large vascular  territory infarcts. No abnormal extra-axial fluid collections. Basal cisterns are patent. VASCULAR: Trace calcific atherosclerosis carotid siphon. SKULL/SOFT TISSUES: No skull fracture. Minimal RIGHT temporal scalp soft tissue swelling without subcutaneous gas or radiopaque foreign bodies. ORBITS/SINUSES: The included ocular globes and orbital contents are normal.Trace paranasal sinus mucosal thickening without air-fluid levels. Included mastoid air cells are well aerated. OTHER: None. IMPRESSION: 1. Minimal RIGHT temporal scalp soft tissue swelling. 2. Otherwise negative noncontrast CT HEAD. Electronically Signed   By: Awilda Metro M.D.   On: 02/09/2017 19:49   Mr Brain Wo Contrast  Result Date: 02/10/2017 CLINICAL DATA:  51 year old male with HIV, recent altered mental status and erratic behavior. EXAM: MRI HEAD WITHOUT CONTRAST TECHNIQUE: Multiplanar, multiecho pulse sequences of the brain and surrounding structures were obtained without intravenous contrast. COMPARISON:  Head CT without contrast 02/09/2017. FINDINGS: Brain: Cerebral volume is within normal limits. No  restricted diffusion to suggest acute infarction. No midline shift, mass effect, evidence of mass lesion, ventriculomegaly, extra-axial collection or acute intracranial hemorrhage. Cervicomedullary junction and pituitary are within normal limits. Wallace Cullens and white matter signal is within normal limits throughout the brain. No encephalomalacia or chronic cerebral blood products identified. No contrast administered. Vascular: Major intracranial vascular flow voids are preserved. Skull and upper cervical spine: Negative visualized cervical spine. Normal bone marrow signal. Sinuses/Orbits: Negative orbits soft tissues. Mild ethmoid and maxillary sinus mucosal thickening, appears inconsequential. Other: Visible internal auditory structures appear normal. Mastoid air cells are clear. There is susceptibility artifact about the right pinna which  appears probably related to an earring seen on the CT scout view yesterday. No persistent right side scalp soft tissue swelling or abnormality is identified. Visible scalp and face soft tissues today appear negative. IMPRESSION: No acute intracranial abnormality. Normal noncontrast MRI appearance of the brain. Electronically Signed   By: Odessa Fleming M.D.   On: 02/10/2017 09:40     Assessment/Plan: 51 year old male presenting with AMS and ataxia.  Patient reports that he is at baseline at this time.  Symptoms likely related to ingestion which patient admits to.  MRI of the brain reviewed and shows no acute changes.  EEG unremarkable.  Neurological examination is nonfocal.    No further neurologic intervention is recommended at this time.  If further questions arise, please call or page at that time.  Thank you for allowing neurology to participate in the care of this patient.  Agree with psych evaluation.   Thana Farr, MD Neurology [phone] 02/10/2017, 2:35 PM

## 2017-02-11 LAB — HELPER T-LYMPH-CD4 (ARMC ONLY)
% CD 4 Pos. Lymph.: 12.1 % — ABNORMAL LOW (ref 30.8–58.5)
Absolute CD 4 Helper: 85 /uL — ABNORMAL LOW (ref 359–1519)
BASOS ABS: 0 10*3/uL (ref 0.0–0.2)
Basos: 0 %
EOS (ABSOLUTE): 0 10*3/uL (ref 0.0–0.4)
EOS: 1 %
HEMATOCRIT: 42.4 % (ref 37.5–51.0)
HEMOGLOBIN: 14.4 g/dL (ref 13.0–17.7)
IMMATURE GRANS (ABS): 0 10*3/uL (ref 0.0–0.1)
Immature Granulocytes: 0 %
LYMPHS ABS: 0.7 10*3/uL (ref 0.7–3.1)
LYMPHS: 9 %
MCH: 33.7 pg — ABNORMAL HIGH (ref 26.6–33.0)
MCHC: 34 g/dL (ref 31.5–35.7)
MCV: 99 fL — ABNORMAL HIGH (ref 79–97)
MONOCYTES: 6 %
Monocytes Absolute: 0.5 10*3/uL (ref 0.1–0.9)
NEUTROS ABS: 6.5 10*3/uL (ref 1.4–7.0)
Neutrophils: 84 %
PLATELETS: 119 10*3/uL — AB (ref 150–379)
RBC: 4.27 x10E6/uL (ref 4.14–5.80)
RDW: 13.9 % (ref 12.3–15.4)
WBC: 7.8 10*3/uL (ref 3.4–10.8)

## 2017-02-11 LAB — RPR: RPR Ser Ql: NONREACTIVE

## 2017-02-11 MED ORDER — FLUOXETINE HCL 20 MG PO CAPS: 20.0000 mg | ORAL_CAPSULE | Freq: Every day | ORAL | 3 refills | Status: DC

## 2017-02-11 MED ORDER — PANTOPRAZOLE SODIUM 40 MG PO TBEC: 40.0000 mg | DELAYED_RELEASE_TABLET | Freq: Every day | ORAL | Status: DC

## 2017-02-11 NOTE — Discharge Summary (Signed)
[facility] - [place] at [facility]   PATIENT NAME: Roy Trujillo    MR#:  [id number]  DATE OF BIRTH:  [birth year]  DATE OF ADMISSION:  02/09/2017 ADMITTING PHYSICIAN: Roy Sol, MD  DATE OF DISCHARGE: 02/11/2017  PRIMARY CARE PHYSICIAN: Roy Sell, MD    ADMISSION DIAGNOSIS:  Altered mental status, unspecified altered mental status type [R41.82]  DISCHARGE DIAGNOSIS:  Principal Problem:   Severe recurrent major depression without psychotic features (HCC) Active Problems:   Encephalopathy acute   Altered mental status   Overdose of sedative or hypnotic   SECONDARY DIAGNOSIS:   Past Medical History:  Diagnosis Date  . Avascular necrosis (HCC)   . Depression   . GERD (gastroesophageal reflux disease)   . HIV (human immunodeficiency virus infection) [facility])     HOSPITAL COURSE:   1acute encephalopathy Suspect related to medication side effect-patient taking increasing doses of his HIV medication to get a high, on tramadol, noted history of alcoholism, and chronic marijuana abuse  neurochecks per routine, aspiration/fall precautions, consult neurology for expert opinion, negative MRI of the brain,  His ammonia is not high.  After 24 hrs- completely alert and oriented, resume HIV meds.  His husband confirms him having depression and doing overdose on some of his meds.  Called psych eval. Psych started on fluoxetine. Given prescriptions.  2chronic HIV infection Resume HIV meds.  3history of alcoholism Sober for approximately 2-3 months per significant other  on alcohol withdrawal protocol, but no signs of withdrawal  4chronic GERD without esophagitis PPI daily    DISCHARGE CONDITIONS:   Stable.  CONSULTS OBTAINED:  Treatment Team:  Roy Farr, MD Roy Trujillo, Roy Denmark, MD Roy Murders, MD  DRUG ALLERGIES:   Allergies  Allergen Reactions  . Penicillins Other (See Comments)    Unknown childhood  reaction Has patient had a PCN reaction causing immediate rash, facial/tongue/throat swelling, SOB or lightheadedness with hypotension: Unknown Has patient had a PCN reaction causing severe rash involving mucus membranes or skin necrosis: Unknown Has patient had a PCN reaction that required hospitalization: Unknown Has patient had a PCN reaction occurring within the last 10 years: Unknown If all of the above answers are "NO", then may proceed with Cephalosporin use.   . Bactrim [Sulfamethoxazole-Trimethoprim] Rash  . Zerit [Stavudine] Rash    DISCHARGE MEDICATIONS:   Allergies as of 02/11/2017      Reactions   Penicillins Other (See Comments)   Unknown childhood reaction Has patient had a PCN reaction causing immediate rash, facial/tongue/throat swelling, SOB or lightheadedness with hypotension: Unknown Has patient had a PCN reaction causing severe rash involving mucus membranes or skin necrosis: Unknown Has patient had a PCN reaction that required hospitalization: Unknown Has patient had a PCN reaction occurring within the last 10 years: Unknown If all of the above answers are "NO", then may proceed with Cephalosporin use.   Bactrim [sulfamethoxazole-trimethoprim] Rash   Zerit [stavudine] Rash      Medication List    TAKE these medications   DESCOVY 200-25 MG tablet Generic drug:  emtricitabine-tenofovir AF Take 1 tablet by mouth daily.   FLUoxetine 20 MG capsule Commonly known as:  PROZAC Take 1 capsule (20 mg total) by mouth daily. Start taking on:  02/12/2017   promethazine 25 MG tablet Commonly known as:  PHENERGAN Take 1 tablet (25 mg total) by mouth every 4 (four) hours as needed for nausea or vomiting.   TIVICAY 50 MG tablet Generic drug:  dolutegravir Take  1 tablet by mouth 2 (two) times daily.   traMADol 50 MG tablet Commonly known as:  ULTRAM Take 1-2 tablets (50-100 mg total) by mouth every 4 (four) hours as needed for moderate pain. What changed:    how  much to take  when to take this        DISCHARGE INSTRUCTIONS:    Follow with PMD in 1-2 weeks. Follow with Psych clinic in 2 weeks.  If you experience worsening of your admission symptoms, develop shortness of breath, life threatening emergency, suicidal or homicidal thoughts you must seek medical attention immediately by calling 911 or calling your MD immediately  if symptoms less severe.  You Must read complete instructions/literature along with all the possible adverse reactions/side effects for all the Medicines you take and that have been prescribed to you. Take any new Medicines after you have completely understood and accept all the possible adverse reactions/side effects.   Please note  You were cared for by a hospitalist during your hospital stay. If you have any questions about your discharge medications or the care you received while you were in the hospital after you are discharged, you can call the unit and asked to speak with the hospitalist on call if the hospitalist that took care of you is not available. Once you are discharged, your primary care physician will handle any further medical issues. Please note that NO REFILLS for any discharge medications will be authorized once you are discharged, as it is imperative that you return to your primary care physician (or establish a relationship with a primary care physician if you do not have one) for your aftercare needs so that they can reassess your need for medications and monitor your lab values.    Today   CHIEF COMPLAINT:   Chief Complaint  Patient presents with  . Altered Mental Status    HISTORY OF PRESENT ILLNESS:  Roy Trujillo  is a 51 y.o. male with a known history of alcoholism-sober for 2-3 months, chronic marijuana abuse/dependency, presented to the emergency room with 2-3-week history of intermittent erratic behavior as well as depression, patient is a poor historian due to altered mental status,  information is coming from his husband whom is a good historian, patient over the last 2-3 weeks has had periods of alternating mental status, ataxia, has been taking more of his HIV medications intermittently-states that this gives him a high, has also had periods of weakness during this time, in the emergency room workup noted for marijuana urine drug screen, chest x-ray was negative, CT head unimpressive, patient evaluated in the emergency room, patient able to answer simple questions, following commands intermittently, is a poor historian, the patient's speech is normal, intermittently is able to tell us what he wants, he denies pain, they deny any infectious symptomatology, patient had fallen earlier this morning resulted in right ear laceration, patient is now been admitted for acute encephalopathy suspected related to medication side effect.   VITAL SIGNS:  Blood pressure (!) 154/77, pulse 91, temperature 98.2 F (36.8 C), temperature source Oral, resp. rate 18, height 5\' 6"  (1.676 m), weight 55.6 kg (122 lb 8 oz), SpO2 99 %.  I/O:    Intake/Output Summary (Last 24 hours) at 02/11/2017 1031 Last data filed at 02/11/2017 0900 Gross per 24 hour  Intake 1575 ml  Output -  Net 1575 ml    PHYSICAL EXAMINATION:   GENERAL:  51 y.o.-year-old patient lying in the bed with no acute distress.  EYES: Pupils equal, round, reactive to light and accommodation. No scleral icterus. Extraocular muscles intact.  HEENT: Head atraumatic, normocephalic. Oropharynx and nasopharynx clear. Right ear have laceration. NECK:  Supple, no jugular venous distention. No thyroid enlargement, no tenderness.  LUNGS: Normal breath sounds bilaterally, no wheezing, rales,rhonchi or crepitation. No use of accessory muscles of respiration.  CARDIOVASCULAR: S1, S2 normal. No murmurs, rubs, or gallops.  ABDOMEN: Soft, nontender, nondistended. Bowel sounds present. No organomegaly or mass.  EXTREMITIES: No pedal edema,  cyanosis, or clubbing.  NEUROLOGIC: Cranial nerves II through XII are intact. Muscle strength 5/5 in all extremities. Sensation intact. Gait not checked.  PSYCHIATRIC: The patient is alert and oriented x 3.  SKIN: No obvious rash, lesion, or ulcer.     DATA REVIEW:   CBC Recent Labs  Lab 02/09/17 1859  WBC 5.6  HGB 15.2  HCT 44.9  PLT 116*    Chemistries  Recent Labs  Lab 02/09/17 1859  NA 143  K 3.7  CL 106  CO2 27  GLUCOSE 126*  BUN 8  CREATININE 0.78  CALCIUM 8.9  AST 35  ALT 42  ALKPHOS 74  BILITOT 0.9    Cardiac Enzymes Recent Labs  Lab 02/10/17 1121  TROPONINI <0.03    Microbiology Results  Results for orders placed or performed during the hospital encounter of 05/01/16  Surgical pcr screen     Status: Abnormal   Collection Time: 05/01/16 12:59 PM  Result Value Ref Range Status   MRSA, PCR NEGATIVE NEGATIVE Final   Staphylococcus aureus POSITIVE (A) NEGATIVE Final    Comment:        The Xpert SA Assay (FDA approved for NASAL specimens in patients over 37 years of age), is one component of a comprehensive surveillance program.  Test performance has been validated by [facility] for patients greater than or equal to 87 year old. It is not intended to diagnose infection nor to guide or monitor treatment.   Urine culture     Status: None   Collection Time: 05/01/16 12:59 PM  Result Value Ref Range Status   Specimen Description URINE, RANDOM  Final   Special Requests Normal  Final   Culture   Final    NO GROWTH Performed at [facility], [address], [place], [place] [address]    Report Status 05/02/2016 FINAL  Final    RADIOLOGY:  Dg Chest 1 View  Result Date: 02/09/2017 CLINICAL DATA:  Altered mental status EXAM: CHEST 1 VIEW COMPARISON:  November 26, 2016 FINDINGS: There is no edema or consolidation. The heart size and pulmonary vascularity are normal. No adenopathy. No bone lesions. IMPRESSION: No edema or consolidation.  Electronically Signed   By: Bretta Bang III M.D.   On: 02/09/2017 19:51   Ct Head Wo Contrast  Result Date: 02/09/2017 CLINICAL DATA:  Altered mental status. RIGHT ear laceration. History of HIV, alcohol abuse. EXAM: CT HEAD WITHOUT CONTRAST TECHNIQUE: Contiguous axial images were obtained from the base of the skull through the vertex without intravenous contrast. COMPARISON:  None. FINDINGS: BRAIN: No intraparenchymal hemorrhage, mass effect nor midline shift. The ventricles and sulci are normal. No acute large vascular territory infarcts. No abnormal extra-axial fluid collections. Basal cisterns are patent. VASCULAR: Trace calcific atherosclerosis carotid siphon. SKULL/SOFT TISSUES: No skull fracture. Minimal RIGHT temporal scalp soft tissue swelling without subcutaneous gas or radiopaque foreign bodies. ORBITS/SINUSES: The included ocular globes and orbital contents are normal.Trace paranasal sinus mucosal thickening without air-fluid  levels. Included mastoid air cells are well aerated. OTHER: None. IMPRESSION: 1. Minimal RIGHT temporal scalp soft tissue swelling. 2. Otherwise negative noncontrast CT HEAD. Electronically Signed   By: Awilda Metro M.D.   On: 02/09/2017 19:49   Mr Brain Wo Contrast  Result Date: 02/10/2017 CLINICAL DATA:  51 year old male with HIV, recent altered mental status and erratic behavior. EXAM: MRI HEAD WITHOUT CONTRAST TECHNIQUE: Multiplanar, multiecho pulse sequences of the brain and surrounding structures were obtained without intravenous contrast. COMPARISON:  Head CT without contrast 02/09/2017. FINDINGS: Brain: Cerebral volume is within normal limits. No restricted diffusion to suggest acute infarction. No midline shift, mass effect, evidence of mass lesion, ventriculomegaly, extra-axial collection or acute intracranial hemorrhage. Cervicomedullary junction and pituitary are within normal limits. Wallace Cullens and white matter signal is within normal limits throughout the  brain. No encephalomalacia or chronic cerebral blood products identified. No contrast administered. Vascular: Major intracranial vascular flow voids are preserved. Skull and upper cervical spine: Negative visualized cervical spine. Normal bone marrow signal. Sinuses/Orbits: Negative orbits soft tissues. Mild ethmoid and maxillary sinus mucosal thickening, appears inconsequential. Other: Visible internal auditory structures appear normal. Mastoid air cells are clear. There is susceptibility artifact about the right pinna which appears probably related to an earring seen on the CT scout view yesterday. No persistent right side scalp soft tissue swelling or abnormality is identified. Visible scalp and face soft tissues today appear negative. IMPRESSION: No acute intracranial abnormality. Normal noncontrast MRI appearance of the brain. Electronically Signed   By: Odessa Fleming M.D.   On: 02/10/2017 09:40    EKG:   Orders placed or performed during the hospital encounter of 02/09/17  . EKG 12-Lead  . EKG 12-Lead      Management plans discussed with the patient, family and they are in agreement.  CODE STATUS:     Code Status Orders  (From admission, onward)        Start     Ordered   02/09/17 2315  Full code  Continuous     02/09/17 2314    Code Status History    Date Active Date Inactive Code Status Order ID Comments User Context   05/15/2016 13:23 05/17/2016 14:28 Full Code [id number]  Donato Heinz, MD Inpatient      TOTAL TIME TAKING CARE OF THIS PATIENT: 35 minutes.    [name] M.D on 02/11/2017 at 10:31 AM  Between 7am to 6pm - Pager - [phone]  After 6pm go to [email or web address] - password [email or web address]  [facility]  Office  [phone]  CC: Primary care physician; Roy Sell, MD   Note: This dictation was prepared with Dragon dictation along with smaller phrase technology. Any transcriptional errors that result from this process are  unintentional.

## 2017-02-11 NOTE — Care Management Important Message (Signed)
Important Message  Patient Details  Name: Roy Trujillo MRN: [id number] Date of Birth: 11-20-1965   Medicare Important Message Given:  Yes    Gwenette Greet, RN 02/11/2017, 7:10 AM

## 2017-02-11 NOTE — Progress Notes (Signed)
PHARMACIST - PHYSICIAN COMMUNICATION  DR:   Elisabeth Pigeon  CONCERNING: IV to Oral Route Change Policy  RECOMMENDATION: This patient is receiving pantoprazole by the intravenous route.  Based on criteria approved by the Pharmacy and Therapeutics Committee, the intravenous medication(s) is/are being converted to the equivalent oral dose form(s).   DESCRIPTION: These criteria include:  The patient is eating (either orally or via tube) and/or has been taking other orally administered medications for a least 24 hours  The patient has no evidence of active gastrointestinal bleeding or impaired GI absorption (gastrectomy, short bowel, patient on TNA or NPO).  If you have questions about this conversion, please contact the Pharmacy Department   Cindi Carbon, [facility] 02/11/2017 8:50 AM

## 2017-02-11 NOTE — Clinical Social Work Note (Addendum)
CSW received consult that patient wanted mental health resources and substance abuse resources, CSW provided list of resources in [place].  Patient was appreciative of resources provided, CSW to sign off, please reconsult if other social work needs arise.  Roy Trujillo. Roy Trujillo, MSW, Theresia Majors [phone]  02/11/2017 11:51 AM

## 2017-02-11 NOTE — Discharge Instructions (Signed)
Follow with psych clinic in 2-3 weeks.

## 2017-02-11 NOTE — Progress Notes (Signed)
Discharge paperwork reviewed with patient who verbalized understanding of new medication, follow up appointment, and resources for pysch counseling. Patient verbalized understanding. Patient's husband to transport home.

## 2017-03-27 ENCOUNTER — Emergency Department
Admission: EM | Admit: 2017-03-27 | Discharge: 2017-03-28 | Disposition: A | Discharge status: Home / Self Care | Payer: Medicare Other | Attending: Emergency Medicine | Admitting: Emergency Medicine

## 2017-03-27 ENCOUNTER — Emergency Department: Payer: Medicare Other

## 2017-03-27 DIAGNOSIS — F332 Major depressive disorder, recurrent severe without psychotic features: Secondary | ICD-10-CM | POA: Diagnosis not present

## 2017-03-27 DIAGNOSIS — B20 Human immunodeficiency virus [HIV] disease: Secondary | ICD-10-CM | POA: Insufficient documentation

## 2017-03-27 DIAGNOSIS — F419 Anxiety disorder, unspecified: Secondary | ICD-10-CM | POA: Diagnosis not present

## 2017-03-27 DIAGNOSIS — Z880 Allergy status to penicillin: Secondary | ICD-10-CM | POA: Diagnosis not present

## 2017-03-27 DIAGNOSIS — F39 Unspecified mood [affective] disorder: Secondary | ICD-10-CM | POA: Insufficient documentation

## 2017-03-27 DIAGNOSIS — Z79899 Other long term (current) drug therapy: Secondary | ICD-10-CM | POA: Diagnosis not present

## 2017-03-27 DIAGNOSIS — R4182 Altered mental status, unspecified: Secondary | ICD-10-CM | POA: Diagnosis present

## 2017-03-27 LAB — COMPREHENSIVE METABOLIC PANEL
ALBUMIN: 4.7 g/dL (ref 3.5–5.0)
ALK PHOS: 67 U/L (ref 38–126)
ALT: 78 U/L — AB (ref 17–63)
AST: 96 U/L — AB (ref 15–41)
Anion gap: 14 (ref 5–15)
BILIRUBIN TOTAL: 1.7 mg/dL — AB (ref 0.3–1.2)
BUN: 6 mg/dL (ref 6–20)
CALCIUM: 9.1 mg/dL (ref 8.9–10.3)
CO2: 25 mmol/L (ref 22–32)
CREATININE: 0.72 mg/dL (ref 0.61–1.24)
Chloride: 108 mmol/L (ref 101–111)
GFR calc Af Amer: >60 mL/min (ref >60)
GFR calc non Af Amer: >60 mL/min (ref >60)
Glucose, Bld: 124 mg/dL — ABNORMAL HIGH (ref 65–99)
Potassium: 3.5 mmol/L (ref 3.5–5.1)
SODIUM: 147 mmol/L — AB (ref 135–145)
TOTAL PROTEIN: 7.3 g/dL (ref 6.5–8.1)

## 2017-03-27 LAB — CBC
HCT: 42.2 % (ref 40.0–52.0)
Hemoglobin: 14.3 g/dL (ref 13.0–18.0)
MCH: 33.2 pg (ref 26.0–34.0)
MCHC: 34 g/dL (ref 32.0–36.0)
MCV: 97.7 fL (ref 80.0–100.0)
Platelets: 106 10*3/uL — ABNORMAL LOW (ref 150–440)
RBC: 4.32 MIL/uL — ABNORMAL LOW (ref 4.40–5.90)
RDW: 14.3 % (ref 11.5–14.5)
WBC: 5.7 10*3/uL (ref 3.8–10.6)

## 2017-03-27 LAB — LIPASE, BLOOD: LIPASE: 76 U/L — AB (ref 11–51)

## 2017-03-27 LAB — ETHANOL

## 2017-03-27 LAB — AMMONIA: AMMONIA: 30 umol/L (ref 9–35)

## 2017-03-27 LAB — ACETAMINOPHEN LEVEL: Acetaminophen (Tylenol), Serum: <10 ug/mL — ABNORMAL LOW (ref 10–30)

## 2017-03-27 LAB — SALICYLATE LEVEL: Salicylate Lvl: <7 mg/dL (ref 2.8–30.0)

## 2017-03-27 MED ORDER — LORAZEPAM 2 MG/ML IJ SOLN
1.0000 mg | Freq: Once | INTRAMUSCULAR | Status: AC
  Administered 2017-03-27: 1 mg via INTRAVENOUS
  Filled 2017-03-27: qty 1

## 2017-03-27 MED ORDER — LORAZEPAM 2 MG/ML IJ SOLN: 1.0000 mg | Freq: Once | INTRAMUSCULAR | Status: DC

## 2017-03-27 MED ORDER — SODIUM CHLORIDE 0.9 % IV BOLUS (SEPSIS)
1000.0000 mL | Freq: Once | INTRAVENOUS | Status: AC
  Administered 2017-03-27: 1000 mL via INTRAVENOUS

## 2017-03-27 NOTE — ED Provider Notes (Addendum)
[facility] Emergency Department Provider Note   ____________________________________________   First MD Initiated Contact with Patient 03/27/17 0302     (approximate)  I have reviewed the triage vital signs and the nursing notes.   HISTORY  Chief Complaint Altered Mental Status  Level 5 caveat: History limited by altered mental state  History obtained by wife  HPI Roy Trujillo is a 52 y.o. male brought to the ED from home by his husband with a chief complaint of altered mentation and mood swings.  Patient with a history of HIV who was a heavy drinker and stopped drinking 5 months ago.  To compensate for his cravings, he was intentionally overdosing on his HIV medicines to get high.  He was hospitalized in December 2018 with encephalopathy secondary to overdose.  For the past 2 months, his husband has kept his medicines locked away.  Has been noticing erratic mood swings alternating between joyful, laughing and sobbing/crying.  His PCP has referred him to a psychiatrist but patient has not seen one yet.  Husband states patient will routinely "lock up".  Shows me a video of the patient reportedly "locking up" for 5 hours, muscles tense, eyes open, crying hysterically.  Husband states patient awoke tonight from sleep yelling, crying, tensing his muscles throughout his body.  Patient states he has chronic diarrhea due to his HIV medications.  Denies recent fever, chills, chest pain, cough, shortness of breath, abdominal pain, nausea, vomiting, dysuria.  Denies recent travel or trauma.  Past Medical History:  Diagnosis Date  . Avascular necrosis (HCC)   . Depression   . GERD (gastroesophageal reflux disease)   . HIV (human immunodeficiency virus infection) [facility])     Patient Active Problem List   Diagnosis Date Noted  . Severe recurrent major depression without psychotic features (HCC) 02/10/2017  . 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 Altered  mental status   . Encephalopathy acute 02/09/2017  . Alcohol abuse 05/15/2016  . Depression 05/15/2016  . 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 Avascular necrosis of bone of hip, right (HCC) 03/31/2016  . HIV infection (HCC) 03/04/1996    Past Surgical History:  Procedure Laterality Date  . CHOLECYSTECTOMY  2013  . HERNIA REPAIR  1974   double inguinal  . JOINT REPLACEMENT Left    left hip  . TONSILLECTOMY     as a child  . TOTAL HIP ARTHROPLASTY Right 05/15/2016   Procedure: TOTAL HIP ARTHROPLASTY;  Surgeon: Donato Heinz, MD;  Location: [facility] ORS;  Service: Orthopedics;  Laterality: Right;    Prior to Admission medications   Medication Sig Start Date End Date Taking? Authorizing Provider  DESCOVY 200-25 MG tablet Take 1 tablet by mouth daily. 10/01/16   [provider]  dolutegravir (TIVICAY) 50 MG tablet Take 1 tablet by mouth 2 (two) times daily. 03/27/15   [provider]  FLUoxetine (PROZAC) 20 MG capsule Take 1 capsule (20 mg total) by mouth daily. 02/12/17   [name], MD  promethazine (PHENERGAN) 25 MG tablet Take 1 tablet (25 mg total) by mouth every 4 (four) hours as needed for nausea or vomiting. 04/19/15   Emily Filbert, MD  traMADol (ULTRAM) 50 MG tablet Take 1-2 tablets (50-100 mg total) by mouth every 4 (four) hours as needed for moderate pain. Patient taking differently: Take 100 mg by mouth at bedtime.  05/17/16   Tera Partridge, PA    Allergies Penicillins; Bactrim [sulfamethoxazole-trimethoprim]; and  Zerit [stavudine]  No family history on file.  Social History Social History   Tobacco Use  . Smoking status: Never Smoker  . Smokeless tobacco: Never Used  Substance Use Topics  . Alcohol use: No    Comment: no alcohol for several months  . Drug use: No    Review of Systems  Constitutional: No fever/chills. Eyes: No visual changes. ENT: No sore throat. Cardiovascular: Denies chest pain. Respiratory: Denies  shortness of breath. Gastrointestinal: No abdominal pain.  No nausea, no vomiting.  Positive for chronic diarrhea.  No constipation. Genitourinary: Negative for dysuria. Musculoskeletal: Negative for back pain. Skin: Negative for rash. Neurological: Negative for headaches, focal weakness or numbness. Psychiatric:Positive for labile mood swings.  Denies SI/HI/AH/VH.   ____________________________________________   PHYSICAL EXAM:  VITAL SIGNS: ED Triage Vitals  Enc Vitals Group     BP 03/27/17 0239 (!) 142/98     Pulse Rate 03/27/17 0239 84     Resp --      Temp 03/27/17 0237 98.2 F (36.8 C)     Temp Source 03/27/17 0237 Oral     SpO2 03/27/17 0239 96 %     Weight 03/27/17 0237 122 lb (55.3 kg)     Height --      Head Circumference --      Peak Flow --      Pain Score --      Pain Loc --      Pain Edu? --      Excl. in GC? --     Constitutional: Alert and oriented.  Disheveled, thin appearing and in mild acute distress.  Tearful and tensed up. Eyes: Conjunctivae are normal. PERRL. EOMI. Head: Atraumatic. Nose: No congestion/rhinnorhea. Mouth/Throat: Mucous membranes are moist.  Oropharynx non-erythematous. Neck: No stridor.  Supple neck without meningismus. Cardiovascular: Normal rate, regular rhythm. Grossly normal heart sounds.  Good peripheral circulation. Respiratory: Normal respiratory effort.  No retractions. Lungs CTAB. Gastrointestinal: Soft and nontender. No distention. No abdominal bruits. No CVA tenderness. Musculoskeletal: Arms and legs tensed and contracted.  This appears voluntary as patient relaxes while distracted.  No lower extremity tenderness nor edema.  No joint effusions. Neurologic: Alert and oriented x3.  CN II-XII grossly intact.  Normal speech and language. No gross focal neurologic deficits are appreciated.  Skin:  Skin is warm, dry and intact. No rash noted.  No petechiae. Psychiatric: Mood and affect are tearful. Speech and behavior are  normal.  ____________________________________________   LABS (all labs ordered are listed, but only abnormal results are displayed)  Labs Reviewed  COMPREHENSIVE METABOLIC PANEL - Abnormal; Notable for the following components:      Result Value   Sodium 147 (*)    Glucose, Bld 124 (*)    AST 96 (*)    ALT 78 (*)    Total Bilirubin 1.7 (*)    All other components within normal limits  CBC - Abnormal; Notable for the following components:   RBC 4.32 (*)    Platelets 106 (*)    All other components within normal limits  ACETAMINOPHEN LEVEL - Abnormal; Notable for the following components:   Acetaminophen (Tylenol), Serum <10 (*)    All other components within normal limits  LIPASE, BLOOD - Abnormal; Notable for the following components:   Lipase 76 (*)    All other components within normal limits  ETHANOL  AMMONIA  SALICYLATE LEVEL  URINE DRUG SCREEN, QUALITATIVE ([facility] ONLY)  URINALYSIS, COMPLETE (UACMP) WITH MICROSCOPIC  CBG  MONITORING, ED   ____________________________________________  EKG  ED ECG REPORT I, Sharnette Kitamura J, the attending physician, personally viewed and interpreted this ECG.    Date: 03/27/2017  EKG Time: 0400  Rate: 86  Rhythm: normal EKG, normal sinus rhythm  Axis: Normal  Intervals:none  ST&T Change: Nonspecific  ____________________________________________  RADIOLOGY  Ct Head Wo Contrast  Result Date: 03/27/2017 CLINICAL DATA:  Altered level of consciousness. EXAM: CT HEAD WITHOUT CONTRAST TECHNIQUE: Contiguous axial images were obtained from the base of the skull through the vertex without intravenous contrast. COMPARISON:  CT head 02/09/2017.  MRI brain 02/10/2017. FINDINGS: Brain: No evidence of acute infarction, hemorrhage, hydrocephalus, extra-axial collection or mass lesion/mass effect. Vascular: No hyperdense vessel or unexpected calcification. Skull: Normal. Negative for fracture or focal lesion. Sinuses/Orbits: No acute finding. Other:  None. IMPRESSION: No acute intracranial abnormalities. Electronically Signed   By: Burman Nieves M.D.   On: 03/27/2017 03:53   Dg Chest Port 1 View  Result Date: 03/27/2017 CLINICAL DATA:  Body aches, weakness, altered mental status. EXAM: PORTABLE CHEST 1 VIEW COMPARISON:  02/09/2017 FINDINGS: Pulmonary hyperinflation. The heart size and mediastinal contours are within normal limits. Both lungs are clear. The visualized skeletal structures are unremarkable. IMPRESSION: No active disease. Electronically Signed   By: Burman Nieves M.D.   On: 03/27/2017 03:37    ____________________________________________   PROCEDURES  Procedure(s) performed: None  Procedures  Critical Care performed: No  ____________________________________________   INITIAL IMPRESSION / ASSESSMENT AND PLAN / ED COURSE  As part of my medical decision making, I reviewed the following data within the electronic MEDICAL RECORD NUMBER History obtained from family, Nursing notes reviewed and incorporated, Labs reviewed, EKG interpreted, Old chart reviewed, Radiograph reviewed, A consult was requested and obtained from this/these consultant(s) Psychiatry and Notes from prior ED visits.   52 year old male with HIV, history of alcoholism, history of overdose who presents with altered mentation and crying spell.  Differential diagnosis includes but is not limited to neurologic, infectious, metabolic etiologies, etc.  Discussed with patient and spouse we will do an organic workup in the emergency department.  If unremarkable, will hold patient in the ED for psychiatry consult in the morning.  IV Ativan given for patient hysterically crying.  Clinical Course as of Mar 27 706  Thu Mar 27, 2017  1552 Patient resting in no acute distress.  More calm after administration of IV Ativan.  Updated patient and spouse of laboratory and imaging results.  Will hold patient in the ED overnight for TTS and psychiatry evaluations.  [JS]    [id number] Patient starting to get upset again, does not wish to stay.  Husband at bedside.  Agreeable to stay for psychiatric evaluation.  Will re-dose Ativan.  Patient will remain voluntary pending psychiatric evaluation.  Care transferred to Dr. Fanny Bien.  [JS]  0707 Patient desires to leave.  Denies active SI/HI/AH/VH.  Does not wish to stay for psychiatric evaluation even with another dose of IV Ativan.  Spouse is at bedside who is unable to convince patient to stay.  Spouse will take patient home to follow-up at [facility].  Strict return precautions given.  Both verbalize understanding and agree with plan of care.  [JS]    Clinical Course User Index [JS] Irean Hong, MD     ____________________________________________   FINAL CLINICAL IMPRESSION(S) / ED DIAGNOSES  Final diagnoses:  Anxiety  Mood disorder [facility])     ED Discharge Orders    None  Note:  This document was prepared using Dragon voice recognition software and may include unintentional dictation errors.    Irean Hong, MD 03/27/17 3474    Irean Hong, MD 03/27/17 [phone]

## 2017-03-27 NOTE — ED Triage Notes (Signed)
Patient arrived to this ED with spouse. Patient's husband reports patient awoke from sleep yelling, with muscles tensed throughout the body. Patient crying in triage. Patient unable to follow instructions in triage. Patient rocking back and forth.

## 2017-03-27 NOTE — BH Assessment (Signed)
Assessment Note  Roy Trujillo is an 52 y.o. male who presents to the ED via POV brought in by husband of 6 years (together 58yrs). Pt reports that when he get really nervous or anxious about a situation his body tenses up and he can't move. He reports that when this happens it is extremely painful and he isn't sure how long the episode will last. Pt was tearful during the assessment asking for a referral for local counseling services. Pt stated "I just really need someone to talk to. I'm just going through so much.   Pt admits to having a substance  Abuse problem but says that he quit drinking 6 months ago. His husband reports that once the pt stopped drinking he turned to medication abuse. The husband reports that he went out and bought a safe for his medication and took the pt's debit card to prevent him from buying over the counter medications. The husband was extremely forthcoming and answered most of the questions on the assessment before the pt had a chance to answer.They both admit to smoking marijuana on a regular basis and says that it helps to calm the pt down when he get anxious. Pt reports that he still hasn't gotten over the death of his dog who died of old age just 1 year ago this past Christmas. Pt became tearful while talking about deceased pet.   Pt denies SI/HI A/V H  Diagnosis: Depression  Past Medical History:  Past Medical History:  Diagnosis Date  . Avascular necrosis (HCC)   . Depression   . GERD (gastroesophageal reflux disease)   . HIV (human immunodeficiency virus infection) (HCC)     Past Surgical History:  Procedure Laterality Date  . CHOLECYSTECTOMY  2013  . HERNIA REPAIR  1974   double inguinal  . JOINT REPLACEMENT Left    left hip  . TONSILLECTOMY     as a child  . TOTAL HIP ARTHROPLASTY Right 05/15/2016   Procedure: TOTAL HIP ARTHROPLASTY;  Surgeon: Donato Heinz, MD;  Location: [facility] ORS;  Service: Orthopedics;  Laterality: Right;    Family  History: No family history on file.  Social History:  reports that  has never smoked. he has never used smokeless tobacco. He reports that he does not drink alcohol or use drugs.  Additional Social History:  Alcohol / Drug Use Pain Medications: See MAR Prescriptions: See MAR Over the Counter: See MAR History of alcohol / drug use?: Yes Substance #1 Name of Substance 1: Alcohol 1 - Age of First Use: 13 1 - Last Use / Amount: 6 mo ago Substance #2 Name of Substance 2: Marijuana 2 - Frequency: weekly  CIWA: CIWA-Ar BP: (!) 136/103 Pulse Rate: 63 COWS:    Allergies:  Allergies  Allergen Reactions  . Penicillins Other (See Comments)    Unknown childhood reaction Has patient had a PCN reaction causing immediate rash, facial/tongue/throat swelling, SOB or lightheadedness with hypotension: Unknown Has patient had a PCN reaction causing severe rash involving mucus membranes or skin necrosis: Unknown Has patient had a PCN reaction that required hospitalization: Unknown Has patient had a PCN reaction occurring within the last 10 years: Unknown If all of the above answers are "NO", then may proceed with Cephalosporin use.   . Bactrim [Sulfamethoxazole-Trimethoprim] Rash  . Zerit [Stavudine] Rash    Home Medications:  (Not in a hospital admission)  OB/GYN Status:  No LMP for male patient.  General Assessment Data Location of Assessment: [facility]  ED TTS Assessment: In system Is this a Tele or Face-to-Face Assessment?: Face-to-Face Is this an Initial Assessment or a Re-assessment for this encounter?: Initial Assessment Marital status: Married Powers name: Harle Stanford Is patient pregnant?: No Pregnancy Status: No Living Arrangements: Spouse/significant other Can pt return to current living arrangement?: Yes Admission Status: Voluntary Is patient capable of signing voluntary admission?: Yes Referral Source: Self/Family/Friend Insurance type: Medicare  Medical Screening Exam [facility]  Walk-in ONLY) Medical Exam completed: No Reason for MSE not completed: Other:  Crisis Care Plan Living Arrangements: Spouse/significant other Legal Guardian: Other:(Self)  Education Status Is patient currently in school?: No Highest grade of school patient has completed: some college  Risk to self with the past 6 months Suicidal Ideation: No Has patient been a risk to self within the past 6 months prior to admission? : No Suicidal Intent: No Has patient had any suicidal intent within the past 6 months prior to admission? : No Is patient at risk for suicide?: No Suicidal Plan?: No Has patient had any suicidal plan within the past 6 months prior to admission? : No Access to Means: No What has been your use of drugs/alcohol within the last 12 months?: Pt admits marijuana use  Previous Attempts/Gestures: No How many times?: 0 Other Self Harm Risks: n/a Triggers for Past Attempts: None known Intentional Self Injurious Behavior: None Family Suicide History: No Recent stressful life event(s): Loss (Comment), Other (Comment) Persecutory voices/beliefs?: No Depression: Yes Depression Symptoms: Insomnia, Tearfulness, Fatigue, Feeling angry/irritable Substance abuse history and/or treatment for substance abuse?: Yes Suicide prevention information given to non-admitted patients: Not applicable  Risk to Others within the past 6 months Homicidal Ideation: No Does patient have any lifetime risk of violence toward others beyond the six months prior to admission? : No Thoughts of Harm to Others: No Current Homicidal Intent: No Identified Victim: n/a History of harm to others?: No Assessment of Violence: None Noted Violent Behavior Description: none noted Does patient have access to weapons?: No Criminal Charges Pending?: No Does patient have a court date: No Is patient on probation?: No  Psychosis Hallucinations: None noted Delusions: None noted  Mental Status  Report Appearance/Hygiene: Disheveled, In scrubs Eye Contact: Good Motor Activity: Rigidity Speech: Soft, Slow, Logical/coherent Level of Consciousness: Alert Mood: Depressed, Anxious, Despair, Sad Affect: Depressed Anxiety Level: Moderate Thought Processes: Coherent, Relevant Judgement: Partial Orientation: Person, Place, Time, Situation, Appropriate for developmental age Obsessive Compulsive Thoughts/Behaviors: None  Cognitive Functioning Concentration: Normal Memory: Recent Intact, Remote Intact IQ: Average Insight: Good Impulse Control: Fair Appetite: Fair Weight Loss: 0 Weight Gain: 0 Sleep: Decreased Total Hours of Sleep: 4 Vegetative Symptoms: None     Prior Inpatient Therapy Prior Inpatient Therapy: No  Prior Outpatient Therapy Prior Outpatient Therapy: No Does patient have an ACCT team?: No Does patient have Intensive In-House Services?  : No Does patient have [facility] services? : No Does patient have P4CC services?: No  ADL Screening (condition at time of admission) Is the patient deaf or have difficulty hearing?: No Does the patient have difficulty seeing, even when wearing glasses/contacts?: No Does the patient have difficulty concentrating, remembering, or making decisions?: No Does the patient have difficulty dressing or bathing?: No Does the patient have difficulty walking or climbing stairs?: No Weakness of Legs: Both Weakness of Arms/Hands: None  Home Assistive Devices/Equipment Home Assistive Devices/Equipment: None  Therapy Consults (therapy consults require a physician order) PT Evaluation Needed: No OT Evalulation Needed: No SLP Evaluation Needed: No Abuse/Neglect Assessment (Assessment to  be complete while patient is alone) Abuse/Neglect Assessment Can Be Completed: Yes Physical Abuse: Denies Verbal Abuse: Denies Sexual Abuse: Denies Exploitation of patient/patient's resources: Denies Self-Neglect: Denies Values / Beliefs Cultural  Requests During Hospitalization: None Spiritual Requests During Hospitalization: None Consults Spiritual Care Consult Needed: No Social Work Consult Needed: No Merchant navy officer (For Healthcare) Does Patient Have a Medical Advance Directive?: No Would patient like information on creating a medical advance directive?: No - Patient declined    Additional Information 1:1 In Past 12 Months?: No CIRT Risk: No Elopement Risk: No Does patient have medical clearance?: No  Child/Adolescent Assessment Running Away Risk: Denies Bed-Wetting: Denies Destruction of Property: Denies Cruelty to Animals: Denies Stealing: Denies Rebellious/Defies Authority: Denies Satanic Involvement: Denies Archivist: Denies Problems at [employer]: Denies Gang Involvement: Denies  Disposition:  Disposition Initial Assessment Completed for this Encounter: Yes Disposition of Patient: Pending Review with psychiatrist  On Site Evaluation by:   Reviewed with Physician:    Clarann Helvey D Orla Jolliff 03/27/2017 6:52 AM

## 2017-03-27 NOTE — ED Notes (Signed)
Pt changed out of hospital clothing and into burgundy scrubs. All belongings given to spouse. Pt still has IV in right FA. Pt informed on the process and plan of care. Pt provides verbal understanding.

## 2017-03-27 NOTE — Discharge Instructions (Signed)
Please make an appointment to speak with a psychiatrist or therapist about your mood.  Return to the ER for worsening symptoms, feelings of hurting yourself or others, or other concerns.

## 2017-03-28 NOTE — ED Notes (Signed)
Pt DC'd att from OTF for purposes of admin clean up

## 2017-04-19 ENCOUNTER — Other Ambulatory Visit: Payer: Self-pay

## 2017-04-19 ENCOUNTER — Emergency Department: Payer: Medicare Other

## 2017-04-19 ENCOUNTER — Encounter: Payer: Self-pay | Admitting: Emergency Medicine

## 2017-04-19 DIAGNOSIS — M25551 Pain in right hip: Secondary | ICD-10-CM | POA: Insufficient documentation

## 2017-04-19 DIAGNOSIS — M25552 Pain in left hip: Secondary | ICD-10-CM | POA: Diagnosis not present

## 2017-04-19 DIAGNOSIS — Z5321 Procedure and treatment not carried out due to patient leaving prior to being seen by health care provider: Secondary | ICD-10-CM | POA: Diagnosis not present

## 2017-04-19 DIAGNOSIS — G9340 Encephalopathy, unspecified: Secondary | ICD-10-CM | POA: Diagnosis not present

## 2017-04-19 DIAGNOSIS — R4182 Altered mental status, unspecified: Secondary | ICD-10-CM | POA: Insufficient documentation

## 2017-04-19 DIAGNOSIS — F329 Major depressive disorder, single episode, unspecified: Secondary | ICD-10-CM | POA: Diagnosis not present

## 2017-04-19 NOTE — ED Triage Notes (Signed)
Pt arrives to ED with significant other for bilateral hip pain. Pt has hx of surgeries on both hips. Pt is in NAD at this time.

## 2017-04-20 ENCOUNTER — Other Ambulatory Visit: Payer: Self-pay

## 2017-04-20 ENCOUNTER — Emergency Department
Admission: EM | Admit: 2017-04-20 | Discharge: 2017-04-20 | Disposition: A | Discharge status: Home / Self Care | Payer: Medicare Other | Attending: Emergency Medicine | Admitting: Emergency Medicine

## 2017-04-20 ENCOUNTER — Emergency Department (EMERGENCY_DEPARTMENT_HOSPITAL)
Admission: EM | Admit: 2017-04-20 | Discharge: 2017-04-21 | Disposition: A | Discharge status: Home / Self Care | Payer: Medicare Other | Source: Home / Self Care | Attending: Emergency Medicine | Admitting: Emergency Medicine

## 2017-04-20 ENCOUNTER — Encounter: Payer: Self-pay | Admitting: Emergency Medicine

## 2017-04-20 DIAGNOSIS — Z96643 Presence of artificial hip joint, bilateral: Secondary | ICD-10-CM | POA: Insufficient documentation

## 2017-04-20 DIAGNOSIS — R4182 Altered mental status, unspecified: Secondary | ICD-10-CM

## 2017-04-20 DIAGNOSIS — M25551 Pain in right hip: Secondary | ICD-10-CM | POA: Insufficient documentation

## 2017-04-20 DIAGNOSIS — F341 Dysthymic disorder: Secondary | ICD-10-CM

## 2017-04-20 DIAGNOSIS — F419 Anxiety disorder, unspecified: Secondary | ICD-10-CM | POA: Insufficient documentation

## 2017-04-20 DIAGNOSIS — Z79899 Other long term (current) drug therapy: Secondary | ICD-10-CM | POA: Insufficient documentation

## 2017-04-20 DIAGNOSIS — M25552 Pain in left hip: Principal | ICD-10-CM

## 2017-04-20 DIAGNOSIS — R451 Restlessness and agitation: Secondary | ICD-10-CM | POA: Insufficient documentation

## 2017-04-20 DIAGNOSIS — G9340 Encephalopathy, unspecified: Secondary | ICD-10-CM | POA: Diagnosis present

## 2017-04-20 NOTE — ED Notes (Signed)
Pt not in room at this time. Registration called and stated pt was leaving stating, "I can't wait any longer" after being placed in room 10 mins prior. Dr Dolores Frame made aware and pt to be disposed LWBS.

## 2017-04-20 NOTE — ED Notes (Signed)
Pt noted to be ambulating in the WR to bathroom with no difficulty

## 2017-04-20 NOTE — ED Triage Notes (Addendum)
Pt presents with husband; husband says pt has been falling at [facility] home ; pt c/o bilateral hip pain; pt was here last night for same; waited in lobby for hours and then eloped from treatment room minutes after getting in to see the MD: pt was never seen by his primary nurse or the MD; husband says pt "freaked out" in the treatment room and left; in triage tonight pt is restless; cursing; crying; husband says pt has been like this all day; pt denies suicidal thoughts but says he would like to speak with psychiatrist; IV attempt by Penni Bombard RN unsuccessful as pt will not remain still;

## 2017-04-21 ENCOUNTER — Encounter: Payer: Self-pay | Admitting: Radiology

## 2017-04-21 ENCOUNTER — Emergency Department: Payer: Medicare Other

## 2017-04-21 DIAGNOSIS — G9340 Encephalopathy, unspecified: Secondary | ICD-10-CM

## 2017-04-21 DIAGNOSIS — F341 Dysthymic disorder: Secondary | ICD-10-CM

## 2017-04-21 LAB — COMPREHENSIVE METABOLIC PANEL
ALBUMIN: 5.3 g/dL — AB (ref 3.5–5.0)
ALK PHOS: 60 U/L (ref 38–126)
ALT: 46 U/L (ref 17–63)
AST: 53 U/L — AB (ref 15–41)
Anion gap: 15 (ref 5–15)
BUN: 8 mg/dL (ref 6–20)
CALCIUM: 9.4 mg/dL (ref 8.9–10.3)
CO2: 25 mmol/L (ref 22–32)
Chloride: 105 mmol/L (ref 101–111)
Creatinine, Ser: 0.78 mg/dL (ref 0.61–1.24)
GFR calc Af Amer: >60 mL/min (ref >60)
GFR calc non Af Amer: >60 mL/min (ref >60)
GLUCOSE: 116 mg/dL — AB (ref 65–99)
Potassium: 3.8 mmol/L (ref 3.5–5.1)
SODIUM: 145 mmol/L (ref 135–145)
Total Bilirubin: 1.6 mg/dL — ABNORMAL HIGH (ref 0.3–1.2)
Total Protein: 7.9 g/dL (ref 6.5–8.1)

## 2017-04-21 LAB — URINE DRUG SCREEN, QUALITATIVE (ARMC ONLY)
Amphetamines, Ur Screen: NOT DETECTED
BENZODIAZEPINE, UR SCRN: NOT DETECTED
Barbiturates, Ur Screen: NOT DETECTED
CANNABINOID 50 NG, UR ~~LOC~~: POSITIVE — AB
Cocaine Metabolite,Ur ~~LOC~~: NOT DETECTED
MDMA (Ecstasy)Ur Screen: NOT DETECTED
Methadone Scn, Ur: NOT DETECTED
Opiate, Ur Screen: NOT DETECTED
PHENCYCLIDINE (PCP) UR S: NOT DETECTED
Tricyclic, Ur Screen: NOT DETECTED

## 2017-04-21 LAB — URINALYSIS, COMPLETE (UACMP) WITH MICROSCOPIC
BACTERIA UA: NONE SEEN
GLUCOSE, UA: NEGATIVE mg/dL
HGB URINE DIPSTICK: NEGATIVE
KETONES UR: 20 mg/dL — AB
Leukocytes, UA: NEGATIVE
Nitrite: NEGATIVE
PROTEIN: 100 mg/dL — AB
Specific Gravity, Urine: 1.028 (ref 1.005–1.030)
pH: 6 (ref 5.0–8.0)

## 2017-04-21 LAB — CBC
HEMATOCRIT: 44.8 % (ref 40.0–52.0)
HEMOGLOBIN: 15.1 g/dL (ref 13.0–18.0)
MCH: 33.1 pg (ref 26.0–34.0)
MCHC: 33.8 g/dL (ref 32.0–36.0)
MCV: 97.9 fL (ref 80.0–100.0)
Platelets: 131 10*3/uL — ABNORMAL LOW (ref 150–440)
RBC: 4.58 MIL/uL (ref 4.40–5.90)
RDW: 14.1 % (ref 11.5–14.5)
WBC: 11.8 10*3/uL — AB (ref 3.8–10.6)

## 2017-04-21 LAB — ETHANOL: Alcohol, Ethyl (B): <10 mg/dL (ref <10)

## 2017-04-21 LAB — AMMONIA: Ammonia: 33 umol/L (ref 9–35)

## 2017-04-21 LAB — TROPONIN I: Troponin I: <0.03 ng/mL (ref <0.03)

## 2017-04-21 MED ORDER — PROMETHAZINE HCL 25 MG/ML IJ SOLN
12.5000 mg | Freq: Once | INTRAMUSCULAR | Status: AC
  Administered 2017-04-21: 12.5 mg via INTRAVENOUS

## 2017-04-21 MED ORDER — IOPAMIDOL (ISOVUE-300) INJECTION 61%
75.0000 mL | Freq: Once | INTRAVENOUS | Status: AC | PRN
  Administered 2017-04-21: 75 mL via INTRAVENOUS

## 2017-04-21 MED ORDER — LORAZEPAM 1 MG PO TABS: 1.0000 mg | ORAL_TABLET | Freq: Two times a day (BID) | ORAL | 0 refills | Status: DC

## 2017-04-21 MED ORDER — LORAZEPAM 2 MG/ML IJ SOLN
2.0000 mg | Freq: Once | INTRAMUSCULAR | Status: AC
  Administered 2017-04-21: 2 mg via INTRAVENOUS
  Filled 2017-04-21: qty 1

## 2017-04-21 MED ORDER — PROMETHAZINE HCL 25 MG/ML IJ SOLN
INTRAMUSCULAR | Status: AC
  Administered 2017-04-21: 12.5 mg via INTRAVENOUS
  Filled 2017-04-21: qty 1

## 2017-04-21 MED ORDER — ZIPRASIDONE MESYLATE 20 MG IM SOLR
20.0000 mg | Freq: Once | INTRAMUSCULAR | Status: AC
  Administered 2017-04-21: 20 mg via INTRAMUSCULAR
  Filled 2017-04-21: qty 20

## 2017-04-21 NOTE — BH Assessment (Signed)
Assessment Note  Roy Trujillo is an 52 y.o. male. Mr. Roy Trujillo arrived to the ED by personal transportation by his partner.  He reports that he was having problems with his problem.  He denied symptoms of depression.  Partner reports that he is homesick and he has lost some of his family members and he has not been the same. He was having difficulty sleeping.  About a week ago he started medication for sleeping, and he sleeps for short periods.  He does not identify changes in his appetite, but patient is a light eater. He reports symptoms of anxiety.  He reports that he has a lot of stomach distress.  He is pacing, and he is constantly worrying. He denied having auditory or visual hallucinations. He denied suicidal or homicidal ideation or intent. He denied having any new stressors.  He reports He denied the use of alcohol at this time.  He has not had alcohol in the last 5 months.  He has smoked marijuana in the last week.   Over the last few nights he has been having falls.  He states it is like a loss of consciousness. Partner reports that over the last few weeks, he has not been doing right.  Diagnosis: Depression  Past Medical History:  Past Medical History:  Diagnosis Date  . Avascular necrosis (HCC)   . Depression   . GERD (gastroesophageal reflux disease)   . HIV (human immunodeficiency virus infection) (HCC)     Past Surgical History:  Procedure Laterality Date  . CHOLECYSTECTOMY  2013  . HERNIA REPAIR  1974   double inguinal  . JOINT REPLACEMENT Left    left hip  . TONSILLECTOMY     as a child  . 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 No pertinent family history.  Social History:  reports that  has never smoked. he has never used smokeless tobacco. He reports that he does not drink alcohol or use  drugs.  Additional Social History:  Alcohol / Drug Use History of alcohol / drug use?: Yes Substance #1 Name of Substance 1: Alcohol 1 - Age of First Use: Unsure 1 - Last Use / Amount: 5 months ago Substance #2 Name of Substance 2: Marijauna 2 - Age of First Use: Unsure 2 - Frequency: daily 2 - Last Use / Amount: 04/15/2017  CIWA: CIWA-Ar BP: (!) 129/95 Pulse Rate: 78 COWS:    Allergies:  Allergies  Allergen Reactions  . Penicillins Other (See Comments)    Unknown childhood reaction Has patient had a PCN reaction causing immediate rash, facial/tongue/throat swelling, SOB or lightheadedness with hypotension: Unknown Has patient had a PCN reaction causing severe rash involving mucus membranes or skin necrosis: Unknown Has patient had a PCN reaction that required hospitalization: Unknown Has patient had a PCN reaction occurring within the last 10 years: Unknown If all of the above answers are "NO", then may proceed with Cephalosporin use.   . Bactrim [Sulfamethoxazole-Trimethoprim] Rash  . Zerit [Stavudine] Rash    Home Medications:  (Not in a hospital admission)  OB/GYN Status:  No LMP for male patient.  General Assessment Data Location of Assessment: [facility] ED TTS Assessment: In system Is this a Tele or Face-to-Face Assessment?: Face-to-Face Is this an Initial Assessment or a Re-assessment for this encounter?: Initial Assessment Marital status: Married [place]  name: Roy Trujillo Is patient pregnant?: No Pregnancy Status: No Living Arrangements: Spouse/significant other Can pt return to current living arrangement?: Yes Admission Status: Voluntary Is patient capable of signing voluntary admission?: Yes Referral Source: Self/Family/Friend Insurance type: Medicaid/Medicare  Medical Screening Exam [facility] Walk-in ONLY) Medical Exam completed: Yes  Crisis Care Plan Living Arrangements: Spouse/significant other Legal Guardian: Other:(Self) Name of Psychiatrist: None at this time  - appointment for next month at [facility] Name of Therapist: None at this time  Education Status Is patient currently in school?: No Current Grade: n/a Highest grade of school patient has completed: 12th Name of school: [employer] person: n/a  Risk to self with the past 6 months Suicidal Ideation: No Has patient been a risk to self within the past 6 months prior to admission? : No Suicidal Intent: No Has patient had any suicidal intent within the past 6 months prior to admission? : No Is patient at risk for suicide?: No Suicidal Plan?: No Has patient had any suicidal plan within the past 6 months prior to admission? : No Access to Means: No What has been your use of drugs/alcohol within the last 12 months?: daily use of marijauna Previous Attempts/Gestures: No How many times?: 0 Other Self Harm Risks: denied Triggers for Past Attempts: None known Intentional Self Injurious Behavior: None Family Suicide History: No Recent stressful life event(s): Other (Comment)(Health concerns) Persecutory voices/beliefs?: No Depression: Yes Depression Symptoms: Despondent Substance abuse history and/or treatment for substance abuse?: Yes Suicide prevention information given to non-admitted patients: Not applicable  Risk to Others within the past 6 months Homicidal Ideation: No Does patient have any lifetime risk of violence toward others beyond the six months prior to admission? : No Thoughts of Harm to Others: No Current Homicidal Intent: No Current Homicidal Plan: No Access to Homicidal Means: No Identified Victim: None identified History of harm to others?: No Assessment of Violence: None Noted Does patient have access to weapons?: No Criminal Charges Pending?: No Does patient have a court date: No Is patient on probation?: No  Psychosis Hallucinations: None noted Delusions: None noted  Mental Status Report Appearance/Hygiene:  Unremarkable Eye Contact: Fair Motor Activity: Unremarkable Speech: Soft, Logical/coherent Level of Consciousness: Alert Mood: Pleasant Affect: Appropriate to circumstance Anxiety Level: Moderate Thought Processes: Coherent Judgement: Partial Orientation: Appropriate for developmental age Obsessive Compulsive Thoughts/Behaviors: None  Cognitive Functioning Concentration: Poor Memory: Recent Intact IQ: Average Insight: Fair Impulse Control: Fair Appetite: Poor Sleep: Decreased Vegetative Symptoms: None  ADLScreening [facility]) Patient's cognitive ability adequate to safely complete daily activities?: Yes Patient able to express need for assistance with ADLs?: Yes Independently performs ADLs?: Yes (appropriate for developmental age)  Prior Inpatient Therapy Prior Inpatient Therapy: No Prior Therapy Dates: n/a Prior Therapy Facilty/Provider(s): n/a Reason for Treatment: n/a  Prior Outpatient Therapy Prior Outpatient Therapy: No Prior Therapy Dates: n/a Prior Therapy Facilty/Provider(s): n/a Reason for Treatment: n/a Does patient have an ACCT team?: No Does patient have Intensive In-House Services?  : No Does patient have [facility] services? : No Does patient have P4CC services?: No  ADL Screening (condition at time of admission) Patient's cognitive ability adequate to safely complete daily activities?: Yes Is the patient deaf or have difficulty hearing?: No Does the patient have difficulty seeing, even when wearing glasses/contacts?: No Does the patient have difficulty concentrating, remembering, or making decisions?: No Patient able to express need for assistance with ADLs?: Yes Does the patient have difficulty dressing or bathing?: No Independently  performs ADLs?: Yes (appropriate for developmental age) Does the patient have difficulty walking or climbing stairs?: No Weakness of Legs: Both(both hips replaced) Weakness of Arms/Hands: None  Home  Assistive Devices/Equipment Home Assistive Devices/Equipment: None    Abuse/Neglect Assessment (Assessment to be complete while patient is alone) Physical Abuse: Denies Verbal Abuse: Denies Sexual Abuse: Denies Exploitation of patient/patient's resources: Denies     Merchant navy officer (For Healthcare) Does Patient Have a Medical Advance Directive?: No Would patient like information on creating a medical advance directive?: No - Patient declined    Additional Information 1:1 In Past 12 Months?: No CIRT Risk: No Elopement Risk: No Does patient have medical clearance?: Yes     Disposition:  Disposition Initial Assessment Completed for this Encounter: Yes Disposition of Patient: Pending Review with psychiatrist  On Site Evaluation by:   Reviewed with Physician:    Justice Deeds 04/21/2017 4:36 AM

## 2017-04-21 NOTE — ED Notes (Signed)
Pt wanting to know the status of his plan of care. Rn informed pt that we are waiting on the psychiatry consult. Pt states he wishes to just see someone on his own and be discharged at this time. MD to be notified

## 2017-04-21 NOTE — ED Notes (Signed)
Pt informed that the psychiatrist would be coming to see him shortly. Pt agreeing to wait and speak with psychiatrist. Pt offered food and drink at this time and warm blanket

## 2017-04-21 NOTE — ED Notes (Signed)
Husband to pt is going home [phone], Roy Trujillo, would like to to speak to psychiatrist

## 2017-04-21 NOTE — Consult Note (Signed)
[facility] Consult   Reason for Consult: Consult for 52 year old man who has been in the emergency room for over a day after coming in initially with leg pain.  Concern about mood and anxiety Referring Physician: Jimmye Norman Patient Identification: Roy Trujillo MRN:  [id number] Principal Diagnosis: Encephalopathy acute Diagnosis:   Patient Active Problem List   Diagnosis Date Noted  . Dysthymia [F34.1] 04/21/2017  . Severe recurrent major depression without psychotic features ([place]) [F33.2] 02/10/2017  . Overdose of sedative or hypnotic [T42.71XA] 02/10/2017  . Altered mental status [R41.82]   . Encephalopathy acute [G93.40] 02/09/2017  . Alcohol abuse [F10.10] 05/15/2016  . Depression [F32.9] 05/15/2016  . S/P total hip arthroplasty [Z96.649] 05/15/2016  . Avascular necrosis of bone of hip, right ([place]) [M87.051] 03/31/2016  . HIV infection ([place]) [B20] 03/04/1996    Total Time spent with patient: 1 hour  Subjective:   Roy Trujillo is a 52 y.o. male patient admitted with "I just need to go home and go to sleep".  HPI: Patient interviewed chart reviewed.  Patient known to me from at least 1 previous encounter.  This 52 year old man came to the emergency room the night before last initially complaining of leg pain.  He was noted to have some odd behavior going back and forth about whether he wanted to be seen.  At one point tried to walk out after still not being seen by a doctor.  Then became more anxious and agitated and was at one point requesting to see the psychiatrist.  Leg pain problems seem to no longer be the acute issue.  I saw the patient this afternoon.  He tells me that he is not able to sleep well.  His chief complaint at this point is that he has been awake so long he cannot think clearly.  He has trouble sleeping pretty much every night.  Mood feels anxious and depressed as well.  Patient is not a very effective historian.  He gets confused very  easily.  Answers a lot of questions tangentially or with irrelevant information.  He did not however make any obviously psychotic or bizarre statements.  Denies any thoughts of self-harm or suicidality.  Denies any thoughts of harming anyone else.  Does not report acute psychotic symptoms.  Patient states that he takes trazodone to sleep prescribed by his infectious disease doctor.  He also talks about sometimes taking extra doses of Phenergan at night in an attempt to sleep.  He is not currently seeing a mental health provider of any sort.  He denies that he is abusing any alcohol or drugs.  Except for marijuana, which he claims he uses only once a month although his drug screen is once again positive.  Social history: Patient is in a same sex marriage lives with his husband.  Patient is disabled does not work outside the home.  Reportedly the 2 of them lived together and take care of the husband's disabled brother.  Husband was not present in the room at this time.  Medical history: Patient is HIV positive and reportedly has been for probably 20 years.  He is seen by Dr. Ola Spurr and his prescribed HIV medication.  The patient cannot remember what his last cell counts or viral loads were.  He does seem to be losing weight and looks run down.  Does not have other acute physical complaints however.  When he first came into the emergency room it sounded like he was complaining of joint  pain but now when he describes the leg pain it sounds like myalgias on the frontal aspects of his legs.  Substance abuse history: Uses marijuana.  Unclear how frequently.  Denies other alcohol or drug abuse.  Past Psychiatric History: Patient reports having been prescribed antidepressants in the past.  Fluoxetine caused itching.  He thinks he may have been on other medicines but cannot remember.  The only thing else I see documented in the chart as the trazodone.  Patient denies suicide attempts denies psychotic symptoms does  not report a history of bipolar disorder.  Risk to Self: Suicidal Ideation: No Suicidal Intent: No Is patient at risk for suicide?: No Suicidal Plan?: No Access to Means: No What has been your use of drugs/alcohol within the last 12 months?: daily use of marijauna How many times?: 0 Other Self Harm Risks: denied Triggers for Past Attempts: None known Intentional Self Injurious Behavior: None Risk to Others: Homicidal Ideation: No Thoughts of Harm to Others: No Current Homicidal Intent: No Current Homicidal Plan: No Access to Homicidal Means: No Identified Victim: None identified History of harm to others?: No Assessment of Violence: None Noted Does patient have access to weapons?: No Criminal Charges Pending?: No Does patient have a court date: No Prior Inpatient Therapy: Prior Inpatient Therapy: No Prior Therapy Dates: n/a Prior Therapy Facilty/Provider(s): n/a Reason for Treatment: n/a Prior Outpatient Therapy: Prior Outpatient Therapy: No Prior Therapy Dates: n/a Prior Therapy Facilty/Provider(s): n/a Reason for Treatment: n/a Does patient have an ACCT team?: No Does patient have Intensive In-House Services?  : No Does patient have [facility] services? : No Does patient have P4CC services?: No  Past Medical History:  Past Medical History:  Diagnosis Date  . Avascular necrosis ([place])   . Depression   . GERD (gastroesophageal reflux disease)   . HIV (human immunodeficiency virus infection) ([place])     Past Surgical History:  Procedure Laterality Date  . CHOLECYSTECTOMY  2013  . HERNIA REPAIR  1974   double inguinal  . JOINT REPLACEMENT Left    left hip  . TONSILLECTOMY     as a child  . TOTAL HIP ARTHROPLASTY Right 05/15/2016   Procedure: TOTAL HIP ARTHROPLASTY;  Surgeon: Dereck Leep, MD;  Location: [facility] ORS;  Service: Orthopedics;  Laterality: Right;   Family History: History reviewed. No pertinent family history. Family Psychiatric  History: No information  available currently Social History:  Social History   Substance and Sexual Activity  Alcohol Use No   Comment: no alcohol for several months     Social History   Substance and Sexual Activity  Drug Use No    Social History   Socioeconomic History  . Marital status: Married    Spouse name: None  . Number of children: None  . Years of education: None  . Highest education level: None  Social Needs  . Financial resource strain: None  . Food insecurity - worry: None  . Food insecurity - inability: None  . Transportation needs - medical: None  . Transportation needs - non-medical: None  Occupational History  . None  Tobacco Use  . Smoking status: Never Smoker  . Smokeless tobacco: Never Used  Substance and Sexual Activity  . Alcohol use: No    Comment: no alcohol for several months  . Drug use: No  . Sexual activity: Yes  Other Topics Concern  . None  Social History Narrative  . None   Additional Social History:    Allergies:  Allergies  Allergen Reactions  . Penicillins Other (See Comments)    Unknown childhood reaction Has patient had a PCN reaction causing immediate rash, facial/tongue/throat swelling, SOB or lightheadedness with hypotension: Unknown Has patient had a PCN reaction causing severe rash involving mucus membranes or skin necrosis: Unknown Has patient had a PCN reaction that required hospitalization: Unknown Has patient had a PCN reaction occurring within the last 10 years: Unknown If all of the above answers are "NO", then may proceed with Cephalosporin use.   . Bactrim [Sulfamethoxazole-Trimethoprim] Rash  . Zerit [Stavudine] Rash    Labs:  Results for orders placed or performed during the hospital encounter of 04/20/17 (from the past 48 hour(s))  CBC     Status: Abnormal   Collection Time: 04/21/17 12:37 AM  Result Value Ref Range   WBC 11.8 (H) 3.8 - 10.6 K/uL   RBC 4.58 4.40 - 5.90 MIL/uL   Hemoglobin 15.1 13.0 - 18.0 g/dL   HCT 44.8  40.0 - 52.0 %   MCV 97.9 80.0 - 100.0 fL   MCH 33.1 26.0 - 34.0 pg   MCHC 33.8 32.0 - 36.0 g/dL   RDW 14.1 11.5 - 14.5 %   Platelets 131 (L) 150 - 440 K/uL    Comment: Performed at [facility], [address] [place], [place] [address]  Comprehensive metabolic panel     Status: Abnormal   Collection Time: 04/21/17 12:37 AM  Result Value Ref Range   Sodium 145 135 - 145 mmol/L   Potassium 3.8 3.5 - 5.1 mmol/L   Chloride 105 101 - 111 mmol/L   CO2 25 22 - 32 mmol/L   Glucose, Bld 116 (H) 65 - 99 mg/dL   BUN 8 6 - 20 mg/dL   Creatinine, Ser 0.78 0.61 - 1.24 mg/dL   Calcium 9.4 8.9 - 10.3 mg/dL   Total Protein 7.9 6.5 - 8.1 g/dL   Albumin 5.3 (H) 3.5 - 5.0 g/dL   AST 53 (H) 15 - 41 U/L   ALT 46 17 - 63 U/L   Alkaline Phosphatase 60 38 - 126 U/L   Total Bilirubin 1.6 (H) 0.3 - 1.2 mg/dL   GFR calc non Af Amer >60 >60 mL/min   GFR calc Af Amer >60 >60 mL/min    Comment: (NOTE) The eGFR has been calculated using the CKD EPI equation. This calculation has not been validated in all clinical situations. eGFR's persistently <60 mL/min signify possible Chronic Kidney Disease.    Anion gap 15 5 - 15    Comment: Performed at [facility], [address] [place], [place] [address]  Troponin I     Status: None   Collection Time: 04/21/17 12:37 AM  Result Value Ref Range   Troponin I <0.03 <0.03 ng/mL    Comment: Performed at [facility], [address] [place], [place] [address]  Ethanol     Status: None   Collection Time: 04/21/17 12:37 AM  Result Value Ref Range   Alcohol, Ethyl (B) <10 <10 mg/dL    Comment:        LOWEST DETECTABLE LIMIT FOR SERUM ALCOHOL IS 10 mg/dL FOR MEDICAL PURPOSES ONLY Performed at [facility], [address] [place], [place] [address]   Urinalysis, Complete w Microscopic     Status: Abnormal   Collection Time: 04/21/17 12:37 AM  Result Value Ref Range   Color, Urine YELLOW (A) YELLOW   APPearance CLEAR (A)  CLEAR   Specific  Gravity, Urine 1.028 1.005 - 1.030   pH 6.0 5.0 - 8.0   Glucose, UA NEGATIVE NEGATIVE mg/dL   Hgb urine dipstick NEGATIVE NEGATIVE   Bilirubin Urine SMALL (A) NEGATIVE   Ketones, ur 20 (A) NEGATIVE mg/dL   Protein, ur 100 (A) NEGATIVE mg/dL   Nitrite NEGATIVE NEGATIVE   Leukocytes, UA NEGATIVE NEGATIVE   RBC / HPF 0-5 0 - 5 RBC/hpf   WBC, UA 0-5 0 - 5 WBC/hpf   Bacteria, UA NONE SEEN NONE SEEN   Squamous Epithelial / LPF 0-5 (A) NONE SEEN   Mucus PRESENT     Comment: Performed at [facility], [address], [place], [place] [address]  Urine Drug Screen, Qualitative ([facility] only)     Status: Abnormal   Collection Time: 04/21/17 12:37 AM  Result Value Ref Range   Tricyclic, Ur Screen NONE DETECTED NONE DETECTED   Amphetamines, Ur Screen NONE DETECTED NONE DETECTED   MDMA (Ecstasy)Ur Screen NONE DETECTED NONE DETECTED   Cocaine Metabolite,Ur [place] NONE DETECTED NONE DETECTED   Opiate, Ur Screen NONE DETECTED NONE DETECTED   Phencyclidine (PCP) Ur S NONE DETECTED NONE DETECTED   Cannabinoid 50 Ng, Ur [place] POSITIVE (A) NONE DETECTED   Barbiturates, Ur Screen NONE DETECTED NONE DETECTED   Benzodiazepine, Ur Scrn NONE DETECTED NONE DETECTED   Methadone Scn, Ur NONE DETECTED NONE DETECTED    Comment: (NOTE) Tricyclics + metabolites, urine    Cutoff 1000 ng/mL Amphetamines + metabolites, urine  Cutoff 1000 ng/mL MDMA (Ecstasy), urine              Cutoff 500 ng/mL Cocaine Metabolite, urine          Cutoff 300 ng/mL Opiate + metabolites, urine        Cutoff 300 ng/mL Phencyclidine (PCP), urine         Cutoff 25 ng/mL Cannabinoid, urine                 Cutoff 50 ng/mL Barbiturates + metabolites, urine  Cutoff 200 ng/mL Benzodiazepine, urine              Cutoff 200 ng/mL Methadone, urine                   Cutoff 300 ng/mL The urine drug screen provides only a preliminary, unconfirmed analytical test result and should not be used for non-medical purposes. Clinical  consideration and professional judgment should be applied to any positive drug screen result due to possible interfering substances. A more specific alternate chemical method must be used in order to obtain a confirmed analytical result. Gas chromatography / mass spectrometry (GC/MS) is the preferred confirmat ory method. Performed at [facility], [address] [place], [place] [address]   Ammonia     Status: None   Collection Time: 04/21/17 12:37 AM  Result Value Ref Range   Ammonia 33 9 - 35 umol/L    Comment: Performed at [facility], [address] [place], [place] [address]    No current facility-administered medications for this encounter.    Current Outpatient Medications  Medication Sig Dispense Refill  . DESCOVY 200-25 MG tablet Take 1 tablet by mouth daily.  1  . dolutegravir (TIVICAY) 50 MG tablet Take 1 tablet by mouth 2 (two) times daily.    . traMADol (ULTRAM) 50 MG tablet Take 1-2 tablets (50-100 mg total) by mouth every 4 (four) hours as needed for moderate pain. (Patient taking differently: Take 100  mg by mouth at bedtime. ) 60 tablet 0  . FLUoxetine (PROZAC) 20 MG capsule Take 1 capsule (20 mg total) by mouth daily. (Patient not taking: Reported on 04/21/2017) 30 capsule 3  . LORazepam (ATIVAN) 1 MG tablet Take 1 tablet (1 mg total) by mouth 2 (two) times daily. 12 tablet 0  . promethazine (PHENERGAN) 25 MG tablet Take 1 tablet (25 mg total) by mouth every 4 (four) hours as needed for nausea or vomiting. 30 tablet 1    Musculoskeletal: Strength & Muscle Tone: within normal limits Gait & Station: normal Patient leans: N/A  Psychiatric Specialty Exam: Physical Exam  Nursing note and vitals reviewed. Constitutional: He appears well-developed and well-nourished.  HENT:  Head: Normocephalic and atraumatic.  Eyes: Conjunctivae are normal. Pupils are equal, round, and reactive to light.  Neck: Normal range of motion.  Cardiovascular:  Regular rhythm and normal heart sounds.  Respiratory: Effort normal. No respiratory distress.  GI: Soft.  Musculoskeletal: Normal range of motion.  Neurological: He is alert.  Skin: Skin is warm and dry.  Psychiatric: His mood appears anxious. His speech is delayed. He is slowed and withdrawn. Cognition and memory are impaired. He expresses no homicidal and no suicidal ideation. He exhibits abnormal recent memory.    Review of Systems  Constitutional: Positive for malaise/fatigue and weight loss.  HENT: Negative.   Eyes: Negative.   Respiratory: Negative.   Cardiovascular: Negative.   Gastrointestinal: Negative.   Musculoskeletal: Positive for myalgias.  Skin: Negative.   Neurological: Positive for weakness.  Psychiatric/Behavioral: Positive for depression and memory loss. Negative for hallucinations, substance abuse and suicidal ideas. The patient is nervous/anxious and has insomnia.     Blood pressure (!) 137/95, pulse (!) 106, temperature 98.1 F (36.7 C), temperature source Oral, resp. rate 18, height '5\' 6"'$  (1.676 m), weight 54.4 kg (120 lb), SpO2 97 %.Body mass index is 19.37 kg/m.  General Appearance: Casual  Eye Contact:  Fair  Speech:  Slow  Volume:  Decreased  Mood:  Anxious  Affect:  Constricted  Thought Process:  Disorganized  Orientation:  Other:  Patient is basically oriented to the situation but he gets distracted very easily and often answers questions nonsensically and only sometimes catches himself.  Thought Content:  Illogical, Rumination and Tangential  Suicidal Thoughts:  No  Homicidal Thoughts:  No  Memory:  Immediate;   Fair Recent;   Poor Remote;   Fair  Judgement:  Impaired  Insight:  Shallow  Psychomotor Activity:  Decreased and Restlessness  Concentration:  Concentration: Poor  Recall:  Poor  Fund of Knowledge:  Poor  Language:  Fair  Akathisia:  No  Handed:  Right  AIMS (if indicated):     Assets:  Desire for Improvement Housing Social  Support  ADL's:  Impaired  Cognition:  Impaired,  Mild  Sleep:        Treatment Plan Summary: Plan 52 year old man with anxiety symptoms depression symptoms difficulty sleeping.  Patient right now is frustrated and is asking to be released so that he can go home.  There is no indication that he meets commitment criteria or what needs to be forced to stay in the hospital.  He seems to be quite confused and his conversation with me.  Very notably he would answer questions with irrelevant information or by repeating answers he had already given me to previous questions.  He seemed to be tired and confused and blunted and his mood.  However he did not  seem to be acutely dangerous to himself and he does have a safe place to stay.  Differential diagnosis includes worsening depression, dementia that could be related to his HIV disease, substance abuse greater than what is identified.  Case reviewed with emergency room physician.  I am not comfortable with starting any specific medication as I am not sure exactly what I would be targeting with this patient and there is also a real risk that he is going to miss use it.  Patient was strongly encouraged to follow up with an outpatient mental health provider as soon as possible and in the meantime consider taking his problems back to his primary care doctor or infectious disease physician for reassessment.  No indication however for further hospitalization.  Disposition: No evidence of imminent risk to self or others at present.   Patient does not meet criteria for psychiatric inpatient admission. Supportive therapy provided about ongoing stressors. Discussed crisis plan, support from social network, calling 911, coming to the Emergency Department, and calling Suicide Hotline.  Alethia Berthold, MD 04/21/2017 2:59 PM

## 2017-04-21 NOTE — ED Notes (Addendum)
Pt and this RN not able to get in touch with husband at this time.Cab to be called for pt to take him home. RN Fish farm manager aware

## 2017-04-21 NOTE — ED Provider Notes (Signed)
She was seen by Dr. Toni Amend and cleared for discharge.   Emily Filbert, MD 04/21/17 1425

## 2017-04-21 NOTE — ED Provider Notes (Signed)
[facility] Emergency Department Provider Note   ____________________________________________   First MD Initiated Contact with Patient 04/20/17 2357     (approximate)  I have reviewed the triage vital signs and the nursing notes.   HISTORY  Chief Complaint Altered Mental Status and Hip Pain    HPI Roy Trujillo is a 52 y.o. male who comes into the hospital today with some hip pain.  The patient is also been having some erratic behavior.  The patient has a history of bilateral hip replacements one in [place] and then 1 again a year ago.  The patient significant other states that his mental state has been in and out and getting worse.  They report that they could not do anything with him according to the police until the patient wanted to come in to be seen.  The patient's family states that they are concerned he is having a psychiatric episode.  The patient has been very emotional today.  He has vacillated between crying, pacing and screaming.  The patient has been very restless.  He messed up the house with feces and urine today.  He does not have any psych history but he wants to be seen by psych.  The patient's significant other states that his dog passed away a couple of years ago and he has had multiple family members passed away in the past 2 years which has been making his mental state worse and worse.  The patient does have a history of HIV and alcoholism.  The patient stopped drinking about 5 months ago.  The patient was seen about a month or 2 ago with altered mental status but he has been taking his medications in an effort to get high.  The patient is here today for evaluation.  The patient is not suicidal or homicidal.   Past Medical History:  Diagnosis Date  . Avascular necrosis (HCC)   . Depression   . GERD (gastroesophageal reflux disease)   . HIV (human immunodeficiency virus infection) [facility])     Patient Active Problem List   Diagnosis  Date Noted  . Severe recurrent major depression without psychotic features (HCC) 02/10/2017  . 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 Altered mental status   . Encephalopathy acute 02/09/2017  . Alcohol abuse 05/15/2016  . Depression 05/15/2016  . 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 Avascular necrosis of bone of hip, right (HCC) 03/31/2016  . HIV infection (HCC) 03/04/1996    Past Surgical History:  Procedure Laterality Date  . CHOLECYSTECTOMY  2013  . HERNIA REPAIR  1974   double inguinal  . JOINT REPLACEMENT Left    left hip  . TONSILLECTOMY     as a child  . TOTAL HIP ARTHROPLASTY Right 05/15/2016   Procedure: TOTAL HIP ARTHROPLASTY;  Surgeon: Donato Heinz, MD;  Location: [facility] ORS;  Service: Orthopedics;  Laterality: Right;    Prior to Admission medications   Medication Sig Start Date End Date Taking? Authorizing Provider  DESCOVY 200-25 MG tablet Take 1 tablet by mouth daily. 10/01/16   [provider]  dolutegravir (TIVICAY) 50 MG tablet Take 1 tablet by mouth 2 (two) times daily. 03/27/15   [provider]  FLUoxetine (PROZAC) 20 MG capsule Take 1 capsule (20 mg total) by mouth daily. 02/12/17   [name], MD  promethazine (PHENERGAN) 25 MG tablet Take 1 tablet (25 mg total) by mouth every 4 (four) hours as needed for nausea or  vomiting. 04/19/15   Emily Filbert, MD  traMADol (ULTRAM) 50 MG tablet Take 1-2 tablets (50-100 mg total) by mouth every 4 (four) hours as needed for moderate pain. Patient taking differently: Take 100 mg by mouth at bedtime.  05/17/16   Tera Partridge, PA    Allergies Penicillins; Bactrim [sulfamethoxazole-trimethoprim]; and Zerit [stavudine]  History reviewed. No pertinent family history.  Social History Social History   Tobacco Use  . Smoking status: Never Smoker  . Smokeless tobacco: Never Used  Substance Use Topics  . Alcohol use: No    Comment: no alcohol for several months  . Drug use:  No    Review of Systems  Constitutional: No fever/chills Eyes: No visual changes. ENT: No sore throat. Cardiovascular: Denies chest pain. Respiratory: Denies shortness of breath. Gastrointestinal: No abdominal pain.  No nausea, no vomiting.  No diarrhea.  No constipation. Genitourinary: Negative for dysuria. Musculoskeletal: Negative for back pain. Skin: Negative for rash. Neurological: Negative for headaches, focal weakness or numbness. Psych: Erratic behavior, restlessness and agitation, emotional lability  ____________________________________________   PHYSICAL EXAM:  VITAL SIGNS: ED Triage Vitals  Enc Vitals Group     BP 04/20/17 2313 (!) 129/95     Pulse Rate 04/20/17 2313 78     Resp 04/20/17 2313 20     Temp 04/20/17 2313 98.1 F (36.7 C)     Temp Source 04/20/17 2313 Oral     SpO2 04/20/17 2313 96 %     Weight 04/20/17 2311 120 lb (54.4 kg)     Height 04/20/17 2311 5\' 6"  (1.676 m)     Head Circumference --      Peak Flow --      Pain Score --      Pain Loc --      Pain Edu? --      Excl. in GC? --     Constitutional: Alert and oriented. Well appearing and in moderate distress. Eyes: Conjunctivae are normal. PERRL. EOMI. Head: Atraumatic. Nose: No congestion/rhinnorhea. Mouth/Throat: Mucous membranes are moist.  Oropharynx non-erythematous. Cardiovascular: Normal rate, regular rhythm. Grossly normal heart sounds.  Good peripheral circulation. Respiratory: Normal respiratory effort.  No retractions. Lungs CTAB. Gastrointestinal: Soft and nontender. No distention.  Positive bowel sounds Musculoskeletal: No lower extremity tenderness nor edema.   Neurologic:  Normal speech and language.  Skin:  Skin is warm, dry and intact.  Psychiatric: Patient walking around and pacing, murmuring to himself, tried to walk out of the room multiple times, not making eye contact, restless  ____________________________________________   LABS (all labs ordered are listed,  but only abnormal results are displayed)  Labs Reviewed  CBC - Abnormal; Notable for the following components:      Result Value   WBC 11.8 (*)    Platelets 131 (*)    All other components within normal limits  COMPREHENSIVE METABOLIC PANEL - Abnormal; Notable for the following components:   Glucose, Bld 116 (*)    Albumin 5.3 (*)    AST 53 (*)    Total Bilirubin 1.6 (*)    All other components within normal limits  URINALYSIS, COMPLETE (UACMP) WITH MICROSCOPIC - Abnormal; Notable for the following components:   Color, Urine YELLOW (*)    APPearance CLEAR (*)    Bilirubin Urine SMALL (*)    Ketones, ur 20 (*)    Protein, ur 100 (*)    Squamous Epithelial / LPF 0-5 (*)    All other components within normal  limits  URINE DRUG SCREEN, QUALITATIVE ([facility] ONLY) - Abnormal; Notable for the following components:   Cannabinoid 50 Ng, Ur [place] POSITIVE (*)    All other components within normal limits  TROPONIN I  ETHANOL  AMMONIA   ____________________________________________  EKG  none ____________________________________________  RADIOLOGY  ED MD interpretation:    CT head: NAD  CT abd and pelvis: NAD  Official radiology report(s): Ct Head Wo Contrast  Result Date: 04/21/2017 CLINICAL DATA:  Altered level of consciousness.  Recent falls. EXAM: CT HEAD WITHOUT CONTRAST TECHNIQUE: Contiguous axial images were obtained from the base of the skull through the vertex without intravenous contrast. COMPARISON:  Head CT 03/27/2017 FINDINGS: Brain: No intracranial hemorrhage, mass effect, or midline shift. No hydrocephalus. The basilar cisterns are patent. No evidence of territorial infarct or acute ischemia. No extra-axial or intracranial fluid collection. Vascular: No hyperdense vessel or unexpected calcification. Skull: No fracture or focal lesion. Sinuses/Orbits: Paranasal sinuses and mastoid air cells are clear. The visualized orbits are unremarkable. Other: None. IMPRESSION: No acute  intracranial abnormality. Electronically Signed   By: Rubye Oaks M.D.   On: 04/21/2017 02:35   Ct Abdomen Pelvis W Contrast  Result Date: 04/21/2017 CLINICAL DATA:  Abdomen pain EXAM: CT ABDOMEN AND PELVIS WITH CONTRAST TECHNIQUE: Multidetector CT imaging of the abdomen and pelvis was performed using the standard protocol following bolus administration of intravenous contrast. CONTRAST:  69mL ISOVUE-300 IOPAMIDOL (ISOVUE-300) INJECTION 61% COMPARISON:  None. FINDINGS: Lower chest: Lung bases demonstrate no acute consolidation or pleural effusion. Normal heart size. Hepatobiliary: No focal hepatic abnormality. Surgical clips in the gallbladder fossa. Prominent extrahepatic bile duct, likely due to post cholecystectomy change. Pancreas: Unremarkable. No pancreatic ductal dilatation or surrounding inflammatory changes. Spleen: Normal in size without focal abnormality. Adrenals/Urinary Tract: Adrenal glands are unremarkable. Kidneys are normal, without renal calculi, focal lesion, or hydronephrosis. Bladder is unremarkable. Stomach/Bowel: The stomach is nonenlarged. No dilated small bowel. Questionable bowel wall thickening involving the ascending colon, hepatic flexure and proximal transverse colon. Normal appendix. Vascular/Lymphatic: Mild aortic atherosclerosis. No significantly enlarged lymph nodes. No aneurysm Reproductive: Enlarged prostate gland. Other: Negative for free air or free fluid. Musculoskeletal: Status post bilateral hip replacement with normal alignment. Heterogeneous lucencies within the sacrum and iliac bones. IMPRESSION: 1. Collapsed appearance versus mild colitis of the ascending colon, hepatic flexure and proximal transverse colon. 2. There are no other acute abnormalities visualized Electronically Signed   By: Jasmine Pang M.D.   On: 04/21/2017 02:40    ____________________________________________   PROCEDURES  Procedure(s) performed: None  Procedures  Critical Care  performed: No  ____________________________________________   INITIAL IMPRESSION / ASSESSMENT AND PLAN / ED COURSE  As part of my medical decision making, I reviewed the following data within the electronic MEDICAL RECORD NUMBER Notes from prior ED visits and [place] Controlled Substance Database   This is a 52 year old male who comes into the hospital today with erratic behavior and hip pain.   The patient is pacing and walking around.  Differential diagnosis includes electrolyte abnormality, frontal lobe mass or abnormality, psychiatric problems.  The patient had some blood work drawn which was unremarkable.  Patient CT scans were also unremarkable.  The patient will be evaluated by psych.  Patient did receive a dose of Geodon for his agitation in the emergency department. The patient's care will be signed out to the oncoming physician who will follow up the recommendations of the psychiatrist      ____________________________________________   FINAL CLINICAL  IMPRESSION(S) / ED DIAGNOSES  Final diagnoses:  Pain of both hip joints  Agitation  Altered mental status, unspecified altered mental status type     ED Discharge Orders    None       Note:  This document was prepared using Dragon voice recognition software and may include unintentional dictation errors.    Rebecka Apley, MD 04/21/17 [phone]

## 2017-04-22 ENCOUNTER — Encounter: Payer: Self-pay | Admitting: Emergency Medicine

## 2017-04-22 ENCOUNTER — Other Ambulatory Visit: Payer: Self-pay

## 2017-04-22 ENCOUNTER — Emergency Department
Admission: EM | Admit: 2017-04-22 | Discharge: 2017-04-22 | Disposition: A | Discharge status: Home / Self Care | Payer: Medicare Other | Attending: Emergency Medicine | Admitting: Emergency Medicine

## 2017-04-22 DIAGNOSIS — Z79899 Other long term (current) drug therapy: Secondary | ICD-10-CM | POA: Insufficient documentation

## 2017-04-22 DIAGNOSIS — F1910 Other psychoactive substance abuse, uncomplicated: Secondary | ICD-10-CM

## 2017-04-22 DIAGNOSIS — Z96643 Presence of artificial hip joint, bilateral: Secondary | ICD-10-CM | POA: Insufficient documentation

## 2017-04-22 LAB — CBC
HCT: 44.8 % (ref 40.0–52.0)
Hemoglobin: 15.4 g/dL (ref 13.0–18.0)
MCH: 33.4 pg (ref 26.0–34.0)
MCHC: 34.4 g/dL (ref 32.0–36.0)
MCV: 97.1 fL (ref 80.0–100.0)
Platelets: 111 10*3/uL — ABNORMAL LOW (ref 150–440)
RBC: 4.61 MIL/uL (ref 4.40–5.90)
RDW: 13.7 % (ref 11.5–14.5)
WBC: 6.9 10*3/uL (ref 3.8–10.6)

## 2017-04-22 LAB — COMPREHENSIVE METABOLIC PANEL
ALK PHOS: 61 U/L (ref 38–126)
ALT: 58 U/L (ref 17–63)
AST: 58 U/L — AB (ref 15–41)
Albumin: 5.1 g/dL — ABNORMAL HIGH (ref 3.5–5.0)
Anion gap: 16 — ABNORMAL HIGH (ref 5–15)
BILIRUBIN TOTAL: 2.8 mg/dL — AB (ref 0.3–1.2)
BUN: 19 mg/dL (ref 6–20)
CALCIUM: 9.1 mg/dL (ref 8.9–10.3)
CHLORIDE: 100 mmol/L — AB (ref 101–111)
CO2: 24 mmol/L (ref 22–32)
CREATININE: 0.83 mg/dL (ref 0.61–1.24)
Glucose, Bld: 101 mg/dL — ABNORMAL HIGH (ref 65–99)
Potassium: 3.2 mmol/L — ABNORMAL LOW (ref 3.5–5.1)
Sodium: 140 mmol/L (ref 135–145)
Total Protein: 7.7 g/dL (ref 6.5–8.1)

## 2017-04-22 LAB — URINE DRUG SCREEN, QUALITATIVE (ARMC ONLY)
Amphetamines, Ur Screen: NOT DETECTED
Barbiturates, Ur Screen: NOT DETECTED
Benzodiazepine, Ur Scrn: POSITIVE — AB
Cannabinoid 50 Ng, Ur ~~LOC~~: POSITIVE — AB
Cocaine Metabolite,Ur ~~LOC~~: NOT DETECTED
MDMA (Ecstasy)Ur Screen: NOT DETECTED
Methadone Scn, Ur: NOT DETECTED
Opiate, Ur Screen: NOT DETECTED
Phencyclidine (PCP) Ur S: NOT DETECTED
Tricyclic, Ur Screen: NOT DETECTED

## 2017-04-22 LAB — ETHANOL: Alcohol, Ethyl (B): <10 mg/dL (ref <10)

## 2017-04-22 NOTE — ED Notes (Signed)
Pt reports last taking "6 Gabapentin and one of my husbands sleeping meds" last night.  Pt unable to recall name of sleeping medication.

## 2017-04-22 NOTE — ED Notes (Signed)
Pt given a cup of sprite.  

## 2017-04-22 NOTE — ED Provider Notes (Signed)
[facility] Emergency Department Provider Note   ____________________________________________   I have reviewed the triage vital signs and the nursing notes.   HISTORY  Chief Complaint Drug abuse  History limited by: Not Limited   HPI Roy Trujillo is a 52 y.o. male who presents to the emergency department today because of concern that he is abusing drugs. He states that this has been going on for roughly the past 6 months. Previous to that he had been abusing alcohol. He states that he has been taking his husbands medications. Has been taking them to get high and forget things. He denies any intention of hurting himself or suicidal ideation.    Per medical record review patient was in the ER and was seen by psychiatry yesterday for abnormal behavior. At that time psychiatry did not feel patient was a danger to himself or others.   Past Medical History:  Diagnosis Date  . Avascular necrosis (HCC)   . Depression   . GERD (gastroesophageal reflux disease)   . HIV (human immunodeficiency virus infection) [facility])     Patient Active Problem List   Diagnosis Date Noted  . Dysthymia 04/21/2017  . Severe recurrent major depression without psychotic features (HCC) 02/10/2017  . 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 Altered mental status   . Encephalopathy acute 02/09/2017  . Alcohol abuse 05/15/2016  . Depression 05/15/2016  . 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 Avascular necrosis of bone of hip, right (HCC) 03/31/2016  . HIV infection (HCC) 03/04/1996    Past Surgical History:  Procedure Laterality Date  . CHOLECYSTECTOMY  2013  . HERNIA REPAIR  1974   double inguinal  . JOINT REPLACEMENT Left    left hip  . TONSILLECTOMY     as a child  . TOTAL HIP ARTHROPLASTY Right 05/15/2016   Procedure: TOTAL HIP ARTHROPLASTY;  Surgeon: Donato Heinz, MD;  Location: [facility] ORS;  Service: Orthopedics;  Laterality: Right;    Prior to  Admission medications   Medication Sig Start Date End Date Taking? Authorizing Provider  DESCOVY 200-25 MG tablet Take 1 tablet by mouth daily. 10/01/16   [provider]  dolutegravir (TIVICAY) 50 MG tablet Take 1 tablet by mouth 2 (two) times daily. 03/27/15   [provider]  FLUoxetine (PROZAC) 20 MG capsule Take 1 capsule (20 mg total) by mouth daily. Patient not taking: Reported on 04/21/2017 02/12/17   [name], MD  LORazepam (ATIVAN) 1 MG tablet Take 1 tablet (1 mg total) by mouth 2 (two) times daily. 04/21/17 04/21/18  Emily Filbert, MD  promethazine (PHENERGAN) 25 MG tablet Take 1 tablet (25 mg total) by mouth every 4 (four) hours as needed for nausea or vomiting. 04/19/15   Emily Filbert, MD  traMADol (ULTRAM) 50 MG tablet Take 1-2 tablets (50-100 mg total) by mouth every 4 (four) hours as needed for moderate pain. Patient taking differently: Take 100 mg by mouth at bedtime.  05/17/16   Tera Partridge, PA    Allergies Penicillins; Bactrim [sulfamethoxazole-trimethoprim]; and Zerit [stavudine]  No family history on file.  Social History Social History   Tobacco Use  . Smoking status: Never Smoker  . Smokeless tobacco: Never Used  Substance Use Topics  . Alcohol use: No    Comment: Previous Alcohol Dependence  . Drug use: Yes    Types: Marijuana    Comment: Prescription Drugs    Review of Systems Constitutional: No fever/chills Eyes: No  visual changes. ENT: No sore throat. Cardiovascular: Denies chest pain. Respiratory: Denies shortness of breath. Gastrointestinal: No abdominal pain.  No nausea, no vomiting.  No diarrhea.   Genitourinary: Negative for dysuria. Musculoskeletal: Negative for back pain. Skin: Negative for rash. Neurological: Negative for headaches, focal weakness or numbness.  ____________________________________________   PHYSICAL EXAM:  VITAL SIGNS: ED Triage Vitals  Enc Vitals Group     BP 04/22/17 1425  (!) 195/107     Pulse Rate 04/22/17 1425 (!) 106     Resp 04/22/17 1425 (!) 22     Temp 04/22/17 1425 98.7 F (37.1 C)     Temp Source 04/22/17 1425 Oral     SpO2 04/22/17 1425 96 %     Weight 04/22/17 1427 115 lb (52.2 kg)     Height 04/22/17 1427 5\' 6"  (1.676 m)     Head Circumference --      Peak Flow --      Pain Score 04/22/17 1436 7   Constitutional: Alert and oriented. Well appearing and in no distress. Eyes: Conjunctivae are normal.  ENT   Head: Normocephalic and atraumatic.   Nose: No congestion/rhinnorhea.   Mouth/Throat: Mucous membranes are moist.   Neck: No stridor. Hematological/Lymphatic/Immunilogical: No cervical lymphadenopathy. Cardiovascular: Normal rate, regular rhythm.  No murmurs, rubs, or gallops.  Respiratory: Normal respiratory effort without tachypnea nor retractions. Breath sounds are clear and equal bilaterally. No wheezes/rales/rhonchi. Gastrointestinal: Soft and non tender. No rebound. No guarding.  Genitourinary: Deferred Musculoskeletal: Normal range of motion in all extremities. No lower extremity edema. Neurologic:  Normal speech and language. No gross focal neurologic deficits are appreciated.  Skin:  Skin is warm, dry and intact. No rash noted. Psychiatric: Mood and affect are normal. Speech and behavior are normal. Patient exhibits appropriate insight and judgment.  ____________________________________________    LABS (pertinent positives/negatives)  Ethanol <10 Cbc wnl except plt 111 cmp k 3.2, glu 101, cr 0.83  ____________________________________________   EKG  None  ____________________________________________    RADIOLOGY  None  ____________________________________________   PROCEDURES  Procedures  ____________________________________________   INITIAL IMPRESSION / ASSESSMENT AND PLAN / ED COURSE  Pertinent labs & imaging results that were available during my care of the patient were reviewed by me  and considered in my medical decision making (see chart for details).  Patient presented to the emergency department today because he would like to stop abusing drugs to get high and forget things.  Patient previously had issues with alcohol abuse.  Patient denies any suicidal ideation self-harm intent.  Was seen by psychiatry yesterday and cleared.  Discussed with patient that we do not do detox here at our facility but will give patient [facility] and [facility] follow-up information.  Encouraged patient to go to [facility] today.   ____________________________________________   FINAL CLINICAL IMPRESSION(S) / ED DIAGNOSES  Final diagnoses:  Drug abuse [facility])     Note: This dictation was prepared with Dragon dictation. Any transcriptional errors that result from this process are unintentional     [facility], MD 04/22/17 1521

## 2017-04-22 NOTE — ED Notes (Signed)

## 2017-04-22 NOTE — Discharge Instructions (Signed)
Please seek medical attention and help for any thoughts about wanting to harm yourself, harm others, any concerning change in behavior, severe depression, inappropriate drug use or any other new or concerning symptoms. ° °

## 2017-04-22 NOTE — ED Triage Notes (Signed)
Pt in via POV, states, "I have a tendency to take my husbands medicine."  Pt reports taking medication to "get high."  Pt states, "I have got to get some help."  Pt denies SI/HI.  Pt tearful in triage.

## 2017-04-22 NOTE — ED Notes (Signed)
Pt dressed out per this RN and ED tech, Crystal.  Belongings (Clothing, Shoes, [place], Wedding Band) handed off to pt's husband, Doyce Para at this time.

## 2017-11-12 ENCOUNTER — Other Ambulatory Visit: Payer: Self-pay | Admitting: Family Medicine

## 2017-11-12 DIAGNOSIS — N433 Hydrocele, unspecified: Secondary | ICD-10-CM

## 2017-11-14 ENCOUNTER — Emergency Department
Admission: EM | Admit: 2017-11-14 | Discharge: 2017-11-14 | Disposition: A | Discharge status: Home / Self Care | Payer: Medicare Other | Attending: Emergency Medicine | Admitting: Emergency Medicine

## 2017-11-14 ENCOUNTER — Other Ambulatory Visit: Payer: Self-pay

## 2017-11-14 ENCOUNTER — Emergency Department: Payer: Medicare Other

## 2017-11-14 ENCOUNTER — Encounter: Payer: Self-pay | Admitting: Emergency Medicine

## 2017-11-14 DIAGNOSIS — Z9049 Acquired absence of other specified parts of digestive tract: Secondary | ICD-10-CM | POA: Diagnosis not present

## 2017-11-14 DIAGNOSIS — F329 Major depressive disorder, single episode, unspecified: Secondary | ICD-10-CM | POA: Diagnosis not present

## 2017-11-14 DIAGNOSIS — Z96643 Presence of artificial hip joint, bilateral: Secondary | ICD-10-CM | POA: Diagnosis not present

## 2017-11-14 DIAGNOSIS — N5089 Other specified disorders of the male genital organs: Secondary | ICD-10-CM

## 2017-11-14 DIAGNOSIS — Z79899 Other long term (current) drug therapy: Secondary | ICD-10-CM | POA: Diagnosis not present

## 2017-11-14 DIAGNOSIS — Z21 Asymptomatic human immunodeficiency virus [HIV] infection status: Secondary | ICD-10-CM | POA: Insufficient documentation

## 2017-11-14 DIAGNOSIS — N50811 Right testicular pain: Secondary | ICD-10-CM | POA: Diagnosis present

## 2017-11-14 DIAGNOSIS — F1010 Alcohol abuse, uncomplicated: Secondary | ICD-10-CM | POA: Insufficient documentation

## 2017-11-14 DIAGNOSIS — N50819 Testicular pain, unspecified: Secondary | ICD-10-CM

## 2017-11-14 DIAGNOSIS — N433 Hydrocele, unspecified: Secondary | ICD-10-CM

## 2017-11-14 LAB — COMPREHENSIVE METABOLIC PANEL
ALK PHOS: 120 U/L (ref 38–126)
ALT: 129 U/L — ABNORMAL HIGH (ref 0–44)
ANION GAP: 6 (ref 5–15)
AST: 75 U/L — ABNORMAL HIGH (ref 15–41)
Albumin: 4.1 g/dL (ref 3.5–5.0)
BILIRUBIN TOTAL: 0.7 mg/dL (ref 0.3–1.2)
BUN: 6 mg/dL (ref 6–20)
CALCIUM: 9 mg/dL (ref 8.9–10.3)
CO2: 27 mmol/L (ref 22–32)
Chloride: 106 mmol/L (ref 98–111)
Creatinine, Ser: 0.91 mg/dL (ref 0.61–1.24)
Glucose, Bld: 92 mg/dL (ref 70–99)
POTASSIUM: 4.6 mmol/L (ref 3.5–5.1)
Sodium: 139 mmol/L (ref 135–145)
TOTAL PROTEIN: 6.6 g/dL (ref 6.5–8.1)

## 2017-11-14 LAB — URINALYSIS, COMPLETE (UACMP) WITH MICROSCOPIC
BILIRUBIN URINE: NEGATIVE
Bacteria, UA: NONE SEEN
GLUCOSE, UA: NEGATIVE mg/dL
HGB URINE DIPSTICK: NEGATIVE
KETONES UR: NEGATIVE mg/dL
Leukocytes, UA: NEGATIVE
NITRITE: NEGATIVE
PROTEIN: NEGATIVE mg/dL
Specific Gravity, Urine: 1.011 (ref 1.005–1.030)
Squamous Epithelial / LPF: NONE SEEN (ref 0–5)
pH: 7 (ref 5.0–8.0)

## 2017-11-14 LAB — CBC WITH DIFFERENTIAL/PLATELET
Basophils Absolute: 0 10*3/uL (ref 0–0.1)
Basophils Relative: 1 %
Eosinophils Absolute: 0.1 10*3/uL (ref 0–0.7)
Eosinophils Relative: 3 %
HEMATOCRIT: 38.8 % — AB (ref 40.0–52.0)
HEMOGLOBIN: 13.7 g/dL (ref 13.0–18.0)
LYMPHS ABS: 1.1 10*3/uL (ref 1.0–3.6)
Lymphocytes Relative: 27 %
MCH: 34.7 pg — AB (ref 26.0–34.0)
MCHC: 35.4 g/dL (ref 32.0–36.0)
MCV: 98.1 fL (ref 80.0–100.0)
MONO ABS: 0.5 10*3/uL (ref 0.2–1.0)
MONOS PCT: 11 %
NEUTROS ABS: 2.3 10*3/uL (ref 1.4–6.5)
NEUTROS PCT: 58 %
Platelets: 117 10*3/uL — ABNORMAL LOW (ref 150–440)
RBC: 3.96 MIL/uL — ABNORMAL LOW (ref 4.40–5.90)
RDW: 13.6 % (ref 11.5–14.5)
WBC: 4 10*3/uL (ref 3.8–10.6)

## 2017-11-14 LAB — CHLAMYDIA/NGC RT PCR (ARMC ONLY)
Chlamydia Tr: NOT DETECTED
N gonorrhoeae: NOT DETECTED

## 2017-11-14 MED ORDER — IBUPROFEN 400 MG PO TABS
600.0000 mg | ORAL_TABLET | Freq: Once | ORAL | Status: AC
  Administered 2017-11-14: 600 mg via ORAL
  Filled 2017-11-14: qty 2

## 2017-11-14 MED ORDER — OXYCODONE HCL 5 MG PO TABS
5.0000 mg | ORAL_TABLET | Freq: Once | ORAL | Status: AC
  Administered 2017-11-14: 5 mg via ORAL

## 2017-11-14 MED ORDER — OXYCODONE HCL 5 MG PO TABS
ORAL_TABLET | ORAL | Status: AC
  Filled 2017-11-14: qty 1

## 2017-11-14 NOTE — ED Triage Notes (Signed)
R testicle pain and swelling x 1 1/2 weeks. Denies injury. States did see MD about one week ago and waiting to be scheduled for ultrasound but had not gotten scheduled and pain increasing so came here for eval.

## 2017-11-14 NOTE — ED Notes (Signed)
Patient transported to Ultrasound 

## 2017-11-14 NOTE — ED Provider Notes (Signed)
[facility] Emergency Department Provider Note  ____________________________________________  Time seen: Approximately 5:57 PM  I have reviewed the triage vital signs and the nursing notes.   HISTORY  Chief Complaint Testicle Pain   HPI Roy Trujillo is a 52 y.o. male with a history of HIV (last CD4 8 and HIV load 30 in 07/2017) who presents for evaluation of testicular pain.  Patient reports 10 days of progressively worsening swelling and pain in his right testicle.  He denies any trauma.  He reports that the pain feels like he was kicked in his testicles, initially was mild however over the last few days has become severe.  He went to see his HIV doctor who ordered an outpatient ultrasound however the pain today was severe which prompted his visit to the emergency room.  The pain is worse when he walks.  Patient is sexually active with men but has only one partner who is his husband.  He denies any penile discharge or dysuria, denies abdominal or flank pain, denies fever or chills.  Past Medical History:  Diagnosis Date  . Avascular necrosis (HCC)   . Depression   . GERD (gastroesophageal reflux disease)   . HIV (human immunodeficiency virus infection) [facility])     Patient Active Problem List   Diagnosis Date Noted  . Dysthymia 04/21/2017  . Severe recurrent major depression without psychotic features (HCC) 02/10/2017  . 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 Altered mental status   . Encephalopathy acute 02/09/2017  . Alcohol abuse 05/15/2016  . Depression 05/15/2016  . 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 Avascular necrosis of bone of hip, right (HCC) 03/31/2016  . HIV infection (HCC) 03/04/1996    Past Surgical History:  Procedure Laterality Date  . CHOLECYSTECTOMY  2013  . HERNIA REPAIR  1974   double inguinal  . JOINT REPLACEMENT Left    left hip  . TONSILLECTOMY     as a child  . TOTAL HIP ARTHROPLASTY Right 05/15/2016     Procedure: TOTAL HIP ARTHROPLASTY;  Surgeon: Donato Heinz, MD;  Location: [facility] ORS;  Service: Orthopedics;  Laterality: Right;    Prior to Admission medications   Medication Sig Start Date End Date Taking? Authorizing Provider  DESCOVY 200-25 MG tablet Take 1 tablet by mouth daily. 10/01/16   [provider]  dolutegravir (TIVICAY) 50 MG tablet Take 1 tablet by mouth 2 (two) times daily. 03/27/15   [provider]  FLUoxetine (PROZAC) 20 MG capsule Take 1 capsule (20 mg total) by mouth daily. Patient not taking: Reported on 04/21/2017 02/12/17   [name], MD  LORazepam (ATIVAN) 1 MG tablet Take 1 tablet (1 mg total) by mouth 2 (two) times daily. 04/21/17 04/21/18  Emily Filbert, MD  promethazine (PHENERGAN) 25 MG tablet Take 1 tablet (25 mg total) by mouth every 4 (four) hours as needed for nausea or vomiting. 04/19/15   Emily Filbert, MD  traMADol (ULTRAM) 50 MG tablet Take 1-2 tablets (50-100 mg total) by mouth every 4 (four) hours as needed for moderate pain. Patient taking differently: Take 100 mg by mouth at bedtime.  05/17/16   Tera Partridge, PA    Allergies Penicillins; Bactrim [sulfamethoxazole-trimethoprim]; and Zerit [stavudine]  No family history on file.  Social History Social History   Tobacco Use  . Smoking status: Never Smoker  . Smokeless tobacco: Never Used  Substance Use Topics  . Alcohol use: No  Comment: Previous Alcohol Dependence  . Drug use: Yes    Types: Marijuana    Comment: Prescription Drugs    Review of Systems  Constitutional: Negative for fever. Eyes: Negative for visual changes. ENT: Negative for sore throat. Neck: No neck pain  Cardiovascular: Negative for chest pain. Respiratory: Negative for shortness of breath. Gastrointestinal: Negative for abdominal pain, vomiting or diarrhea. Genitourinary: Negative for dysuria. + Right testicular pain and swelling Musculoskeletal: Negative for back  pain. Skin: Negative for rash. Neurological: Negative for headaches, weakness or numbness. Psych: No SI or HI  ____________________________________________   PHYSICAL EXAM:  VITAL SIGNS: ED Triage Vitals  Enc Vitals Group     BP 11/14/17 1549 129/68     Pulse Rate 11/14/17 1549 75     Resp 11/14/17 1549 18     Temp 11/14/17 1549 98.5 F (36.9 C)     Temp Source 11/14/17 1549 Oral     SpO2 11/14/17 1549 98 %     Weight 11/14/17 1550 120 lb (54.4 kg)     Height 11/14/17 1550 5\' 5"  (1.651 m)     Head Circumference --      Peak Flow --      Pain Score 11/14/17 1549 9     Pain Loc --      Pain Edu? --      Excl. in GC? --     Constitutional: Alert and oriented. Well appearing and in no apparent distress. HEENT:      Head: Normocephalic and atraumatic.         Eyes: Conjunctivae are normal. Sclera is non-icteric.       Mouth/Throat: Mucous membranes are moist.       Neck: Supple with no signs of meningismus. Cardiovascular: Regular rate and rhythm. No murmurs, gallops, or rubs. 2+ symmetrical distal pulses are present in all extremities. No JVD. Respiratory: Normal respiratory effort. Lungs are clear to auscultation bilaterally. No wheezes, crackles, or rhonchi.  Gastrointestinal: Soft, non tender, and non distended with positive bowel sounds. No rebound or guarding. Genitourinary: No CVA tenderness. Bilateral testicles are descended, R scrotum is swollen, patient has diffuse R sided testicular tenderness on exam, bilateral positive cremasteric reflexes are present, no erythema of the scrotum. No evidence of inguinal hernia. Musculoskeletal: Nontender with normal range of motion in all extremities. No edema, cyanosis, or erythema of extremities. Neurologic: Normal speech and language. Face is symmetric. Moving all extremities. No gross focal neurologic deficits are appreciated. Skin: Skin is warm, dry and intact. No rash noted. Psychiatric: Mood and affect are normal. Speech and  behavior are normal.  ____________________________________________   LABS (all labs ordered are listed, but only abnormal results are displayed)  Labs Reviewed  CBC WITH DIFFERENTIAL/PLATELET - Abnormal; Notable for the following components:      Result Value   RBC 3.96 (*)    HCT 38.8 (*)    MCH 34.7 (*)    Platelets 117 (*)    All other components within normal limits  COMPREHENSIVE METABOLIC PANEL - Abnormal; Notable for the following components:   AST 75 (*)    ALT 129 (*)    All other components within normal limits  URINALYSIS, COMPLETE (UACMP) WITH MICROSCOPIC - Abnormal; Notable for the following components:   Color, Urine YELLOW (*)    APPearance CLEAR (*)    All other components within normal limits  CHLAMYDIA/NGC RT PCR [facility] ONLY)   ____________________________________________  EKG  none  ____________________________________________  RADIOLOGY  I have personally reviewed the images performed during this visit and I agree with the Radiologist's read.   Interpretation by Radiologist:  US Scrotum W/doppler  Result Date: 11/14/2017 CLINICAL DATA:  Testicle pain and swelling, right EXAM: SCROTAL ULTRASOUND DOPPLER ULTRASOUND OF THE TESTICLES TECHNIQUE: Complete ultrasound examination of the testicles, epididymis, and other scrotal structures was performed. Color and spectral Doppler ultrasound were also utilized to evaluate blood flow to the testicles. COMPARISON:  CT 04/21/2017 FINDINGS: Right testicle Measurements: 4.2 x 2.6 x 3.4 cm. No mass or microlithiasis visualized. Left testicle Measurements: 4.5 x 2.8 x 2.7 cm. No mass or microlithiasis visualized. Right epididymis:  Normal in size and appearance. Left epididymis:  Normal in size and appearance. Hydrocele:  Moderate right hydrocele.  Small left hydrocele Varicocele:  None visualized. Pulsed Doppler interrogation of both testes demonstrates normal low resistance arterial and venous waveforms  bilaterally. Limited ultrasound of the inguinal regions demonstrates no bowel containing hernia. IMPRESSION: 1. Negative for testicular torsion or testicular mass 2. Bilateral hydroceles, moderate on the right and small on the left Electronically Signed   By: Jasmine Pang M.D.   On: 11/14/2017 17:06     ____________________________________________   PROCEDURES  Procedure(s) performed: None Procedures Critical Care performed:  None ____________________________________________   INITIAL IMPRESSION / ASSESSMENT AND PLAN / ED COURSE  52 y.o. male with a history of HIV (last CD4 14 and HIV load 30 in 07/2017) who presents for evaluation of testicular pain x 10 days.  Patient is well-appearing and in no distress, has normal vital signs, there is no evidence of hernia or torsion on exam, he does have swollen right scrotum and diffuse tenderness in the right testicle with no palpable masses.  Ultrasound was done which confirms no evidence of torsion but does show a moderate size hydrocele on the right.  UA negative for infection.  GC and Chlamydia are pending.  Will give ibuprofen and oxycodone for pain.    _________________________ 7:20 PM on 11/14/2017 -----------------------------------------  GC and Chlamydia negative.  Recommended ice, anti-inflammatories, and referral to urology.  Discussed return precautions for any signs or symptoms of torsion.   As part of my medical decision making, I reviewed the following data within the electronic MEDICAL RECORD NUMBER Nursing notes reviewed and incorporated, Labs reviewed , Old chart reviewed, Radiograph reviewed , Notes from prior ED visits and [place] Controlled Substance Database    Pertinent labs & imaging results that were available during my care of the patient were reviewed by me and considered in my medical decision making (see chart for details).    ____________________________________________   FINAL CLINICAL IMPRESSION(S) / ED  DIAGNOSES  Final diagnoses:  Hydrocele in adult      NEW MEDICATIONS STARTED DURING THIS VISIT:  ED Discharge Orders    None       Note:  This document was prepared using Dragon voice recognition software and may include unintentional dictation errors.    Don Perking, [place], MD 11/14/17 Jerene Bears

## 2017-11-19 ENCOUNTER — Ambulatory Visit (INDEPENDENT_AMBULATORY_CARE_PROVIDER_SITE_OTHER): Payer: Medicare Other | Admitting: Urology

## 2017-11-19 ENCOUNTER — Encounter: Payer: Self-pay | Admitting: Urology

## 2017-11-19 VITALS — BP 155/77 | HR 59 | Ht 66.0 in | Wt 132.7 lb

## 2017-11-19 DIAGNOSIS — N433 Hydrocele, unspecified: Secondary | ICD-10-CM | POA: Diagnosis not present

## 2017-11-19 NOTE — Progress Notes (Signed)
11/19/2017 2:38 PM   Roy Trujillo 1965-04-19 [id number]  Referring provider: [facility] [address] [place], [place] [address]  Chief Complaint  Patient presents with  . Hydrocele    HPI: Patient is a 52 year old Caucasian male with a history of HIV who was referred from [facility] ED for hydrocele.  He presented to the ED on November 14, 2017 with a complaint of testicular pain.  Scrotal ultrasound on 11/14/2017 revealed negative for testicular torsion or testicular mass.  Bilateral hydroceles, moderate on the right and small on the left   Labs in the ED:  Chlamydia/gonorrhea PCR were negative,  UA was positive for 0-5 WBC's, serum creatinine 0.91 and WBC count was 4.0.     Meds given in the ED: ibuprofen po and oxycodone po  Prior urological history:  None.     Current NSAID/anticoagulation:   ibuprofen prn   Today, he states that it occurred 2 weeks ago and his scrotum was swollen and hard.  He has noted that it is been improving over the last few days, but he is still concerned.  He denied any trauma to the scrotal area, a history of infection to the scrotal or testicular area and any surgery in the scrotal area.  He is having urinary frequency, nocturia and intermittency.    PMH: Past Medical History:  Diagnosis Date  . Arthritis of hand   . Avascular necrosis (HCC)   . Depression   . GERD (gastroesophageal reflux disease)   . HIV (human immunodeficiency virus infection) [facility])     Surgical History: Past Surgical History:  Procedure Laterality Date  . CHOLECYSTECTOMY  2013  . HERNIA REPAIR  1974   double inguinal  . JOINT REPLACEMENT Left    left hip  . TONSILLECTOMY     as a child  . TOTAL HIP ARTHROPLASTY Right 05/15/2016   Procedure: TOTAL HIP ARTHROPLASTY;  Surgeon: Donato Heinz, MD;  Location: [facility] ORS;  Service: Orthopedics;  Laterality: Right;    Home Medications:  Allergies as of 11/19/2017      Reactions   Penicillins Other (See Comments)   Unknown childhood reaction Has patient had a PCN reaction causing immediate rash, facial/tongue/throat swelling, SOB or lightheadedness with hypotension: Unknown Has patient had a PCN reaction causing severe rash involving mucus membranes or skin necrosis: Unknown Has patient had a PCN reaction that required hospitalization: Unknown Has patient had a PCN reaction occurring within the last 10 years: Unknown If all of the above answers are "NO", then may proceed with Cephalosporin use.   Bactrim [sulfamethoxazole-trimethoprim] Rash   Zerit [stavudine] Rash      Medication List        Accurate as of 11/19/17  2:38 PM. Always use your most recent med list.          DESCOVY 200-25 MG tablet Generic drug:  emtricitabine-tenofovir AF Take 1 tablet by mouth daily.   mirtazapine 15 MG tablet Commonly known as:  REMERON Take 15 mg by mouth at bedtime.   promethazine 25 MG tablet Commonly known as:  PHENERGAN Take 1 tablet (25 mg total) by mouth every 4 (four) hours as needed for nausea or vomiting.   TIVICAY 50 MG tablet Generic drug:  dolutegravir Take 1 tablet by mouth 2 (two) times daily.       Allergies:  Allergies  Allergen Reactions  . Penicillins Other (See Comments)    Unknown childhood reaction Has patient had a PCN reaction  causing immediate rash, facial/tongue/throat swelling, SOB or lightheadedness with hypotension: Unknown Has patient had a PCN reaction causing severe rash involving mucus membranes or skin necrosis: Unknown Has patient had a PCN reaction that required hospitalization: Unknown Has patient had a PCN reaction occurring within the last 10 years: Unknown If all of the above answers are "NO", then may proceed with Cephalosporin use.   . Bactrim [Sulfamethoxazole-Trimethoprim] Rash  . Zerit [Stavudine] Rash    Family History: No family history on file.  Social History:  reports that he has never smoked. He has  never used smokeless tobacco. He reports that he has current or past drug history. Drug: Marijuana. He reports that he does not drink alcohol.  ROS: UROLOGY Frequent Urination?: Yes Hard to postpone urination?: No Burning/pain with urination?: No Get up at night to urinate?: Yes Leakage of urine?: No Urine stream starts and stops?: Yes Trouble starting stream?: No Do you have to strain to urinate?: No Blood in urine?: No Urinary tract infection?: No Sexually transmitted disease?: Yes Injury to kidneys or bladder?: No Painful intercourse?: No Weak stream?: No Erection problems?: No Penile pain?: No  Gastrointestinal Nausea?: No Vomiting?: No Indigestion/heartburn?: No Diarrhea?: No Constipation?: No  Constitutional Fever: No Night sweats?: No Weight loss?: No Fatigue?: No  Skin Skin rash/lesions?: No Itching?: No  Eyes Blurred vision?: No Double vision?: No  Ears/Nose/Throat Sore throat?: No Sinus problems?: No  Hematologic/Lymphatic Swollen glands?: No Easy bruising?: No  Cardiovascular Leg swelling?: No Chest pain?: No  Respiratory Cough?: No Shortness of breath?: No  Endocrine Excessive thirst?: No  Musculoskeletal Back pain?: No Joint pain?: No  Neurological Headaches?: No Dizziness?: No  Psychologic Depression?: Yes Anxiety?: Yes  Physical Exam: BP (!) 155/77 (BP Location: Left Arm, Patient Position: Sitting, Cuff Size: Normal)   Pulse (!) 59   Ht 5\' 6"  (1.676 m)   Wt 132 lb 11.2 oz (60.2 kg)   BMI 21.42 kg/m   Constitutional:  Well nourished. Alert and oriented, No acute distress. HEENT: Pump Back AT, moist mucus membranes.  Trachea midline, no masses. Cardiovascular: No clubbing, cyanosis, or edema. Respiratory: Normal respiratory effort, no increased work of breathing. GI: Abdomen is soft, non tender, non distended, no abdominal masses. Liver and spleen not palpable.  No hernias appreciated.  Stool sample for occult testing is not  indicated.   GU: No CVA tenderness.  No bladder fullness or masses.  Patient with circumcised phallus.  Urethral meatus is patent.  No penile discharge. No penile lesions or rashes. Scrotum without lesions, cysts, rashes and/or edema.  Testicles are located scrotally bilaterally. No masses are appreciated in the testicles. Left and right epididymis are normal. Mild induration in the right spermatic cord.  Rectal: Patient with  normal sphincter tone. Anus and perineum without scarring or rashes. No rectal masses are appreciated. Prostate is approximately 45 grams, no nodules are appreciated. Seminal vesicles are normal. Skin: No rashes, bruises or suspicious lesions. Lymph: No cervical or inguinal adenopathy. Neurologic: Grossly intact, no focal deficits, moving all 4 extremities. Psychiatric: Normal mood and affect.  Laboratory Data: Lab Results  Component Value Date   WBC 4.0 11/14/2017   HGB 13.7 11/14/2017   HCT 38.8 (L) 11/14/2017   MCV 98.1 11/14/2017   PLT 117 (L) 11/14/2017    Lab Results  Component Value Date   CREATININE 0.91 11/14/2017    No results found for: PSA  No results found for: TESTOSTERONE  No results found for: HGBA1C  Lab Results  Component Value Date   TSH 0.809 02/09/2017    No results found for: CHOL, HDL, CHOLHDL, VLDL, LDLCALC  Lab Results  Component Value Date   AST 75 (H) 11/14/2017   Lab Results  Component Value Date   ALT 129 (H) 11/14/2017   No components found for: ALKALINEPHOPHATASE No components found for: BILIRUBINTOTAL  No results found for: ESTRADIOL  Urinalysis    Component Value Date/Time   COLORURINE YELLOW (A) 11/14/2017 1643   APPEARANCEUR CLEAR (A) 11/14/2017 1643   LABSPEC 1.011 11/14/2017 1643   PHURINE 7.0 11/14/2017 1643   GLUCOSEU NEGATIVE 11/14/2017 1643   HGBUR NEGATIVE 11/14/2017 1643   BILIRUBINUR NEGATIVE 11/14/2017 1643   KETONESUR NEGATIVE 11/14/2017 1643   PROTEINUR NEGATIVE 11/14/2017 1643   NITRITE  NEGATIVE 11/14/2017 1643   LEUKOCYTESUR NEGATIVE 11/14/2017 1643    I have reviewed the labs.   Pertinent Imaging: CLINICAL DATA:  Testicle pain and swelling, right  EXAM: SCROTAL ULTRASOUND  DOPPLER ULTRASOUND OF THE TESTICLES  TECHNIQUE: Complete ultrasound examination of the testicles, epididymis, and other scrotal structures was performed. Color and spectral Doppler ultrasound were also utilized to evaluate blood flow to the testicles.  COMPARISON:  CT 04/21/2017  FINDINGS: Right testicle  Measurements: 4.2 x 2.6 x 3.4 cm. No mass or microlithiasis visualized.  Left testicle  Measurements: 4.5 x 2.8 x 2.7 cm. No mass or microlithiasis visualized.  Right epididymis:  Normal in size and appearance.  Left epididymis:  Normal in size and appearance.  Hydrocele:  Moderate right hydrocele.  Small left hydrocele  Varicocele:  None visualized.  Pulsed Doppler interrogation of both testes demonstrates normal low resistance arterial and venous waveforms bilaterally. Limited ultrasound of the inguinal regions demonstrates no bowel containing hernia.  IMPRESSION: 1. Negative for testicular torsion or testicular mass 2. Bilateral hydroceles, moderate on the right and small on the left   Electronically Signed   By: Jasmine Pang M.D.   On: 11/14/2017 17:06  I have independently reviewed the films.    Assessment & Plan:    1. Bilateral hydroceles Explained to the patient that a hydrocele is a collection of peritoneal fluid between the parietal and visceral layers of the tunica vaginalis, which directly surrounds the testis and spermatic cord.  It is the same layer that forms the peritoneal lining of the abdomen. Hydroceles are believed to arise from an imbalance of secretion and reabsorption of fluid from the tunica vaginalis It may be idiopathic hydroceles or reactive hydroceles n acute reactive hydrocele, which often resolves with treatment of the  underlying condition. As he is noted some improvement, we will continue conservative management and have him return in 2 weeks for recheck. Patient is advised that if he should start to experience a worsening in the pain, increased swelling, erythema and/or change in the coloring of the scrotum, nausea and/or vomiting and/or fevers greater than 103 or shaking chills to contact the office immediately or seek treatment in the emergency department for emergent intervention.    Return in about 2 weeks (around 12/03/2017) for recheck .  These notes generated with voice recognition software. I apologize for typographical errors.  Michiel Cowboy, PA-C  [facility] [address]  [address] [place], [place] [address] [phone]

## 2017-11-25 ENCOUNTER — Encounter: Payer: Self-pay | Admitting: Urology

## 2017-11-27 ENCOUNTER — Encounter: Payer: Self-pay | Admitting: Urology

## 2017-11-27 ENCOUNTER — Ambulatory Visit (INDEPENDENT_AMBULATORY_CARE_PROVIDER_SITE_OTHER): Payer: Medicare Other | Admitting: Urology

## 2017-11-27 VITALS — BP 148/75 | HR 66 | Ht 66.0 in | Wt 128.6 lb

## 2017-11-27 DIAGNOSIS — N5082 Scrotal pain: Secondary | ICD-10-CM

## 2017-11-27 DIAGNOSIS — K4090 Unilateral inguinal hernia, without obstruction or gangrene, not specified as recurrent: Secondary | ICD-10-CM

## 2017-11-27 DIAGNOSIS — N433 Hydrocele, unspecified: Secondary | ICD-10-CM

## 2017-11-27 MED ORDER — HYDROCODONE-ACETAMINOPHEN 5-300 MG PO TABS: 1.0000 | ORAL_TABLET | Freq: Three times a day (TID) | ORAL | 0 refills | Status: DC | PRN

## 2017-11-27 NOTE — Progress Notes (Signed)
11/27/2017 2:53 PM   Roy Trujillo 1965-05-11 [id number]  Referring provider: Center, [facility] [address] [place], [place] [address]  Chief Complaint  Patient presents with  . Hydrocele    HPI: Patient is a 52 year old Caucasian male with a history of HIV who was referred from [facility] ED for hydrocele.  He presented to the ED on November 14, 2017 with a complaint of testicular pain.  Scrotal ultrasound on 11/14/2017 revealed negative for testicular torsion or testicular mass.  Bilateral hydroceles, moderate on the right and small on the left   Labs in the ED:  Chlamydia/gonorrhea PCR were negative,  UA was positive for 0-5 WBC's, serum creatinine 0.91 and WBC count was 4.0.     Meds given in the ED: ibuprofen po and oxycodone po  Prior urological history:  None.     Current NSAID/anticoagulation:   ibuprofen prn   At his visit on 11/19/2017, he stated that 2 weeks ago and his scrotum was swollen and hard.  He has noted that it is been improving over the last few days, but he is still concerned.  He denied any trauma to the scrotal area, a history of infection to the scrotal or testicular area and any surgery in the scrotal area.  He is having urinary frequency, nocturia and intermittency.    He states that everything was relatively stable until 2 days ago when the pain awoke him from sleep.  He states the pain was mostly in the right spermatic cord.  It was sharp and lasted for several minutes.  He states it was so painful it brought tears to his eyes.  Patient denies any gross hematuria, dysuria or suprapubic/flank pain.  Patient denies any fevers, chills, nausea or vomiting.   PMH: Past Medical History:  Diagnosis Date  . Arthritis of hand   . Avascular necrosis (HCC)   . Depression   . GERD (gastroesophageal reflux disease)   . HIV (human immunodeficiency virus infection) [facility])     Surgical History: Past Surgical History:  Procedure  Laterality Date  . CHOLECYSTECTOMY  2013  . HERNIA REPAIR  1974   double inguinal  . JOINT REPLACEMENT Left    left hip  . TONSILLECTOMY     as a child  . TOTAL HIP ARTHROPLASTY Right 05/15/2016   Procedure: TOTAL HIP ARTHROPLASTY;  Surgeon: Donato Heinz, MD;  Location: [facility] ORS;  Service: Orthopedics;  Laterality: Right;    Home Medications:  Allergies as of 11/27/2017      Reactions   Penicillins Other (See Comments)   Unknown childhood reaction Has patient had a PCN reaction causing immediate rash, facial/tongue/throat swelling, SOB or lightheadedness with hypotension: Unknown Has patient had a PCN reaction causing severe rash involving mucus membranes or skin necrosis: Unknown Has patient had a PCN reaction that required hospitalization: Unknown Has patient had a PCN reaction occurring within the last 10 years: Unknown If all of the above answers are "NO", then may proceed with Cephalosporin use.   Bactrim [sulfamethoxazole-trimethoprim] Rash   Zerit [stavudine] Rash      Medication List        Accurate as of 11/27/17  2:53 PM. Always use your most recent med list.          DESCOVY 200-25 MG tablet Generic drug:  emtricitabine-tenofovir AF Take 1 tablet by mouth daily.   HYDROcodone-Acetaminophen 5-300 MG Tabs Take 1 tablet by mouth every 8 (eight) hours as needed.   mirtazapine  15 MG tablet Commonly known as:  REMERON Take 15 mg by mouth at bedtime.   promethazine 25 MG tablet Commonly known as:  PHENERGAN Take 1 tablet (25 mg total) by mouth every 4 (four) hours as needed for nausea or vomiting.   TIVICAY 50 MG tablet Generic drug:  dolutegravir Take 1 tablet by mouth 2 (two) times daily.       Allergies:  Allergies  Allergen Reactions  . Penicillins Other (See Comments)    Unknown childhood reaction Has patient had a PCN reaction causing immediate rash, facial/tongue/throat swelling, SOB or lightheadedness with hypotension: Unknown Has patient had a  PCN reaction causing severe rash involving mucus membranes or skin necrosis: Unknown Has patient had a PCN reaction that required hospitalization: Unknown Has patient had a PCN reaction occurring within the last 10 years: Unknown If all of the above answers are "NO", then may proceed with Cephalosporin use.   . Bactrim [Sulfamethoxazole-Trimethoprim] Rash  . Zerit [Stavudine] Rash    Family History: No family history on file.  Social History:  reports that he has never smoked. He has never used smokeless tobacco. He reports that he has current or past drug history. Drug: Marijuana. He reports that he does not drink alcohol.  ROS: UROLOGY Frequent Urination?: No Hard to postpone urination?: No Burning/pain with urination?: No Get up at night to urinate?: No Urine stream starts and stops?: No Trouble starting stream?: No Do you have to strain to urinate?: No Blood in urine?: No Urinary tract infection?: No Sexually transmitted disease?: No Injury to kidneys or bladder?: No Painful intercourse?: No Weak stream?: No Erection problems?: No Penile pain?: No  Gastrointestinal Nausea?: No Vomiting?: No Indigestion/heartburn?: No Diarrhea?: No Constipation?: No  Constitutional Fever: No Night sweats?: No Weight loss?: No Fatigue?: No  Skin Skin rash/lesions?: No Itching?: No  Eyes Blurred vision?: No Double vision?: No  Ears/Nose/Throat Sore throat?: No Sinus problems?: No  Hematologic/Lymphatic Easy bruising?: No  Cardiovascular Leg swelling?: No Chest pain?: No  Respiratory Cough?: No Shortness of breath?: No  Endocrine Excessive thirst?: No  Musculoskeletal Back pain?: No Joint pain?: No  Neurological Headaches?: No Dizziness?: No  Psychologic Depression?: No Anxiety?: No  Physical Exam: BP (!) 148/75 (BP Location: Left Arm, Patient Position: Sitting, Cuff Size: Normal)   Pulse 66   Ht 5\' 6"  (1.676 m)   Wt 128 lb 9.6 oz (58.3 kg)   BMI  20.76 kg/m   GU: No CVA tenderness.  No bladder fullness or masses.  Patient with circumcised phallus.  Urethral meatus is patent.  No penile discharge. No penile lesions or rashes. Scrotum without lesions, cysts, rashes and/or edema.  Testicles are located scrotally bilaterally. No masses are appreciated in the testicles. Left and right epididymis are normal. Thickened rightt spermatic cord.  ?gas when milking the cord ?possible hernia.  There is no erythema, crepitus or fluctuance noted on the right cord or right scrotal area .     Laboratory Data: Lab Results  Component Value Date   WBC 4.0 11/14/2017   HGB 13.7 11/14/2017   HCT 38.8 (L) 11/14/2017   MCV 98.1 11/14/2017   PLT 117 (L) 11/14/2017    Lab Results  Component Value Date   CREATININE 0.91 11/14/2017    No results found for: PSA  No results found for: TESTOSTERONE  No results found for: HGBA1C  Lab Results  Component Value Date   TSH 0.809 02/09/2017    No results found for: CHOL,  HDL, CHOLHDL, VLDL, LDLCALC  Lab Results  Component Value Date   AST 75 (H) 11/14/2017   Lab Results  Component Value Date   ALT 129 (H) 11/14/2017   No components found for: ALKALINEPHOPHATASE No components found for: BILIRUBINTOTAL  No results found for: ESTRADIOL  Urinalysis    Component Value Date/Time   COLORURINE YELLOW (A) 11/14/2017 1643   APPEARANCEUR CLEAR (A) 11/14/2017 1643   LABSPEC 1.011 11/14/2017 1643   PHURINE 7.0 11/14/2017 1643   GLUCOSEU NEGATIVE 11/14/2017 1643   HGBUR NEGATIVE 11/14/2017 1643   BILIRUBINUR NEGATIVE 11/14/2017 1643   KETONESUR NEGATIVE 11/14/2017 1643   PROTEINUR NEGATIVE 11/14/2017 1643   NITRITE NEGATIVE 11/14/2017 1643   LEUKOCYTESUR NEGATIVE 11/14/2017 1643    I have reviewed the labs.   Pertinent Imaging: CLINICAL DATA:  Testicle pain and swelling, right  EXAM: SCROTAL ULTRASOUND  DOPPLER ULTRASOUND OF THE TESTICLES  TECHNIQUE: Complete ultrasound examination  of the testicles, epididymis, and other scrotal structures was performed. Color and spectral Doppler ultrasound were also utilized to evaluate blood flow to the testicles.  COMPARISON:  CT 04/21/2017  FINDINGS: Right testicle  Measurements: 4.2 x 2.6 x 3.4 cm. No mass or microlithiasis visualized.  Left testicle  Measurements: 4.5 x 2.8 x 2.7 cm. No mass or microlithiasis visualized.  Right epididymis:  Normal in size and appearance.  Left epididymis:  Normal in size and appearance.  Hydrocele:  Moderate right hydrocele.  Small left hydrocele  Varicocele:  None visualized.  Pulsed Doppler interrogation of both testes demonstrates normal low resistance arterial and venous waveforms bilaterally. Limited ultrasound of the inguinal regions demonstrates no bowel containing hernia.  IMPRESSION: 1. Negative for testicular torsion or testicular mass 2. Bilateral hydroceles, moderate on the right and small on the left   Electronically Signed   By: Jasmine Pang M.D.   On: 11/14/2017 17:06  I have independently reviewed the films.    Assessment & Plan:    1. Bilateral hydroceles Reviewed with radiologist the scrotal ultrasound and it appears that on the right there is no hernia appreciated and it is just a complicated right hydrocele since exam has changed we will schedule a pelvic CT with contrast to ascertain whether or not there is a hernia located in the right hemiscrotum Patient is advised that if he should start to experience a worsening in the pain, increased swelling, erythema and/or change in the coloring of the scrotum, nausea and/or vomiting and/or fevers greater than 103 or shaking chills to contact the office immediately or seek treatment in the emergency department for emergent intervention.    2. Scrotal pain Given a prescription for Vicodin 5/300 #10.  We had a discussion regarding his history of addiction and he stated that he is going to  counseling and has the support of his husband at this time.  I reviewed the [place] controlled substance website and patient's last cardiac was filled in March 2018 and it was oxycodone.  He is advised that this is a potentially addictive substance, that it can suppress respirations and only to take it as prescribed.  He is not to drive or operate dangerous equipment while taking this medication.  He is also not to make important life decisions or mix with alcohol.  He understands this.    Return for CT of pelvis report .  These notes generated with voice recognition software. I apologize for typographical errors.  Michiel Cowboy, PA-C  [facility] [address]  [place], [place] [address] [phone]

## 2017-12-01 ENCOUNTER — Telehealth: Payer: Self-pay | Admitting: Urology

## 2017-12-01 MED ORDER — HYDROCODONE-ACETAMINOPHEN 5-325 MG PO TABS: 1.0000 | ORAL_TABLET | Freq: Four times a day (QID) | ORAL | 0 refills | Status: DC | PRN

## 2017-12-01 NOTE — Telephone Encounter (Signed)
Spoke to patient and the new RX was printed to be faxed to [employer]

## 2017-12-01 NOTE — Telephone Encounter (Signed)
Pt called and states insurance co said if you would change RX to Loracet they would cover that instead of Hydrocodone/Acetaminiphen.  Please call pt and let him know if this can be done.

## 2017-12-01 NOTE — Addendum Note (Signed)
Addended by: Honor Loh on: 12/01/2017 02:52 PM   Modules accepted: Orders

## 2017-12-03 ENCOUNTER — Ambulatory Visit: Payer: Self-pay | Admitting: Urology

## 2017-12-10 ENCOUNTER — Ambulatory Visit
Admission: RE | Admit: 2017-12-10 | Discharge: 2017-12-10 | Disposition: A | Discharge status: Home / Self Care | Payer: Medicare Other | Source: Ambulatory Visit | Attending: Urology | Admitting: Urology

## 2017-12-10 DIAGNOSIS — K4090 Unilateral inguinal hernia, without obstruction or gangrene, not specified as recurrent: Secondary | ICD-10-CM

## 2017-12-10 DIAGNOSIS — N433 Hydrocele, unspecified: Secondary | ICD-10-CM

## 2017-12-10 DIAGNOSIS — Z96643 Presence of artificial hip joint, bilateral: Secondary | ICD-10-CM | POA: Diagnosis not present

## 2017-12-10 HISTORY — DX: Systemic involvement of connective tissue, unspecified: M35.9

## 2017-12-10 MED ORDER — IOPAMIDOL (ISOVUE-300) INJECTION 61%
80.0000 mL | Freq: Once | INTRAVENOUS | Status: AC | PRN
  Administered 2017-12-10: 80 mL via INTRAVENOUS

## 2017-12-18 ENCOUNTER — Ambulatory Visit (INDEPENDENT_AMBULATORY_CARE_PROVIDER_SITE_OTHER): Payer: Medicare Other | Admitting: Urology

## 2017-12-18 ENCOUNTER — Encounter: Payer: Self-pay | Admitting: Urology

## 2017-12-18 VITALS — BP 145/85 | Ht 66.0 in | Wt 131.8 lb

## 2017-12-18 DIAGNOSIS — K4090 Unilateral inguinal hernia, without obstruction or gangrene, not specified as recurrent: Secondary | ICD-10-CM

## 2017-12-18 DIAGNOSIS — N5082 Scrotal pain: Secondary | ICD-10-CM

## 2017-12-18 DIAGNOSIS — N433 Hydrocele, unspecified: Secondary | ICD-10-CM | POA: Diagnosis not present

## 2017-12-18 NOTE — Progress Notes (Signed)
12/18/2017 10:45 PM   Roy Trujillo [birth year] [id number]  Referring provider: Center, [facility] [address] [place], [place] [address]  Chief Complaint  Patient presents with  . Follow-up    HPI: Patient is a 52 year old Caucasian male with a history of HIV who was referred from [facility] ED for hydrocele who presents today to discuss his CT report.    Background history He presented to the ED on November 14, 2017 with a complaint of testicular pain.  Scrotal ultrasound on 11/14/2017 revealed negative for testicular torsion or testicular mass.  Bilateral hydroceles, moderate on the right and small on the left  Labs in the ED:  Chlamydia/gonorrhea PCR were negative,  UA was positive for 0-5 WBC's, serum creatinine 0.91 and WBC count was 4.0.    Meds given in the ED: ibuprofen po and oxycodone po Prior urological history:  None.    Current NSAID/anticoagulation:   ibuprofen prn  At his visit on 11/19/2017, he stated that 2 weeks ago and his scrotum was swollen and hard.  He has noted that it is been improving over the last few days, but he is still concerned.  He denied any trauma to the scrotal area, a history of infection to the scrotal or testicular area and any surgery in the scrotal area.  He is having urinary frequency, nocturia and intermittency.   At his visit on 11/27/2017, he stated that everything was relatively stable until 2 days ago when the pain awoke him from sleep.  He states the pain was mostly in the right spermatic cord.  It was sharp and lasted for several minutes.  He states it was so painful it brought tears to his eyes.  Patient denies any gross hematuria, dysuria or suprapubic/flank pain.  Patient denies any fevers, chills, nausea or vomiting.   Contrast CT of the pelvis on 12/10/2017 revealed a right groin hernia contains only fat.  Small volume fluid noted in each hemiscrotum.  Bilateral hip replacement  Today, he is experiencing  baseline nocturia.  He states he is doing better, but he wants to have surgery to correct the hydrocele as it is still bothersome.  Patient denies any gross hematuria, dysuria or suprapubic/flank pain.  Patient denies any fevers, chills, nausea or vomiting.    PMH: Past Medical History:  Diagnosis Date  . Arthritis of hand   . Avascular necrosis (HCC)   . Collagen vascular disease (HCC)    RA  . Depression   . GERD (gastroesophageal reflux disease)   . HIV (human immunodeficiency virus infection) [facility])     Surgical History: Past Surgical History:  Procedure Laterality Date  . CHOLECYSTECTOMY  2013  . HERNIA REPAIR  1974   double inguinal  . JOINT REPLACEMENT Left    left hip  . TONSILLECTOMY     as a child  . TOTAL HIP ARTHROPLASTY Right 05/15/2016   Procedure: TOTAL HIP ARTHROPLASTY;  Surgeon: Donato Heinz, MD;  Location: [facility] ORS;  Service: Orthopedics;  Laterality: Right;    Home Medications:  Allergies as of 12/18/2017      Reactions   Penicillins Other (See Comments)   Unknown childhood reaction Has patient had a PCN reaction causing immediate rash, facial/tongue/throat swelling, SOB or lightheadedness with hypotension: Unknown Has patient had a PCN reaction causing severe rash involving mucus membranes or skin necrosis: Unknown Has patient had a PCN reaction that required hospitalization: Unknown Has patient had a PCN reaction occurring within the last  10 years: Unknown If all of the above answers are "NO", then may proceed with Cephalosporin use.   Bactrim [sulfamethoxazole-trimethoprim] Rash   Zerit [stavudine] Rash      Medication List        Accurate as of 12/18/17 11:59 PM. Always use your most recent med list.          DESCOVY 200-25 MG tablet Generic drug:  emtricitabine-tenofovir AF Take 1 tablet by mouth daily.   mirtazapine 15 MG tablet Commonly known as:  REMERON Take 15 mg by mouth at bedtime.   promethazine 25 MG tablet Commonly known  as:  PHENERGAN Take 1 tablet (25 mg total) by mouth every 4 (four) hours as needed for nausea or vomiting.   TIVICAY 50 MG tablet Generic drug:  dolutegravir Take 1 tablet by mouth 2 (two) times daily.       Allergies:  Allergies  Allergen Reactions  . Penicillins Other (See Comments)    Unknown childhood reaction Has patient had a PCN reaction causing immediate rash, facial/tongue/throat swelling, SOB or lightheadedness with hypotension: Unknown Has patient had a PCN reaction causing severe rash involving mucus membranes or skin necrosis: Unknown Has patient had a PCN reaction that required hospitalization: Unknown Has patient had a PCN reaction occurring within the last 10 years: Unknown If all of the above answers are "NO", then may proceed with Cephalosporin use.   . Bactrim [Sulfamethoxazole-Trimethoprim] Rash  . Zerit [Stavudine] Rash    Family History: No family history on file.  Social History:  reports that he has never smoked. He has never used smokeless tobacco. He reports that he has current or past drug history. Drug: Marijuana. He reports that he does not drink alcohol.  ROS: UROLOGY Frequent Urination?: No Hard to postpone urination?: No Burning/pain with urination?: No Get up at night to urinate?: Yes Leakage of urine?: No Urine stream starts and stops?: No Trouble starting stream?: No Do you have to strain to urinate?: No Blood in urine?: No Urinary tract infection?: No Sexually transmitted disease?: No Injury to kidneys or bladder?: No Painful intercourse?: No Weak stream?: No Erection problems?: No  Gastrointestinal Nausea?: No Vomiting?: No Indigestion/heartburn?: No Diarrhea?: No Constipation?: No  Constitutional Fever: No Night sweats?: No Weight loss?: No Fatigue?: No  Skin Skin rash/lesions?: No Itching?: No  Eyes Blurred vision?: No Double vision?: No  Ears/Nose/Throat Sinus problems?: No  Hematologic/Lymphatic Swollen  glands?: No Easy bruising?: No  Cardiovascular Leg swelling?: No Chest pain?: No  Respiratory Cough?: No  Endocrine Excessive thirst?: No  Musculoskeletal Back pain?: No Joint pain?: No  Neurological Headaches?: No Dizziness?: No  Psychologic Depression?: Yes Anxiety?: Yes  Physical Exam: BP (!) 145/85 (BP Location: Left Arm, Patient Position: Sitting, Cuff Size: Normal)   Ht 5\' 6"  (1.676 m)   Wt 131 lb 12.8 oz (59.8 kg)   BMI 21.27 kg/m   Constitutional: Well nourished. Alert and oriented, No acute distress. HEENT: [place] AT, moist mucus membranes. Trachea midline, no masses. Cardiovascular: No clubbing, cyanosis, or edema. Respiratory: Normal respiratory effort, no increased work of breathing. Skin: No rashes, bruises or suspicious lesions. Lymph: No cervical or inguinal adenopathy. Neurologic: Grossly intact, no focal deficits, moving all 4 extremities. Psychiatric: Normal mood and affect.     Laboratory Data: Lab Results  Component Value Date   WBC 4.0 11/14/2017   HGB 13.7 11/14/2017   HCT 38.8 (L) 11/14/2017   MCV 98.1 11/14/2017   PLT 117 (L) 11/14/2017  Lab Results  Component Value Date   CREATININE 0.91 11/14/2017    No results found for: PSA  No results found for: TESTOSTERONE  No results found for: HGBA1C  Lab Results  Component Value Date   TSH 0.809 02/09/2017    No results found for: CHOL, HDL, CHOLHDL, VLDL, LDLCALC  Lab Results  Component Value Date   AST 75 (H) 11/14/2017   Lab Results  Component Value Date   ALT 129 (H) 11/14/2017   No components found for: ALKALINEPHOPHATASE No components found for: BILIRUBINTOTAL  No results found for: ESTRADIOL  Urinalysis    Component Value Date/Time   COLORURINE YELLOW (A) 11/14/2017 1643   APPEARANCEUR CLEAR (A) 11/14/2017 1643   LABSPEC 1.011 11/14/2017 1643   PHURINE 7.0 11/14/2017 1643   GLUCOSEU NEGATIVE 11/14/2017 1643   HGBUR NEGATIVE 11/14/2017 1643   BILIRUBINUR  NEGATIVE 11/14/2017 1643   KETONESUR NEGATIVE 11/14/2017 1643   PROTEINUR NEGATIVE 11/14/2017 1643   NITRITE NEGATIVE 11/14/2017 1643   LEUKOCYTESUR NEGATIVE 11/14/2017 1643   I have reviewed the labs.   Pertinent Imaging: CLINICAL DATA:  Hydrocele. Pain in the region of the right spermatic cord.  EXAM: CT PELVIS WITH CONTRAST  TECHNIQUE: Multidetector CT imaging of the pelvis was performed using the standard protocol following the bolus administration of intravenous contrast.  CONTRAST:  32mL ISOVUE-300 IOPAMIDOL (ISOVUE-300) INJECTION 61%  COMPARISON:  04/21/2017  FINDINGS: Urinary Tract: Bladder is nondistended but largely obscured by beam hardening artifact from bilateral hip replacement.  Bowel:  No bowel dilatation within the visualized pelvis.  Vascular/Lymphatic: Major aortic in venous anatomy of the pelvis appears patent. No pelvic lymphadenopathy  Reproductive:  No substantial prostatic enlargement.  Other:  No intraperitoneal free fluid.  Musculoskeletal: A right groin hernia contains only fat. Fluid is identified in each hemiscrotum. Patient is status post bilateral hip replacement. No worrisome lytic or sclerotic osseous abnormality.  IMPRESSION: 1. Right groin hernia contains only fat. 2. Small volume fluid noted in each hemiscrotum. 3. Bilateral hip replacement.   Electronically Signed   By: Kennith Center M.D.   On: 12/10/2017 11:31 I have independently reviewed the films and note a right groin hernia sac.    Assessment & Plan:    1. Right groin hernia Will refer to general surgery for further evaluation and get their opinion on the right groin hernia causing his pain and if hernia repair is warranted  2. Bilateral hydroceles Await general surgery evaluation - may need to address the hydroceles concurrently with hernia repair Patient is advised that if he should start to experience a worsening in the pain, increased swelling,  erythema and/or change in the coloring of the scrotum, nausea and/or vomiting and/or fevers greater than 103 or shaking chills to contact the office immediately or seek treatment in the emergency department for emergent intervention.    3. Scrotal pain Patient has a prescription for Vicodin from last visit    Return for refer to general surgery.  These notes generated with voice recognition software. I apologize for typographical errors.  Michiel Cowboy, PA-C  [facility] [address]  [address] [place], [place] [address] [phone]

## 2017-12-30 ENCOUNTER — Telehealth: Payer: Self-pay | Admitting: Urology

## 2017-12-30 NOTE — Telephone Encounter (Signed)
Would you check on the status of his general surgery referral?

## 2017-12-31 NOTE — Telephone Encounter (Signed)
He has an app on 01-05-18   [name]

## 2018-01-05 ENCOUNTER — Ambulatory Visit (INDEPENDENT_AMBULATORY_CARE_PROVIDER_SITE_OTHER): Payer: Medicare Other | Admitting: Surgery

## 2018-01-05 ENCOUNTER — Encounter: Payer: Self-pay | Admitting: Surgery

## 2018-01-05 VITALS — BP 118/76 | HR 59 | Temp 97.7°F | Ht 66.0 in | Wt 138.0 lb

## 2018-01-05 DIAGNOSIS — K4091 Unilateral inguinal hernia, without obstruction or gangrene, recurrent: Secondary | ICD-10-CM

## 2018-01-05 NOTE — Patient Instructions (Signed)

## 2018-01-05 NOTE — H&P (View-Only) (Signed)
Patient ID: Roy Trujillo, male   DOB: [birth year], 51 y.o.   MRN: [id number]  HPI Roy Trujillo is a 52 y.o. male seen in consultation at the request of Mrs. McGowan PAC asymptomatic right inguinal hernia.  Patient reports that he has been having pain and bulge in the right side for about 4 months.  He reports that the pain is intermittent, moderate intensity and sharp in nature.  Pain gets worse with Valsalva.  Did have a history of bilateral inguinal hernia repair as a child when he was around 89 years old. Does have a history of HIV for more than 20 years and his last CD4 was 139.  He also has a history of low CD4 count.  I have personally reviewed the previous notes from Dr. Sampson Goon who is an ID specialist and has been following him up for his HIV.  He is compliant with his antiretroviral medication. No recent hospitalization and no recent infectious complications. He is Able to perform more than 4 METS of activity without any shortness of breath chest pain.  He did have bilateral hip replacement without any wound complications or complications related to the prosthesis. Have a CT scan of the abdomen and pelvis that I have personally reviewed as well as an ultrasound showing evidence of bilateral hydrocele and a right inguinal hernia.  There is no evidence of incarceration.  No other acute intra-abdominal pathology. His white count was 4 and his hemoglobin is 13.9 platelets of 117.  Specifically he  denies any bleeding issues. Creat .90.  HPI  Past Medical History:  Diagnosis Date  . Arthritis of hand   . Avascular necrosis (HCC)   . Collagen vascular disease (HCC)    RA  . Depression   . GERD (gastroesophageal reflux disease)   . HIV (human immunodeficiency virus infection) (HCC)     Past Surgical History:  Procedure Laterality Date  . CHOLECYSTECTOMY  2013  . HERNIA REPAIR  1974   double inguinal  . JOINT REPLACEMENT Left    left hip  . TONSILLECTOMY     as a child   . TOTAL HIP ARTHROPLASTY Right 05/15/2016   Procedure: TOTAL HIP ARTHROPLASTY;  Surgeon: Donato Heinz, MD;  Location: [facility] ORS;  Service: Orthopedics;  Laterality: Right;    No family history on file.  Social History Social History   Tobacco Use  . Smoking status: Never Smoker  . Smokeless tobacco: Never Used  Substance Use Topics  . Alcohol use: No    Comment: Previous Alcohol Dependence  . Drug use: Yes    Types: Marijuana    Comment: Prescription Drugs    Allergies  Allergen Reactions  . Penicillins Other (See Comments)    Unknown childhood reaction Has patient had a PCN reaction causing immediate rash, facial/tongue/throat swelling, SOB or lightheadedness with hypotension: Unknown Has patient had a PCN reaction causing severe rash involving mucus membranes or skin necrosis: Unknown Has patient had a PCN reaction that required hospitalization: Unknown Has patient had a PCN reaction occurring within the last 10 years: Unknown If all of the above answers are "NO", then may proceed with Cephalosporin use.   . Bactrim [Sulfamethoxazole-Trimethoprim] Rash  . Zerit [Stavudine] Rash    Current Outpatient Medications  Medication Sig Dispense Refill  . DESCOVY 200-25 MG tablet Take 1 tablet by mouth daily.  1  . dolutegravir (TIVICAY) 50 MG tablet Take 1 tablet by mouth 2 (two) times daily.    Marland Kitchen gabapentin (  NEURONTIN) 300 MG capsule TK ONE C PO TID P  3  . mirtazapine (REMERON) 15 MG tablet Take 15 mg by mouth at bedtime.    . promethazine (PHENERGAN) 25 MG tablet Take 1 tablet (25 mg total) by mouth every 4 (four) hours as needed for nausea or vomiting. 30 tablet 1   No current facility-administered medications for this visit.      Review of Systems Full ROS  was asked and was negative except for the information on the HPI  Physical Exam Blood pressure 118/76, pulse (!) 59, temperature 97.7 F (36.5 C), temperature source Skin, height 5\' 6"  (1.676 m), weight 138 lb  (62.6 kg). CONSTITUTIONAL: NAD EYES: Pupils are equal, round, and reactive to light, Sclera are non-icteric. EARS, NOSE, MOUTH AND THROAT: The oropharynx is clear. The oral mucosa is pink and moist. Hearing is intact to voice. LYMPH NODES:  Lymph nodes in the neck are normal. RESPIRATORY:  Lungs are clear. There is normal respiratory effort, with equal breath sounds bilaterally, and without pathologic use of accessory muscles. CARDIOVASCULAR: Heart is regular without murmurs, gallops, or rubs. GI: The abdomen is  soft, educible right inguinal hernia.  No peritonitis.  Evidence of previous right upper quadrant subcostal scar  GU: Lateral hydrocele right greater than left.  Tender testicles bilaterally, no masses MUSCULOSKELETAL: Normal muscle strength and tone. No cyanosis or edema.   SKIN: Turgor is good and there are no pathologic skin lesions or ulcers. NEUROLOGIC: Motor and sensation is grossly normal. Cranial nerves are grossly intact. PSYCH:  Oriented to person, place and time. Affect is normal.  Data Reviewed  I have personally reviewed the patient's imaging, laboratory findings and medical records.    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 Do think from an ID perspective he is as good as he will ever be he has some chronic low CD4 and the last one of 139 has been as good as he is ever been in several years. Does not have any modifiable risk factors at this time and I do recommend elective robotic inguinal hernia repair.  Procedure discussed with the patient in detail.  Risk benefit and possible complications including but not limited to: Bleeding, infection, bowel injury, recurrence, chronic pain.  He understands and wishes to proceed Copy of this report was sent to the referring provider   , MD FACS General Surgeon 01/05/2018, 11:54 AM

## 2018-01-05 NOTE — Progress Notes (Signed)
Patient ID: Roy Trujillo, male   DOB: 06/05/1965, 52 y.o.   MRN: [id number]  HPI Roy Trujillo is a 52 y.o. male seen in consultation at the request of Mrs. McGowan PAC asymptomatic right inguinal hernia.  Patient reports that he has been having pain and bulge in the right side for about 4 months.  He reports that the pain is intermittent, moderate intensity and sharp in nature.  Pain gets worse with Valsalva.  Did have a history of bilateral inguinal hernia repair as a child when he was around 7 years old. Does have a history of HIV for more than 20 years and his last CD4 was 139.  He also has a history of low CD4 count.  I have personally reviewed the previous notes from Dr. Fitzgerald who is an ID specialist and has been following him up for his HIV.  He is compliant with his antiretroviral medication. No recent hospitalization and no recent infectious complications. He is Able to perform more than 4 METS of activity without any shortness of breath chest pain.  He did have bilateral hip replacement without any wound complications or complications related to the prosthesis. Have a CT scan of the abdomen and pelvis that I have personally reviewed as well as an ultrasound showing evidence of bilateral hydrocele and a right inguinal hernia.  There is no evidence of incarceration.  No other acute intra-abdominal pathology. His white count was 4 and his hemoglobin is 13.9 platelets of 117.  Specifically he  denies any bleeding issues. Creat .90.  HPI  Past Medical History:  Diagnosis Date  . Arthritis of hand   . Avascular necrosis (HCC)   . Collagen vascular disease (HCC)    RA  . Depression   . GERD (gastroesophageal reflux disease)   . HIV (human immunodeficiency virus infection) (HCC)     Past Surgical History:  Procedure Laterality Date  . CHOLECYSTECTOMY  2013  . HERNIA REPAIR  1974   double inguinal  . JOINT REPLACEMENT Left    left hip  . TONSILLECTOMY     as a child   . TOTAL HIP ARTHROPLASTY Right 05/15/2016   Procedure: TOTAL HIP ARTHROPLASTY;  Surgeon: James P Hooten, MD;  Location: [facility] ORS;  Service: Orthopedics;  Laterality: Right;    No family history on file.  Social History Social History   Tobacco Use  . Smoking status: Never Smoker  . Smokeless tobacco: Never Used  Substance Use Topics  . Alcohol use: No    Comment: Previous Alcohol Dependence  . Drug use: Yes    Types: Marijuana    Comment: Prescription Drugs    Allergies  Allergen Reactions  . Penicillins Other (See Comments)    Unknown childhood reaction Has patient had a PCN reaction causing immediate rash, facial/tongue/throat swelling, SOB or lightheadedness with hypotension: Unknown Has patient had a PCN reaction causing severe rash involving mucus membranes or skin necrosis: Unknown Has patient had a PCN reaction that required hospitalization: Unknown Has patient had a PCN reaction occurring within the last 10 years: Unknown If all of the above answers are "NO", then may proceed with Cephalosporin use.   . Bactrim [Sulfamethoxazole-Trimethoprim] Rash  . Zerit [Stavudine] Rash    Current Outpatient Medications  Medication Sig Dispense Refill  . DESCOVY 200-25 MG tablet Take 1 tablet by mouth daily.  1  . dolutegravir (TIVICAY) 50 MG tablet Take 1 tablet by mouth 2 (two) times daily.    . gabapentin (  NEURONTIN) 300 MG capsule TK ONE C PO TID P  3  . mirtazapine (REMERON) 15 MG tablet Take 15 mg by mouth at bedtime.    . promethazine (PHENERGAN) 25 MG tablet Take 1 tablet (25 mg total) by mouth every 4 (four) hours as needed for nausea or vomiting. 30 tablet 1   No current facility-administered medications for this visit.      Review of Systems Full ROS  was asked and was negative except for the information on the HPI  Physical Exam Blood pressure 118/76, pulse (!) 59, temperature 97.7 F (36.5 C), temperature source Skin, height 5\' 6"  (1.676 m), weight 138 lb  (62.6 kg). CONSTITUTIONAL: NAD EYES: Pupils are equal, round, and reactive to light, Sclera are non-icteric. EARS, NOSE, MOUTH AND THROAT: The oropharynx is clear. The oral mucosa is pink and moist. Hearing is intact to voice. LYMPH NODES:  Lymph nodes in the neck are normal. RESPIRATORY:  Lungs are clear. There is normal respiratory effort, with equal breath sounds bilaterally, and without pathologic use of accessory muscles. CARDIOVASCULAR: Heart is regular without murmurs, gallops, or rubs. GI: The abdomen is  soft, educible right inguinal hernia.  No peritonitis.  Evidence of previous right upper quadrant subcostal scar  GU: Lateral hydrocele right greater than left.  Tender testicles bilaterally, no masses MUSCULOSKELETAL: Normal muscle strength and tone. No cyanosis or edema.   SKIN: Turgor is good and there are no pathologic skin lesions or ulcers. NEUROLOGIC: Motor and sensation is grossly normal. Cranial nerves are grossly intact. PSYCH:  Oriented to person, place and time. Affect is normal.  Data Reviewed  I have personally reviewed the patient's imaging, laboratory findings and medical records.    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 Do think from an ID perspective he is as good as he will ever be he has some chronic low CD4 and the last one of 139 has been as good as he is ever been in several years. Does not have any modifiable risk factors at this time and I do recommend elective robotic inguinal hernia repair.  Procedure discussed with the patient in detail.  Risk benefit and possible complications including but not limited to: Bleeding, infection, bowel injury, recurrence, chronic pain.  He understands and wishes to proceed Copy of this report was sent to the referring provider   , MD FACS General Surgeon 01/05/2018, 11:54 AM

## 2018-01-07 ENCOUNTER — Encounter: Payer: Self-pay | Admitting: *Deleted

## 2018-01-07 NOTE — Progress Notes (Signed)
Patient's surgery has been scheduled for 01-15-18 at [facility] with Dr. Pabon.  The patient is aware to call the office should they have further questions.   

## 2018-01-12 ENCOUNTER — Encounter
Admission: RE | Admit: 2018-01-12 | Discharge: 2018-01-12 | Disposition: A | Discharge status: Home / Self Care | Payer: Medicare Other | Source: Ambulatory Visit | Attending: Surgery | Admitting: Surgery

## 2018-01-12 ENCOUNTER — Other Ambulatory Visit: Payer: Self-pay

## 2018-01-12 HISTORY — DX: Anxiety disorder, unspecified: F41.9

## 2018-01-12 NOTE — Patient Instructions (Signed)
Your procedure is scheduled on: 01-15-18 THURSDAY Report to Same Day Surgery 2nd floor medical mall [facility] Entrance-take elevator on left to 2nd floor.  Check in with surgery information desk.) To find out your arrival time please call [phone] between 1PM - 3PM on 01-14-18 [facility]  Remember: Instructions that are not followed completely may result in serious medical risk, up to and including death, or upon the discretion of your surgeon and anesthesiologist your surgery may need to be rescheduled.    _x___ 1. Do not eat food after midnight the night before your procedure. NO GUM OR CANDY AFTER MIDNIGHT. You may drink clear liquids up to 2 hours before you are scheduled to arrive at the hospital for your procedure.  Do not drink clear liquids within 2 hours of your scheduled arrival to the hospital.  Clear liquids include  --Water or Apple juice without pulp  --Clear carbohydrate beverage such as ClearFast or Gatorade  --Black Coffee or Clear Tea (No milk, no creamers, do not add anything to  the coffee or Tea   ____Ensure clear carbohydrate drink on the way to the hospital for bariatric patients  ____Ensure clear carbohydrate drink 3 hours before surgery for Dr Rutherford Nail patients if physician instructed.     __x__ 2. No Alcohol for 24 hours before or after surgery.   __x__3. No Smoking or e-cigarettes for 24 prior to surgery.  Do not use any chewable tobacco products for at least 6 hour prior to surgery   ____  4. Bring all medications with you on the day of surgery if instructed.    __x__ 5. Notify your doctor if there is any change in your medical condition     (cold, fever, infections).    x___6. On the morning of surgery brush your teeth with toothpaste and water.  You may rinse your mouth with mouth wash if you wish.  Do not swallow any toothpaste or mouthwash.   Do not wear jewelry, make-up, hairpins, clips or nail polish.  Do not wear lotions, powders, or  perfumes. You may wear deodorant.  Do not shave 48 hours prior to surgery. Men may shave face and neck.  Do not bring valuables to the hospital.    [facility] is not responsible for any belongings or valuables.               Contacts, dentures or bridgework may not be worn into surgery.  Leave your suitcase in the car. After surgery it may be brought to your room.  For patients admitted to the hospital, discharge time is determined by your treatment team.  _  Patients discharged the day of surgery will not be allowed to drive home.  You will need someone to drive you home and stay with you the night of your procedure.    Please read over the following fact sheets that you were given:   [facility] Preparing for Surgery  _x___ TAKE THE FOLLOWING MEDICATION THE MORNING OF SURGERY WITH A SMALL SIP OF WATER. These include:  1. TICAVAY  2. GABAPENTIN  3.  4.  5.  6.  ____Fleets enema or Magnesium Citrate as directed.   _x___ Use CHG Soap or sage wipes as directed on instruction sheet   ____ Use inhalers on the day of surgery and bring to hospital day of surgery  ____ Stop Metformin and Janumet 2 days prior to surgery.    ____ Take 1/2 of usual insulin dose the night before  surgery and none on the morning surgery.   ____ Follow recommendations from Cardiologist, Pulmonologist or PCP regarding stopping Aspirin, Coumadin, Plavix ,Eliquis, Effient, or Pradaxa, and Pletal.  X____Stop Anti-inflammatories such as Advil, Aleve, Ibuprofen, Motrin, Naproxen, Naprosyn, Goodies powders or aspirin products NOW-OK to take Tylenol    ____ Stop supplements until after surgery.     ____ Bring C-Pap to the hospital.

## 2018-01-14 ENCOUNTER — Encounter
Admission: RE | Admit: 2018-01-14 | Discharge: 2018-01-14 | Disposition: A | Discharge status: Home / Self Care | Payer: Medicare Other | Source: Ambulatory Visit | Attending: Surgery | Admitting: Surgery

## 2018-01-14 DIAGNOSIS — Z79899 Other long term (current) drug therapy: Secondary | ICD-10-CM | POA: Diagnosis not present

## 2018-01-14 DIAGNOSIS — Z01812 Encounter for preprocedural laboratory examination: Secondary | ICD-10-CM

## 2018-01-14 DIAGNOSIS — K4091 Unilateral inguinal hernia, without obstruction or gangrene, recurrent: Secondary | ICD-10-CM | POA: Diagnosis not present

## 2018-01-14 DIAGNOSIS — Z21 Asymptomatic human immunodeficiency virus [HIV] infection status: Secondary | ICD-10-CM | POA: Diagnosis not present

## 2018-01-14 DIAGNOSIS — K4090 Unilateral inguinal hernia, without obstruction or gangrene, not specified as recurrent: Secondary | ICD-10-CM | POA: Diagnosis present

## 2018-01-14 DIAGNOSIS — Z96643 Presence of artificial hip joint, bilateral: Secondary | ICD-10-CM | POA: Diagnosis not present

## 2018-01-14 DIAGNOSIS — F329 Major depressive disorder, single episode, unspecified: Secondary | ICD-10-CM | POA: Diagnosis not present

## 2018-01-14 LAB — CBC
HEMATOCRIT: 45.5 % (ref 39.0–52.0)
Hemoglobin: 15.3 g/dL (ref 13.0–17.0)
MCH: 33.8 pg (ref 26.0–34.0)
MCHC: 33.6 g/dL (ref 30.0–36.0)
MCV: 100.7 fL — AB (ref 80.0–100.0)
Platelets: 137 10*3/uL — ABNORMAL LOW (ref 150–400)
RBC: 4.52 MIL/uL (ref 4.22–5.81)
RDW: 13.4 % (ref 11.5–15.5)
WBC: 5.3 10*3/uL (ref 4.0–10.5)
nRBC: 0 % (ref 0.0–0.2)

## 2018-01-14 LAB — HEPATIC FUNCTION PANEL
ALT: 43 U/L (ref 0–44)
AST: 41 U/L (ref 15–41)
Albumin: 4.5 g/dL (ref 3.5–5.0)
Alkaline Phosphatase: 62 U/L (ref 38–126)
BILIRUBIN DIRECT: 0.1 mg/dL (ref 0.0–0.2)
BILIRUBIN INDIRECT: 0.8 mg/dL (ref 0.3–0.9)
BILIRUBIN TOTAL: 0.9 mg/dL (ref 0.3–1.2)
Total Protein: 7.7 g/dL (ref 6.5–8.1)

## 2018-01-14 MED ORDER — CLINDAMYCIN PHOSPHATE 900 MG/50ML IV SOLN
900.0000 mg | INTRAVENOUS | Status: AC
  Administered 2018-01-15: 900 mg via INTRAVENOUS

## 2018-01-15 ENCOUNTER — Encounter
Admission: RE | Disposition: A | Discharge status: Home / Self Care | Payer: Self-pay | Source: Ambulatory Visit | Attending: Surgery

## 2018-01-15 ENCOUNTER — Ambulatory Visit
Admission: RE | Admit: 2018-01-15 | Discharge: 2018-01-15 | Disposition: A | Discharge status: Home / Self Care | Payer: Medicare Other | Source: Ambulatory Visit | Attending: Surgery | Admitting: Surgery

## 2018-01-15 ENCOUNTER — Ambulatory Visit: Payer: Medicare Other | Admitting: Certified Registered"

## 2018-01-15 ENCOUNTER — Encounter: Payer: Self-pay | Admitting: *Deleted

## 2018-01-15 ENCOUNTER — Other Ambulatory Visit: Payer: Self-pay

## 2018-01-15 DIAGNOSIS — Z96643 Presence of artificial hip joint, bilateral: Secondary | ICD-10-CM | POA: Insufficient documentation

## 2018-01-15 DIAGNOSIS — Z21 Asymptomatic human immunodeficiency virus [HIV] infection status: Secondary | ICD-10-CM | POA: Insufficient documentation

## 2018-01-15 DIAGNOSIS — Z79899 Other long term (current) drug therapy: Secondary | ICD-10-CM | POA: Insufficient documentation

## 2018-01-15 DIAGNOSIS — F329 Major depressive disorder, single episode, unspecified: Secondary | ICD-10-CM | POA: Insufficient documentation

## 2018-01-15 DIAGNOSIS — K4091 Unilateral inguinal hernia, without obstruction or gangrene, recurrent: Secondary | ICD-10-CM | POA: Insufficient documentation

## 2018-01-15 HISTORY — PX: ROBOT ASSISTED INGUINAL HERNIA REPAIR: SHX6561

## 2018-01-15 LAB — URINE DRUG SCREEN, QUALITATIVE (ARMC ONLY)
AMPHETAMINES, UR SCREEN: NOT DETECTED
BENZODIAZEPINE, UR SCRN: NOT DETECTED
Barbiturates, Ur Screen: NOT DETECTED
Cannabinoid 50 Ng, Ur ~~LOC~~: POSITIVE — AB
Cocaine Metabolite,Ur ~~LOC~~: NOT DETECTED
MDMA (Ecstasy)Ur Screen: NOT DETECTED
METHADONE SCREEN, URINE: NOT DETECTED
Opiate, Ur Screen: NOT DETECTED
Phencyclidine (PCP) Ur S: NOT DETECTED
TRICYCLIC, UR SCREEN: NOT DETECTED

## 2018-01-15 SURGERY — ROBOT ASSISTED INGUINAL HERNIA REPAIR
Anesthesia: General | Laterality: Right

## 2018-01-15 MED ORDER — PROPOFOL 10 MG/ML IV BOLUS
INTRAVENOUS | Status: DC | PRN
  Administered 2018-01-15: 140 mg via INTRAVENOUS

## 2018-01-15 MED ORDER — SUGAMMADEX SODIUM 200 MG/2ML IV SOLN
INTRAVENOUS | Status: DC | PRN
  Administered 2018-01-15: 200 mg via INTRAVENOUS

## 2018-01-15 MED ORDER — CLINDAMYCIN PHOSPHATE 900 MG/50ML IV SOLN
INTRAVENOUS | Status: AC
  Filled 2018-01-15: qty 50

## 2018-01-15 MED ORDER — LIDOCAINE HCL (PF) 2 % IJ SOLN
INTRAMUSCULAR | Status: AC
  Filled 2018-01-15: qty 10

## 2018-01-15 MED ORDER — CHLORHEXIDINE GLUCONATE CLOTH 2 % EX PADS: 6.0000 | MEDICATED_PAD | Freq: Once | CUTANEOUS | Status: DC

## 2018-01-15 MED ORDER — OXYCODONE HCL 5 MG PO TABS
5.0000 mg | ORAL_TABLET | Freq: Once | ORAL | Status: AC | PRN
  Administered 2018-01-15: 5 mg via ORAL

## 2018-01-15 MED ORDER — ONDANSETRON HCL 4 MG/2ML IJ SOLN
INTRAMUSCULAR | Status: AC
  Filled 2018-01-15: qty 2

## 2018-01-15 MED ORDER — MIDAZOLAM HCL 2 MG/2ML IJ SOLN
INTRAMUSCULAR | Status: AC
  Filled 2018-01-15: qty 2

## 2018-01-15 MED ORDER — LIDOCAINE HCL (CARDIAC) PF 100 MG/5ML IV SOSY
PREFILLED_SYRINGE | INTRAVENOUS | Status: DC | PRN
  Administered 2018-01-15: 80 mg via INTRAVENOUS

## 2018-01-15 MED ORDER — FENTANYL CITRATE (PF) 100 MCG/2ML IJ SOLN
INTRAMUSCULAR | Status: AC
  Administered 2018-01-15: 50 ug via INTRAVENOUS
  Filled 2018-01-15: qty 2

## 2018-01-15 MED ORDER — MEPERIDINE HCL 50 MG/ML IJ SOLN: 6.2500 mg | INTRAMUSCULAR | Status: DC | PRN

## 2018-01-15 MED ORDER — MIDAZOLAM HCL 2 MG/2ML IJ SOLN
INTRAMUSCULAR | Status: DC | PRN
  Administered 2018-01-15: 2 mg via INTRAVENOUS

## 2018-01-15 MED ORDER — OXYCODONE-ACETAMINOPHEN 5-325 MG PO TABS: 1.0000 | ORAL_TABLET | ORAL | 0 refills | Status: DC | PRN

## 2018-01-15 MED ORDER — BUPIVACAINE-EPINEPHRINE (PF) 0.25% -1:200000 IJ SOLN
INTRAMUSCULAR | Status: AC
  Filled 2018-01-15: qty 30

## 2018-01-15 MED ORDER — OXYCODONE HCL 5 MG PO TABS
ORAL_TABLET | ORAL | Status: AC
  Filled 2018-01-15: qty 1

## 2018-01-15 MED ORDER — LACTATED RINGERS IV SOLN
INTRAVENOUS | Status: DC
  Administered 2018-01-15: 08:00:00 via INTRAVENOUS

## 2018-01-15 MED ORDER — FENTANYL CITRATE (PF) 250 MCG/5ML IJ SOLN
INTRAMUSCULAR | Status: AC
  Filled 2018-01-15: qty 5

## 2018-01-15 MED ORDER — OXYCODONE HCL 5 MG/5ML PO SOLN: 5.0000 mg | Freq: Once | ORAL | Status: AC | PRN

## 2018-01-15 MED ORDER — ACETAMINOPHEN 500 MG PO TABS
ORAL_TABLET | ORAL | Status: AC
  Administered 2018-01-15: 1000 mg via ORAL
  Filled 2018-01-15: qty 2

## 2018-01-15 MED ORDER — PROPOFOL 10 MG/ML IV BOLUS
INTRAVENOUS | Status: AC
  Filled 2018-01-15: qty 20

## 2018-01-15 MED ORDER — DEXAMETHASONE SODIUM PHOSPHATE 10 MG/ML IJ SOLN
INTRAMUSCULAR | Status: AC
  Filled 2018-01-15: qty 1

## 2018-01-15 MED ORDER — OXYCODONE HCL 5 MG PO TABS
ORAL_TABLET | ORAL | Status: AC
  Administered 2018-01-15: 5 mg via ORAL
  Filled 2018-01-15: qty 1

## 2018-01-15 MED ORDER — CELECOXIB 200 MG PO CAPS
200.0000 mg | ORAL_CAPSULE | ORAL | Status: AC
  Administered 2018-01-15: 200 mg via ORAL

## 2018-01-15 MED ORDER — ONDANSETRON HCL 4 MG/2ML IJ SOLN
INTRAMUSCULAR | Status: DC | PRN
  Administered 2018-01-15: 4 mg via INTRAVENOUS

## 2018-01-15 MED ORDER — CHLORHEXIDINE GLUCONATE CLOTH 2 % EX PADS
6.0000 | MEDICATED_PAD | Freq: Once | CUTANEOUS | Status: AC
  Administered 2018-01-15: 6 via TOPICAL

## 2018-01-15 MED ORDER — GABAPENTIN 300 MG PO CAPS
ORAL_CAPSULE | ORAL | Status: AC
  Administered 2018-01-15: 300 mg via ORAL
  Filled 2018-01-15: qty 1

## 2018-01-15 MED ORDER — CELECOXIB 200 MG PO CAPS
ORAL_CAPSULE | ORAL | Status: AC
  Administered 2018-01-15: 200 mg via ORAL
  Filled 2018-01-15: qty 1

## 2018-01-15 MED ORDER — FAMOTIDINE 20 MG PO TABS
ORAL_TABLET | ORAL | Status: AC
  Administered 2018-01-15: 20 mg via ORAL
  Filled 2018-01-15: qty 1

## 2018-01-15 MED ORDER — OXYCODONE HCL 5 MG PO TABS
5.0000 mg | ORAL_TABLET | Freq: Once | ORAL | Status: AC
  Administered 2018-01-15: 5 mg via ORAL

## 2018-01-15 MED ORDER — ACETAMINOPHEN 500 MG PO TABS
1000.0000 mg | ORAL_TABLET | ORAL | Status: AC
  Administered 2018-01-15: 1000 mg via ORAL

## 2018-01-15 MED ORDER — PROMETHAZINE HCL 25 MG/ML IJ SOLN: 6.2500 mg | INTRAMUSCULAR | Status: DC | PRN

## 2018-01-15 MED ORDER — GABAPENTIN 300 MG PO CAPS
300.0000 mg | ORAL_CAPSULE | ORAL | Status: AC
  Administered 2018-01-15: 300 mg via ORAL

## 2018-01-15 MED ORDER — FAMOTIDINE 20 MG PO TABS
20.0000 mg | ORAL_TABLET | Freq: Once | ORAL | Status: AC
  Administered 2018-01-15: 20 mg via ORAL

## 2018-01-15 MED ORDER — FENTANYL CITRATE (PF) 100 MCG/2ML IJ SOLN
25.0000 ug | INTRAMUSCULAR | Status: DC | PRN
  Administered 2018-01-15 (×4): 50 ug via INTRAVENOUS

## 2018-01-15 MED ORDER — DEXAMETHASONE SODIUM PHOSPHATE 10 MG/ML IJ SOLN
INTRAMUSCULAR | Status: DC | PRN
  Administered 2018-01-15: 10 mg via INTRAVENOUS

## 2018-01-15 MED ORDER — FENTANYL CITRATE (PF) 100 MCG/2ML IJ SOLN
INTRAMUSCULAR | Status: DC | PRN
  Administered 2018-01-15 (×3): 50 ug via INTRAVENOUS

## 2018-01-15 MED ORDER — ROCURONIUM BROMIDE 100 MG/10ML IV SOLN
INTRAVENOUS | Status: DC | PRN
  Administered 2018-01-15: 50 mg via INTRAVENOUS

## 2018-01-15 MED ORDER — ROCURONIUM BROMIDE 50 MG/5ML IV SOLN
INTRAVENOUS | Status: AC
  Filled 2018-01-15: qty 1

## 2018-01-15 MED ORDER — BUPIVACAINE-EPINEPHRINE 0.25% -1:200000 IJ SOLN
INTRAMUSCULAR | Status: DC | PRN
  Administered 2018-01-15: 30 mL

## 2018-01-15 SURGICAL SUPPLY — 45 items
BLADE SURG 15 STRL SS SAFETY (BLADE) ×3 IMPLANT
CANISTER SUCT 1200ML W/VALVE (MISCELLANEOUS) ×3 IMPLANT
CHLORAPREP W/TINT 26ML (MISCELLANEOUS) ×3 IMPLANT
CORD BIP STRL DISP 12FT (MISCELLANEOUS) ×3 IMPLANT
COVER TIP SHEARS 8 DVNC (MISCELLANEOUS) ×1 IMPLANT
COVER TIP SHEARS 8MM DA VINCI (MISCELLANEOUS) ×2
COVER WAND RF STERILE (DRAPES) ×3 IMPLANT
DEFOGGER SCOPE WARMER CLEARIFY (MISCELLANEOUS) ×3 IMPLANT
DERMABOND ADVANCED (GAUZE/BANDAGES/DRESSINGS) ×2
DERMABOND ADVANCED .7 DNX12 (GAUZE/BANDAGES/DRESSINGS) ×1 IMPLANT
DRAPE 3 ARM ACCESS DA VINCI (DRAPES) ×2
DRAPE 3 ARM ACCESS DVNC (DRAPES) ×1 IMPLANT
DRAPE SHEET LG 3/4 BI-LAMINATE (DRAPES) ×6 IMPLANT
ELECT CAUTERY BLADE 6.4 (BLADE) ×3 IMPLANT
ELECT CAUTERY BLADE TIP 2.5 (TIP) ×3
ELECT REM PT RETURN 9FT ADLT (ELECTROSURGICAL) ×3
ELECTRODE CAUTERY BLDE TIP 2.5 (TIP) ×1 IMPLANT
ELECTRODE REM PT RTRN 9FT ADLT (ELECTROSURGICAL) ×1 IMPLANT
GLOVE BIO SURGEON STRL SZ7 (GLOVE) ×12 IMPLANT
GOWN STRL REUS W/ TWL LRG LVL3 (GOWN DISPOSABLE) ×4 IMPLANT
GOWN STRL REUS W/TWL LRG LVL3 (GOWN DISPOSABLE) ×8
IV NS 1000ML (IV SOLUTION) ×2
IV NS 1000ML BAXH (IV SOLUTION) ×1 IMPLANT
KIT PINK PAD W/HEAD ARE REST (MISCELLANEOUS) ×3
KIT PINK PAD W/HEAD ARM REST (MISCELLANEOUS) ×1 IMPLANT
LABEL OR SOLS (LABEL) ×3 IMPLANT
MESH 3DMAX 4X6 RT LRG (Mesh General) ×3 IMPLANT
NEEDLE HYPO 22GX1.5 SAFETY (NEEDLE) ×3 IMPLANT
PACK LAP CHOLECYSTECTOMY (MISCELLANEOUS) ×3 IMPLANT
PENCIL ELECTRO HAND CTR (MISCELLANEOUS) ×3 IMPLANT
PROGRASP ENDOWRIST DA VINCI (INSTRUMENTS) ×2
PROGRASP ENDOWRIST DVNC (INSTRUMENTS) ×1 IMPLANT
SOLUTION ELECTROLUBE (MISCELLANEOUS) ×3 IMPLANT
SPONGE LAP 18X18 RF (DISPOSABLE) ×3 IMPLANT
STRAP SAFETY 5IN WIDE (MISCELLANEOUS) ×3 IMPLANT
SUT MNCRL AB 4-0 PS2 18 (SUTURE) ×3 IMPLANT
SUT VIC AB 2-0 SH 27 (SUTURE) ×4
SUT VIC AB 2-0 SH 27XBRD (SUTURE) ×2 IMPLANT
SUT VIC AB 3-0 SH 27 (SUTURE) ×2
SUT VIC AB 3-0 SH 27X BRD (SUTURE) ×1 IMPLANT
SUT VICRYL 0 AB UR-6 (SUTURE) ×6 IMPLANT
SUT VLOC 90 S/L VL9 GS22 (SUTURE) ×3 IMPLANT
SYR 20CC LL (SYRINGE) ×3 IMPLANT
TROCAR XCEL BLUNT TIP 100MML (ENDOMECHANICALS) ×3 IMPLANT
TUBING INSUF HEATED (TUBING) ×3 IMPLANT

## 2018-01-15 NOTE — Interval H&P Note (Signed)
History and Physical Interval Note:  01/15/2018 8:27 AM  Roy Trujillo  has presented today for surgery, with the diagnosis of INGUINAL HERNIA  The various methods of treatment have been discussed with the patient and family. After consideration of risks, benefits and other options for treatment, the patient has consented to  Procedure(s): ROBOT ASSISTED INGUINAL HERNIA REPAIR (Right) as a surgical intervention .  The patient's history has been reviewed, patient examined, no change in status, stable for surgery.  I have reviewed the patient's chart and labs.  Questions were answered to the patient's satisfaction.     Diego F Pabon

## 2018-01-15 NOTE — Anesthesia Post-op Follow-up Note (Signed)
Anesthesia QCDR form completed.        

## 2018-01-15 NOTE — Op Note (Addendum)
LaparoscopicBilateral Inguinal Hernia Repair  Roy Trujillo  01/15/2018  Pre-operative Diagnosis: Recurrent Right Inguinal Hernia  Post-operative Diagnosis: Same  Procedure: Robotic assisted Laparoscopic transabdominal repair of Right inguinal hernia using large 3D mesh  Surgeon: Sterling Big, MD FACS  Anesthesia: Gen. with endotracheal tube   Findings: Right indirect Inguinal Hernia     Procedure Details  The patient was seen again in the Holding Room. The benefits, complications, treatment options, and expected outcomes were discussed with the patient. The risks of bleeding, infection, recurrence of symptoms, failure to resolve symptoms, recurrence of hernia, ischemic orchitis, chronic pain syndrome or neuroma, were discussed again. The likelihood of improving the patient's symptoms with return to their baseline status is good.  The patient and/or family concurred with the proposed plan, giving informed consent.  The patient was taken to Operating Room, identified as Roy Trujillo and the procedure verified as Laparoscopic Inguinal Hernia Repair. Laterality confirmed.  A Time Out was held and the above information confirmed.  Prior to the induction of general anesthesia, antibiotic prophylaxis was administered. VTE prophylaxis was in place. General endotracheal anesthesia was then administered and tolerated well. After the induction, the abdomen was prepped with Chloraprep and draped in the sterile fashion. The patient was positioned in the supine position.  Umbilical incision was created and a cutdown technique was used.  The fascia was elevated and incised in the standard fashion.  The abdominal cavity was entered under direct visualization.  2 stay sutures were placed on each side of the fascia and a Hassan trocar was inserted. Pneumoperitoneum was established without evidence of hemodynamic changes.  Patient was placed in the Trendelenburg position and 2 8 mm ports were  placed on each side under direct visualization. Scopic revealed evidence of an indirect right inguinal hernia no evidence of left hernias.  At this time I went ahead and inserted the large 3D mesh as well as the V lock suture.  Robot was brought to the surgical field and docked in the standard fashion.  I then scrubbed out and went to the console.  The peritoneal flap was created in the standard fashion with scissors.  We went ahead and dissected the core from adjacent structures and were able to reduce the sac circumferentially.  This was a chronic hernia with significant scar tissue.  There was a piece of omentum with her the hernia sac.  We were able to visualize the cord structures as well as the vas deferens and major vessels and protect them at all times.  Once we have adequate reduction of the hernia sac we identified the structures including Cooper's ligament and symphysis pubis.  The 3D mesh was placed to cover the direct, indirect and femoral spaces.  I was very happy with the position of the mesh there was no evidence of any wrinkles.  The mesh was secured immediately to the pubic tubercle with 2 interrupted Vicryl sutures. The peritoneal flap was closed with a running V lock suture in the standard fashion. Very happy with the repair and there was good hemostasis at the end of the procedure.   The robot was un docked all the instruments sutures and needles were removed in the standard fashion.  I scrubbed back in and remove the robotic ports and deflated the pneumoperitoneum. A figure-of-eight 0 Vicryl suture was placed at the fascial edges. 4-0 subcuticular Monocryl was used at all skin edges. Dermabond was placed  Patient tolerated the procedure well. There were no complications. He  was taken to the recovery room in stable condition.               Caroleen Hamman, MD, FACS

## 2018-01-15 NOTE — Transfer of Care (Signed)
Immediate Anesthesia Transfer of Care Note  Patient: Roy Trujillo  Procedure(s) Performed: ROBOT ASSISTED INGUINAL HERNIA REPAIR (Right )  Patient Location: PACU  Anesthesia Type:General  Level of Consciousness: awake, alert  and oriented  Airway & Oxygen Therapy: Patient Spontanous Breathing and Patient connected to face mask oxygen  Post-op Assessment: Report given to RN and Post -op Vital signs reviewed and stable  Post vital signs: Reviewed and stable  Last Vitals:  Vitals Value Taken Time  BP    Temp    Pulse 73 01/15/2018 10:12 AM  Resp 12 01/15/2018 10:12 AM  SpO2 100 % 01/15/2018 10:12 AM  Vitals shown include unvalidated device data.  Last Pain:  Vitals:   01/15/18 0718  TempSrc: Oral  PainSc: 5       Patients Stated Pain Goal: 3 (01/15/18 5465)  Complications: No apparent anesthesia complications

## 2018-01-15 NOTE — Discharge Instructions (Signed)

## 2018-01-15 NOTE — Anesthesia Preprocedure Evaluation (Signed)
Anesthesia Evaluation  Patient identified by MRN, date of birth, ID band Patient awake    Reviewed: Allergy & Precautions, NPO status , Patient's Chart, lab work & pertinent test results  History of Anesthesia Complications Negative for: history of anesthetic complications  Airway Mallampati: II  TM Distance: >3 FB Neck ROM: Full    Dental  (+) Poor Dentition, Missing   Pulmonary neg pulmonary ROS, neg sleep apnea, neg COPD,    breath sounds clear to auscultation- rhonchi (-) wheezing      Cardiovascular Exercise Tolerance: Good (-) hypertension(-) CAD and (-) Past MI  Rhythm:Regular Rate:Normal - Systolic murmurs and - Diastolic murmurs    Neuro/Psych PSYCHIATRIC DISORDERS Anxiety Depression    GI/Hepatic Neg liver ROS, GERD  ,  Endo/Other  negative endocrine ROSneg diabetes  Renal/GU negative Renal ROS     Musculoskeletal  (+) Arthritis ,   Abdominal (+) - obese,   Peds  Hematology negative hematology ROS (+)   Anesthesia Other Findings Past Medical History: No date: Avascular necrosis (HCC) No date: Depression No date: GERD (gastroesophageal reflux disease) No date: HIV (human immunodeficiency virus infection) (*   Reproductive/Obstetrics                             Anesthesia Physical Anesthesia Plan  ASA: II  Anesthesia Plan: General   Post-op Pain Management:    Induction: Intravenous  PONV Risk Score and Plan: 1 and Ondansetron and Midazolam  Airway Management Planned: Oral ETT  Additional Equipment:   Intra-op Plan:   Post-operative Plan: Extubation in OR  Informed Consent: I have reviewed the patients History and Physical, chart, labs and discussed the procedure including the risks, benefits and alternatives for the proposed anesthesia with the patient or authorized representative who has indicated his/her understanding and acceptance.   Dental advisory  given  Plan Discussed with: CRNA and Anesthesiologist  Anesthesia Plan Comments:         Anesthesia Quick Evaluation

## 2018-01-15 NOTE — Anesthesia Procedure Notes (Signed)
Procedure Name: Intubation Date/Time: 01/15/2018 8:51 AM Performed by: Philbert Riser, CRNA Pre-anesthesia Checklist: Patient identified, Emergency Drugs available, Suction available, Patient being monitored and Timeout performed Patient Re-evaluated:Patient Re-evaluated prior to induction Oxygen Delivery Method: Circle system utilized and Simple face mask Preoxygenation: Pre-oxygenation with 100% oxygen Induction Type: IV induction Ventilation: Mask ventilation without difficulty Laryngoscope Size: Mac and 3 Grade View: Grade I Tube type: Oral Tube size: 7.5 mm Number of attempts: 1 Airway Equipment and Method: Stylet Placement Confirmation: ETT inserted through vocal cords under direct vision Secured at: 20 cm Tube secured with: Tape Dental Injury: Teeth and Oropharynx as per pre-operative assessment

## 2018-01-15 NOTE — Anesthesia Postprocedure Evaluation (Signed)
Anesthesia Post Note  Patient: Roy Trujillo  Procedure(s) Performed: ROBOT ASSISTED INGUINAL HERNIA REPAIR (Right )  Patient location during evaluation: PACU Anesthesia Type: General Level of consciousness: awake and alert and oriented Pain management: pain level controlled Vital Signs Assessment: post-procedure vital signs reviewed and stable Respiratory status: spontaneous breathing, nonlabored ventilation and respiratory function stable Cardiovascular status: blood pressure returned to baseline and stable Postop Assessment: no signs of nausea or vomiting Anesthetic complications: no     Last Vitals:  Vitals:   01/15/18 1053 01/15/18 1103  BP: 121/74 124/71  Pulse: 77   Resp: 17 16  Temp: 36.6 C 36.7 C  SpO2: 100% 99%    Last Pain:  Vitals:   01/15/18 1128  TempSrc:   PainSc: 5                  Gazelle Towe

## 2018-01-21 ENCOUNTER — Encounter: Payer: Self-pay | Admitting: Surgery

## 2018-02-04 ENCOUNTER — Encounter: Payer: Self-pay | Admitting: Surgery

## 2018-02-04 ENCOUNTER — Ambulatory Visit (INDEPENDENT_AMBULATORY_CARE_PROVIDER_SITE_OTHER): Payer: Medicare Other | Admitting: Surgery

## 2018-02-04 ENCOUNTER — Other Ambulatory Visit: Payer: Self-pay

## 2018-02-04 VITALS — BP 144/74 | HR 84 | Temp 97.9°F | Resp 16 | Ht 66.0 in | Wt 129.6 lb

## 2018-02-04 DIAGNOSIS — Z09 Encounter for follow-up examination after completed treatment for conditions other than malignant neoplasm: Secondary | ICD-10-CM

## 2018-02-04 NOTE — Progress Notes (Signed)
S/p rob IH Doing very well No pain Taking PO, no fevers  PE NAD Abd: soft, nt, no recurrence or infection  A/P Doing very well No heavy lifting RTC prn

## 2018-02-04 NOTE — Patient Instructions (Signed)

## 2018-02-12 ENCOUNTER — Encounter: Payer: Self-pay | Admitting: Emergency Medicine

## 2018-02-12 ENCOUNTER — Emergency Department
Admission: EM | Admit: 2018-02-12 | Discharge: 2018-02-12 | Disposition: A | Discharge status: Home / Self Care | Payer: Medicare Other | Attending: Emergency Medicine | Admitting: Emergency Medicine

## 2018-02-12 ENCOUNTER — Other Ambulatory Visit: Payer: Self-pay

## 2018-02-12 ENCOUNTER — Emergency Department: Payer: Medicare Other

## 2018-02-12 DIAGNOSIS — Z21 Asymptomatic human immunodeficiency virus [HIV] infection status: Secondary | ICD-10-CM | POA: Insufficient documentation

## 2018-02-12 DIAGNOSIS — Z96642 Presence of left artificial hip joint: Secondary | ICD-10-CM | POA: Insufficient documentation

## 2018-02-12 DIAGNOSIS — Z79899 Other long term (current) drug therapy: Secondary | ICD-10-CM | POA: Insufficient documentation

## 2018-02-12 DIAGNOSIS — R197 Diarrhea, unspecified: Secondary | ICD-10-CM

## 2018-02-12 DIAGNOSIS — R1084 Generalized abdominal pain: Secondary | ICD-10-CM | POA: Diagnosis present

## 2018-02-12 DIAGNOSIS — M069 Rheumatoid arthritis, unspecified: Secondary | ICD-10-CM | POA: Diagnosis not present

## 2018-02-12 DIAGNOSIS — R112 Nausea with vomiting, unspecified: Secondary | ICD-10-CM | POA: Insufficient documentation

## 2018-02-12 LAB — COMPREHENSIVE METABOLIC PANEL
ALBUMIN: 4.7 g/dL (ref 3.5–5.0)
ALT: 18 U/L (ref 0–44)
ANION GAP: 17 — AB (ref 5–15)
AST: 28 U/L (ref 15–41)
Alkaline Phosphatase: 63 U/L (ref 38–126)
BILIRUBIN TOTAL: 0.7 mg/dL (ref 0.3–1.2)
BUN: 9 mg/dL (ref 6–20)
CO2: 16 mmol/L — ABNORMAL LOW (ref 22–32)
Calcium: 8.7 mg/dL — ABNORMAL LOW (ref 8.9–10.3)
Chloride: 107 mmol/L (ref 98–111)
Creatinine, Ser: 0.79 mg/dL (ref 0.61–1.24)
GFR calc Af Amer: >60 mL/min (ref >60)
GFR calc non Af Amer: >60 mL/min (ref >60)
Glucose, Bld: 139 mg/dL — ABNORMAL HIGH (ref 70–99)
POTASSIUM: 3.4 mmol/L — AB (ref 3.5–5.1)
Sodium: 140 mmol/L (ref 135–145)
TOTAL PROTEIN: 7.5 g/dL (ref 6.5–8.1)

## 2018-02-12 LAB — CBC
HCT: 44.7 % (ref 39.0–52.0)
Hemoglobin: 15.4 g/dL (ref 13.0–17.0)
MCH: 33.1 pg (ref 26.0–34.0)
MCHC: 34.5 g/dL (ref 30.0–36.0)
MCV: 96.1 fL (ref 80.0–100.0)
NRBC: 0 % (ref 0.0–0.2)
PLATELETS: 133 10*3/uL — AB (ref 150–400)
RBC: 4.65 MIL/uL (ref 4.22–5.81)
RDW: 12.7 % (ref 11.5–15.5)
WBC: 10.3 10*3/uL (ref 4.0–10.5)

## 2018-02-12 LAB — TROPONIN I: Troponin I: <0.03 ng/mL (ref <0.03)

## 2018-02-12 LAB — LIPASE, BLOOD: Lipase: 28 U/L (ref 11–51)

## 2018-02-12 MED ORDER — DICYCLOMINE HCL 20 MG PO TABS: 20.0000 mg | ORAL_TABLET | Freq: Three times a day (TID) | ORAL | 0 refills | Status: DC | PRN

## 2018-02-12 MED ORDER — MORPHINE SULFATE (PF) 4 MG/ML IV SOLN
4.0000 mg | Freq: Once | INTRAVENOUS | Status: AC
  Administered 2018-02-12: 4 mg via INTRAVENOUS
  Filled 2018-02-12: qty 1

## 2018-02-12 MED ORDER — ONDANSETRON HCL 4 MG/2ML IJ SOLN
4.0000 mg | Freq: Once | INTRAMUSCULAR | Status: AC | PRN
  Administered 2018-02-12: 4 mg via INTRAVENOUS
  Filled 2018-02-12: qty 2

## 2018-02-12 MED ORDER — IOPAMIDOL (ISOVUE-300) INJECTION 61%
80.0000 mL | Freq: Once | INTRAVENOUS | Status: AC | PRN
  Administered 2018-02-12: 80 mL via INTRAVENOUS

## 2018-02-12 MED ORDER — SODIUM CHLORIDE 0.9 % IV BOLUS
1000.0000 mL | Freq: Once | INTRAVENOUS | Status: AC
  Administered 2018-02-12: 1000 mL via INTRAVENOUS

## 2018-02-12 NOTE — ED Provider Notes (Signed)
[facility] Emergency Department Provider Note  ____________________________________________   First MD Initiated Contact with Patient 02/12/18 0701     (approximate)  I have reviewed the triage vital signs and the nursing notes.   HISTORY  Chief Complaint Emesis   HPI Roy Trujillo is a 52 y.o. male with a history of HIV on antiretroviral therapy reporting and last viral load that was undetectable who is presenting to the emergency department today with upper and lower abdominal pain over the past 24 hours.  The patient says the pain is intermittent and moderate to severe.  It is associated with multiple episodes of vomiting which are nonbloody.  Also with a minimal amount of diarrhea as well.  No known sick contacts.  Patient says that he had a hernia surgery approximately 1 month ago.  Denies any radiation of the pain to the penis or testicles or any scrotal swelling.  Not reporting any chest pain.   Past Medical History:  Diagnosis Date  . Anxiety   . Arthritis of hand    RA  . Avascular necrosis (HCC)   . Collagen vascular disease (HCC)    RA  . Depression   . GERD (gastroesophageal reflux disease)   . HIV (human immunodeficiency virus infection) [facility])     Patient Active Problem List   Diagnosis Date Noted  . Unilateral recurrent inguinal hernia without obstruction or gangrene   . Dysthymia 04/21/2017  . Severe recurrent major depression without psychotic features (HCC) 02/10/2017  . 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 Altered mental status   . Encephalopathy acute 02/09/2017  . Alcohol abuse 05/15/2016  . Depression 05/15/2016  . 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 Avascular necrosis of bone of hip, right (HCC) 03/31/2016  . HIV infection (HCC) 03/04/1996    Past Surgical History:  Procedure Laterality Date  . CHOLECYSTECTOMY  2013  . HERNIA REPAIR  1974   double inguinal  . JOINT REPLACEMENT Left    left hip   . ROBOT ASSISTED INGUINAL HERNIA REPAIR Right 01/15/2018   Procedure: ROBOT ASSISTED INGUINAL HERNIA REPAIR;  Surgeon: Leafy Ro, MD;  Location: [facility] ORS;  Service: General;  Laterality: Right;  . TONSILLECTOMY     as a child  . TOTAL HIP ARTHROPLASTY Right 05/15/2016   Procedure: TOTAL HIP ARTHROPLASTY;  Surgeon: Donato Heinz, MD;  Location: [facility] ORS;  Service: Orthopedics;  Laterality: Right;    Prior to Admission medications   Medication Sig Start Date End Date Taking? Authorizing Provider  DESCOVY 200-25 MG tablet Take 1 tablet by mouth at bedtime.  10/01/16  Yes [provider]  dolutegravir (TIVICAY) 50 MG tablet Take 50 mg by mouth 2 (two) times daily.  03/27/15  Yes [provider]  gabapentin (NEURONTIN) 300 MG capsule Take 300 mg by mouth 3 (three) times daily.  12/31/17  Yes [provider]  mirtazapine (REMERON) 15 MG tablet Take 15 mg by mouth at bedtime.    Yes [provider]  oxymetazoline (AFRIN) 0.05 % nasal spray Place 1 spray into both nostrils daily as needed for congestion.   Yes [provider]  promethazine (PHENERGAN) 25 MG tablet Take 1 tablet (25 mg total) by mouth every 4 (four) hours as needed for nausea or vomiting. 04/19/15  Yes Emily Filbert, MD  traZODone (DESYREL) 100 MG tablet Take 100 mg by mouth at bedtime.   Yes [provider]  triamcinolone (KENALOG) 0.025 %  ointment Apply 1 application topically daily as needed (dermatitis).   Yes [provider]    Allergies Penicillins; Bactrim [sulfamethoxazole-trimethoprim]; and Zerit [stavudine]  No family history on file.  Social History Social History   Tobacco Use  . Smoking status: Never Smoker  . Smokeless tobacco: Never Used  Substance Use Topics  . Alcohol use: No    Comment: Previous Alcohol Dependence  . Drug use: Yes    Types: Marijuana    Comment: OCC    Review of Systems  Constitutional: No fever/chills Eyes:  No visual changes. ENT: No sore throat. Cardiovascular: Denies chest pain. Respiratory: Denies shortness of breath. Gastrointestinal:  No constipation. Genitourinary: Negative for dysuria. Musculoskeletal: Negative for back pain. Skin: Negative for rash. Neurological: Negative for headaches, focal weakness or numbness.   ____________________________________________   PHYSICAL EXAM:  VITAL SIGNS: ED Triage Vitals  Enc Vitals Group     BP 02/12/18 0630 (!) 159/83     Pulse --      Resp --      Temp --      Temp src --      SpO2 --      Weight 02/12/18 0620 120 lb (54.4 kg)     Height 02/12/18 0620 5\' 6"  (1.676 m)     Head Circumference --      Peak Flow --      Pain Score 02/12/18 0622 10     Pain Loc --      Pain Edu? --      Excl. in GC? --     Constitutional: Alert and oriented.  Patient appears uncomfortable, sitting up on the end of the bed.  Occasionally grabbing his abdomen and wincing in pain. Eyes: Conjunctivae are normal.  Head: Atraumatic. Nose: No congestion/rhinnorhea. Mouth/Throat: Mucous membranes are moist.  Neck: No stridor.   Cardiovascular: Normal rate, regular rhythm. Grossly normal heart sounds.   Respiratory: Normal respiratory effort.  No retractions. Lungs CTAB. Gastrointestinal: Soft with generalized abdominal tenderness to palpation without rebound or guarding.  Tenderness is moderate.  No distention.  Laparoscopy scars appear well-healed and without any surrounding erythema or exudate. Musculoskeletal: No lower extremity tenderness nor edema.  No joint effusions. Neurologic:  Normal speech and language. No gross focal neurologic deficits are appreciated. Skin:  Skin is warm, dry and intact. No rash noted. Psychiatric: Mood and affect are normal. Speech and behavior are normal.  ____________________________________________   LABS (all labs ordered are listed, but only abnormal results are displayed)  Labs Reviewed  COMPREHENSIVE METABOLIC  PANEL - Abnormal; Notable for the following components:      Result Value   Potassium 3.4 (*)    CO2 16 (*)    Glucose, Bld 139 (*)    Calcium 8.7 (*)    Anion gap 17 (*)    All other components within normal limits  CBC - Abnormal; Notable for the following components:   Platelets 133 (*)    All other components within normal limits  LIPASE, BLOOD  TROPONIN I  URINALYSIS, COMPLETE (UACMP) WITH MICROSCOPIC   ____________________________________________  EKG  ED ECG REPORT I, Arelia Longest, the attending physician, personally viewed and interpreted this ECG.   Date: 02/12/2018  EKG Time: 0719  Rate: 70  Rhythm: normal sinus rhythm  Axis: Normal  Intervals:none  ST&T Change: Minimal and diffuse ST elevation with a concave morphology consistent with early repolarization. No significant change from previous. ____________________________________________  RADIOLOGY  CT of the  abdomen and pelvis with previously seen right inguinal hernia that is been repaired.  No acute abnormality identified. ____________________________________________   PROCEDURES  Procedure(s) performed:   Procedures  Critical Care performed:   ____________________________________________   INITIAL IMPRESSION / ASSESSMENT AND PLAN / ED COURSE  Pertinent labs & imaging results that were available during my care of the patient were reviewed by me and considered in my medical decision making (see chart for details).  Differential diagnosis includes, but is not limited to, biliary disease (biliary colic, acute cholecystitis, cholangitis, choledocholithiasis, etc), intrathoracic causes for epigastric abdominal pain including ACS, gastritis, duodenitis, pancreatitis, small bowel or large bowel obstruction, abdominal aortic aneurysm, hernia, and ulcer(s). Differential diagnosis includes, but is not limited to, acute appendicitis, renal colic, testicular torsion, urinary tract infection/pyelonephritis,  prostatitis,  epididymitis, diverticulitis, small bowel obstruction or ileus, colitis, abdominal aortic aneurysm, gastroenteritis, hernia, etc. As part of my medical decision making, I reviewed the following data within the electronic MEDICAL RECORD NUMBER Notes from prior ED visits  ----------------------------------------- 9:28 AM on 02/12/2018 -----------------------------------------  Patient at this time with pain greatly improved.  Tolerating crackers as well as water currently.  Reexamined his abdomen is soft and nontender throughout.  Patient also without any diarrhea during his hospital stay.  Denies any urinary complaints.  Likely viral etiology.  Will be discharged with Bentyl.  Patient says that he already has Phenergan at home.  Patient also denies any recent antibiotics.  Says that he was taken off azithromycin several months ago and has had improvement regarding his HIV labs.  He is understanding of the diagnosis as well as treatment and willing to comply. ____________________________________________   FINAL CLINICAL IMPRESSION(S) / ED DIAGNOSES  Abdominal pain with n/v/d.    NEW MEDICATIONS STARTED DURING THIS VISIT:  New Prescriptions   No medications on file     Note:  This document was prepared using Dragon voice recognition software and may include unintentional dictation errors.     Myrna Blazer, MD 02/12/18 [phone]

## 2018-02-12 NOTE — ED Notes (Signed)
AAOx3.  Skin warm and dry.  NAD 

## 2018-02-12 NOTE — ED Notes (Signed)
Patient transported to CT 

## 2018-02-12 NOTE — ED Triage Notes (Signed)
Pt to triage via w/c, appears uncomfortable, dry heaving; st N/V/D since last night with lower abd pain

## 2019-11-04 IMAGING — DX DG CHEST 1V
1 series · 1 of 1 positions shown · non-contrast
Comparison: November 26, 2016

CLINICAL DATA: Altered mental status

EXAM:
CHEST 1 VIEW

[chest ap]
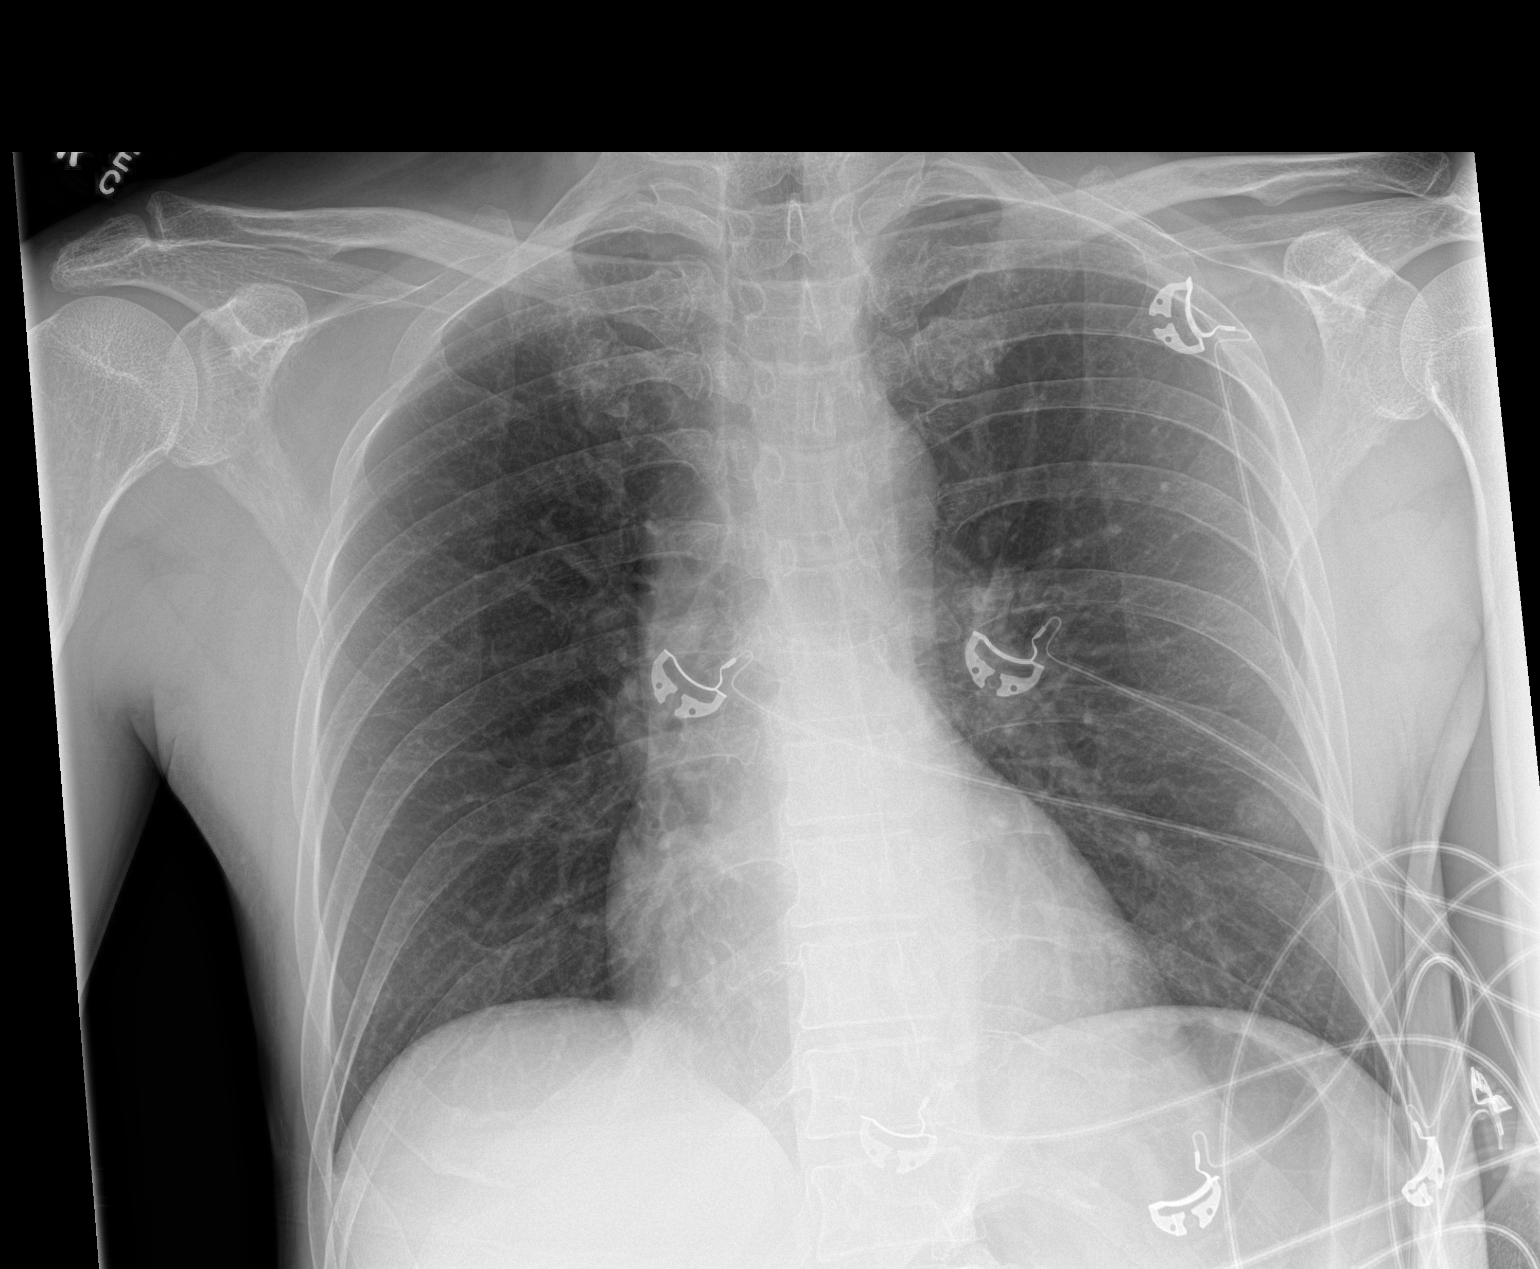

[1 of 1 positions shown; findings below may reference images not displayed]

FINDINGS: There is no edema or consolidation. The heart size and pulmonary
vascularity are normal. No adenopathy. No bone lesions.
IMPRESSION: No edema or consolidation.

## 2020-07-13 ENCOUNTER — Encounter (HOSPITAL_COMMUNITY): Payer: Self-pay

## 2020-07-13 ENCOUNTER — Emergency Department (HOSPITAL_COMMUNITY)
Admission: EM | Admit: 2020-07-13 | Discharge: 2020-07-13 | Disposition: A | Discharge status: Home / Self Care | Payer: Medicare Other | Attending: Emergency Medicine | Admitting: Emergency Medicine

## 2020-07-13 DIAGNOSIS — Z96643 Presence of artificial hip joint, bilateral: Secondary | ICD-10-CM | POA: Insufficient documentation

## 2020-07-13 DIAGNOSIS — K219 Gastro-esophageal reflux disease without esophagitis: Secondary | ICD-10-CM | POA: Diagnosis not present

## 2020-07-13 DIAGNOSIS — K529 Noninfective gastroenteritis and colitis, unspecified: Secondary | ICD-10-CM | POA: Insufficient documentation

## 2020-07-13 DIAGNOSIS — Z21 Asymptomatic human immunodeficiency virus [HIV] infection status: Secondary | ICD-10-CM | POA: Insufficient documentation

## 2020-07-13 DIAGNOSIS — R1084 Generalized abdominal pain: Secondary | ICD-10-CM | POA: Diagnosis present

## 2020-07-13 LAB — CBC WITH DIFFERENTIAL/PLATELET
Abs Immature Granulocytes: 0.12 10*3/uL — ABNORMAL HIGH (ref 0.00–0.07)
Basophils Absolute: 0.1 10*3/uL (ref 0.0–0.1)
Basophils Relative: 1 %
Eosinophils Absolute: 0.1 10*3/uL (ref 0.0–0.5)
Eosinophils Relative: 0 %
HCT: 45.5 % (ref 39.0–52.0)
Hemoglobin: 15.6 g/dL (ref 13.0–17.0)
Immature Granulocytes: 1 %
Lymphocytes Relative: 9 %
Lymphs Abs: 1.8 10*3/uL (ref 0.7–4.0)
MCH: 33.5 pg (ref 26.0–34.0)
MCHC: 34.3 g/dL (ref 30.0–36.0)
MCV: 97.6 fL (ref 80.0–100.0)
Monocytes Absolute: 1.3 10*3/uL — ABNORMAL HIGH (ref 0.1–1.0)
Monocytes Relative: 6 %
Neutro Abs: 17.2 10*3/uL — ABNORMAL HIGH (ref 1.7–7.7)
Neutrophils Relative %: 83 %
Platelets: 174 10*3/uL (ref 150–400)
RBC: 4.66 MIL/uL (ref 4.22–5.81)
RDW: 12.7 % (ref 11.5–15.5)
WBC: 20.6 10*3/uL — ABNORMAL HIGH (ref 4.0–10.5)
nRBC: 0 % (ref 0.0–0.2)

## 2020-07-13 LAB — COMPREHENSIVE METABOLIC PANEL
ALT: 35 U/L (ref 0–44)
AST: 34 U/L (ref 15–41)
Albumin: 4.9 g/dL (ref 3.5–5.0)
Alkaline Phosphatase: 87 U/L (ref 38–126)
Anion gap: 18 — ABNORMAL HIGH (ref 5–15)
BUN: 12 mg/dL (ref 6–20)
CO2: 19 mmol/L — ABNORMAL LOW (ref 22–32)
Calcium: 9.5 mg/dL (ref 8.9–10.3)
Chloride: 104 mmol/L (ref 98–111)
Creatinine, Ser: 0.87 mg/dL (ref 0.61–1.24)
GFR, Estimated: >60 mL/min (ref >60)
Glucose, Bld: 74 mg/dL (ref 70–99)
Potassium: 3.6 mmol/L (ref 3.5–5.1)
Sodium: 141 mmol/L (ref 135–145)
Total Bilirubin: 0.9 mg/dL (ref 0.3–1.2)
Total Protein: 7.7 g/dL (ref 6.5–8.1)

## 2020-07-13 LAB — URINALYSIS, ROUTINE W REFLEX MICROSCOPIC
Bilirubin Urine: NEGATIVE
Glucose, UA: NEGATIVE mg/dL
Hgb urine dipstick: NEGATIVE
Ketones, ur: 20 mg/dL — AB
Leukocytes,Ua: NEGATIVE
Nitrite: NEGATIVE
Protein, ur: NEGATIVE mg/dL
Specific Gravity, Urine: 1.018 (ref 1.005–1.030)
pH: 7 (ref 5.0–8.0)

## 2020-07-13 LAB — CBC
HCT: 45 % (ref 39.0–52.0)
Hemoglobin: 15.6 g/dL (ref 13.0–17.0)
MCH: 33.5 pg (ref 26.0–34.0)
MCHC: 34.7 g/dL (ref 30.0–36.0)
MCV: 96.6 fL (ref 80.0–100.0)
Platelets: 171 10*3/uL (ref 150–400)
RBC: 4.66 MIL/uL (ref 4.22–5.81)
RDW: 12.6 % (ref 11.5–15.5)
WBC: 20.3 10*3/uL — ABNORMAL HIGH (ref 4.0–10.5)
nRBC: 0 % (ref 0.0–0.2)

## 2020-07-13 LAB — LIPASE, BLOOD: Lipase: 26 U/L (ref 11–51)

## 2020-07-13 MED ORDER — PROMETHAZINE HCL 25 MG PO TABS: 25.0000 mg | ORAL_TABLET | Freq: Four times a day (QID) | ORAL | 0 refills | Status: AC | PRN

## 2020-07-13 MED ORDER — FENTANYL CITRATE (PF) 100 MCG/2ML IJ SOLN: 50.0000 ug | Freq: Once | INTRAMUSCULAR | Status: DC

## 2020-07-13 MED ORDER — ONDANSETRON 4 MG PO TBDP
4.0000 mg | ORAL_TABLET | Freq: Once | ORAL | Status: AC
  Administered 2020-07-13: 4 mg via ORAL
  Filled 2020-07-13: qty 1

## 2020-07-13 MED ORDER — SODIUM CHLORIDE 0.9 % IV BOLUS
1000.0000 mL | Freq: Once | INTRAVENOUS | Status: AC
  Administered 2020-07-13: 1000 mL via INTRAVENOUS

## 2020-07-13 MED ORDER — KETOROLAC TROMETHAMINE 30 MG/ML IJ SOLN
15.0000 mg | Freq: Once | INTRAMUSCULAR | Status: AC
  Administered 2020-07-13: 15 mg via INTRAVENOUS
  Filled 2020-07-13: qty 1

## 2020-07-13 MED ORDER — DIPHENHYDRAMINE HCL 50 MG/ML IJ SOLN
12.5000 mg | Freq: Once | INTRAMUSCULAR | Status: AC
  Administered 2020-07-13: 12.5 mg via INTRAVENOUS
  Filled 2020-07-13: qty 1

## 2020-07-13 MED ORDER — METOCLOPRAMIDE HCL 5 MG/ML IJ SOLN
10.0000 mg | Freq: Once | INTRAMUSCULAR | Status: AC
  Administered 2020-07-13: 10 mg via INTRAVENOUS
  Filled 2020-07-13: qty 2

## 2020-07-13 NOTE — Discharge Instructions (Addendum)
Use Phenergan as directed for nausea/vomiting. Drink plenty of fluids to avoid dehydration. Please see your doctor in 2-3 days for recheck.   If you develop a fever, have severe pain, uncontrolled vomiting, bloody stools or vomit, please return to the ED immediately for further evaluation and treatment.

## 2020-07-13 NOTE — ED Notes (Addendum)
Pt ambulating to bathroom for stool sample. Unable to provide sample

## 2020-07-13 NOTE — ED Triage Notes (Signed)
Pt reports that for the past 3-4 hours he has been having n/v and abd pain, diarrhea.

## 2020-07-13 NOTE — ED Provider Notes (Signed)
[facility] EMERGENCY DEPARTMENT Provider Note   CSN: [id number] Arrival date & time: 07/13/20  0137     History Chief Complaint  Patient presents with  . Abdominal Pain    Roy Trujillo is a 55 y.o. male.  Patient to ED with sudden onset generalized abdominal cramping, nausea, TNTC NBNB emesis, TNTC NB stool. No fever. He was completely asymptomatic until around 9:00 pm when symptoms started. No known sick contacts. He denies history of same. No chest pain, SOB, cough, urinary symptoms. He has a history of HIV and reports his disease is stable.   The history is provided by the patient. No language interpreter was used.  Abdominal Pain Associated symptoms: diarrhea, nausea and vomiting   Associated symptoms: no chills, no dysuria and no fever        Past Medical History:  Diagnosis Date  . Anxiety   . Arthritis of hand    RA  . Avascular necrosis (HCC)   . Collagen vascular disease (HCC)    RA  . Depression   . GERD (gastroesophageal reflux disease)   . HIV (human immunodeficiency virus infection) [facility])     Patient Active Problem List   Diagnosis Date Noted  . Unilateral recurrent inguinal hernia without obstruction or gangrene   . Dysthymia 04/21/2017  . Severe recurrent major depression without psychotic features (HCC) 02/10/2017  . 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 Altered mental status   . Encephalopathy acute 02/09/2017  . Alcohol abuse 05/15/2016  . Depression 05/15/2016  . 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 Avascular necrosis of bone of hip, right (HCC) 03/31/2016  . HIV infection (HCC) 03/04/1996    Past Surgical History:  Procedure Laterality Date  . CHOLECYSTECTOMY  2013  . HERNIA REPAIR  1974   double inguinal  . JOINT REPLACEMENT Left    left hip  . ROBOT ASSISTED INGUINAL HERNIA REPAIR Right 01/15/2018   Procedure: ROBOT ASSISTED INGUINAL HERNIA REPAIR;  Surgeon: Leafy Ro, MD;  Location: [facility]  ORS;  Service: General;  Laterality: Right;  . TONSILLECTOMY     as a child  . TOTAL HIP ARTHROPLASTY Right 05/15/2016   Procedure: TOTAL HIP ARTHROPLASTY;  Surgeon: Donato Heinz, MD;  Location: [facility] ORS;  Service: Orthopedics;  Laterality: Right;       No family history on file.  Social History   Tobacco Use  . Smoking status: Never Smoker  . Smokeless tobacco: Never Used  Vaping Use  . Vaping Use: Never used  Substance Use Topics  . Alcohol use: No    Comment: Previous Alcohol Dependence  . Drug use: Yes    Types: Marijuana    Comment: OCC    Home Medications Prior to Admission medications   Medication Sig Start Date End Date Taking? Authorizing Provider  acetaminophen (TYLENOL) 500 MG tablet Take 1,000 mg by mouth every 6 (six) hours as needed for moderate pain or headache.   Yes [provider]  DESCOVY 200-25 MG tablet Take 1 tablet by mouth at bedtime.  10/01/16  Yes [provider]  diphenhydrAMINE (SOMINEX) 25 MG tablet Take 25 mg by mouth at bedtime as needed for allergies.   Yes [provider]  dolutegravir (TIVICAY) 50 MG tablet Take 50 mg by mouth daily. 03/27/15  Yes [provider]  gabapentin (NEURONTIN) 300 MG capsule Take 300 mg by mouth 4 (four) times daily. 12/31/17  Yes [provider]  mirtazapine (REMERON) 15  MG tablet Take 15 mg by mouth at bedtime.    Yes [provider]  oxymetazoline (AFRIN) 0.05 % nasal spray Place 1 spray into both nostrils daily as needed for congestion.   Yes [provider]  traZODone (DESYREL) 100 MG tablet Take 100 mg by mouth at bedtime.   Yes [provider]  triamcinolone (KENALOG) 0.025 % ointment Apply 1 application topically daily as needed (dermatitis).   Yes [provider]    Allergies    Penicillins, Bactrim [sulfamethoxazole-trimethoprim], and Zerit [stavudine]  Review of Systems   Review of Systems  Constitutional: Negative for  chills and fever.  HENT: Negative.   Respiratory: Negative.   Cardiovascular: Negative.   Gastrointestinal: Positive for abdominal pain, diarrhea, nausea and vomiting. Negative for blood in stool.       No hematemesis.  Genitourinary: Negative for dysuria.  Musculoskeletal: Negative.  Negative for myalgias.  Skin: Negative.   Neurological: Negative.     Physical Exam Updated Vital Signs BP 130/74 (BP Location: Right Arm)   Pulse 62   Temp 98 F (36.7 C) (Oral)   Resp 12   Ht 5\' 6"  (1.676 m)   Wt 54.4 kg   SpO2 98%   BMI 19.37 kg/m   Physical Exam Vitals and nursing note reviewed.  Constitutional:      General: He is not in acute distress.    Appearance: He is well-developed. He is not toxic-appearing.  HENT:     Head: Normocephalic.  Cardiovascular:     Rate and Rhythm: Normal rate and regular rhythm.  Pulmonary:     Effort: Pulmonary effort is normal.     Breath sounds: Normal breath sounds. No wheezing, rhonchi or rales.  Abdominal:     General: Bowel sounds are decreased. There is no distension.     Palpations: Abdomen is soft.     Tenderness: There is generalized abdominal tenderness. There is no guarding or rebound.  Musculoskeletal:        General: Normal range of motion.     Cervical back: Normal range of motion and neck supple.  Skin:    General: Skin is warm and dry.     Findings: No rash.  Neurological:     Mental Status: He is alert and oriented to person, place, and time.     ED Results / Procedures / Treatments   Labs (all labs ordered are listed, but only abnormal results are displayed) Labs Reviewed  COMPREHENSIVE METABOLIC PANEL - Abnormal; Notable for the following components:      Result Value   CO2 19 (*)    Anion gap 18 (*)    All other components within normal limits  CBC - Abnormal; Notable for the following components:   WBC 20.3 (*)    All other components within normal limits  URINALYSIS, ROUTINE W REFLEX MICROSCOPIC - Abnormal;  Notable for the following components:   Ketones, ur 20 (*)    All other components within normal limits  LIPASE, BLOOD  DIFFERENTIAL   Results for orders placed or performed during the hospital encounter of 07/13/20  Lipase, blood  Result Value Ref Range   Lipase 26 11 - 51 U/L  Comprehensive metabolic panel  Result Value Ref Range   Sodium 141 135 - 145 mmol/L   Potassium 3.6 3.5 - 5.1 mmol/L   Chloride 104 98 - 111 mmol/L   CO2 19 (L) 22 - 32 mmol/L   Glucose, Bld 74 70 - 99  mg/dL   BUN 12 6 - 20 mg/dL   Creatinine, Ser 2.84 0.61 - 1.24 mg/dL   Calcium 9.5 8.9 - 13.2 mg/dL   Total Protein 7.7 6.5 - 8.1 g/dL   Albumin 4.9 3.5 - 5.0 g/dL   AST 34 15 - 41 U/L   ALT 35 0 - 44 U/L   Alkaline Phosphatase 87 38 - 126 U/L   Total Bilirubin 0.9 0.3 - 1.2 mg/dL   GFR, Estimated >44 >01 mL/min   Anion gap 18 (H) 5 - 15  CBC  Result Value Ref Range   WBC 20.3 (H) 4.0 - 10.5 K/uL   RBC 4.66 4.22 - 5.81 MIL/uL   Hemoglobin 15.6 13.0 - 17.0 g/dL   HCT 02.7 25.3 - 66.4 %   MCV 96.6 80.0 - 100.0 fL   MCH 33.5 26.0 - 34.0 pg   MCHC 34.7 30.0 - 36.0 g/dL   RDW 40.3 47.4 - 25.9 %   Platelets 171 150 - 400 K/uL   nRBC 0.0 0.0 - 0.2 %  Urinalysis, Routine w reflex microscopic Urine, Clean Catch  Result Value Ref Range   Color, Urine YELLOW YELLOW   APPearance CLEAR CLEAR   Specific Gravity, Urine 1.018 1.005 - 1.030   pH 7.0 5.0 - 8.0   Glucose, UA NEGATIVE NEGATIVE mg/dL   Hgb urine dipstick NEGATIVE NEGATIVE   Bilirubin Urine NEGATIVE NEGATIVE   Ketones, ur 20 (A) NEGATIVE mg/dL   Protein, ur NEGATIVE NEGATIVE mg/dL   Nitrite NEGATIVE NEGATIVE   Leukocytes,Ua NEGATIVE NEGATIVE     EKG None  Radiology No results found.  Procedures Procedures   Medications Ordered in ED Medications  ondansetron (ZOFRAN-ODT) disintegrating tablet 4 mg (4 mg Oral Given 07/13/20 0149)    ED Course  I have reviewed the triage vital signs and the nursing notes.  Pertinent labs & imaging  results that were available during my care of the patient were reviewed by me and considered in my medical decision making (see chart for details).    MDM Rules/Calculators/A&P                          Patient to ED with ss/sxs as per HPI.  Labs reviewed. There is a significant leukocytosis of 20.3. He is mildly acidotic, likely related to dehydration. No urinary infection.   IVF's provided, IV Reglan w/Bendaryl, and toradol for headache. On recheck, the patient is tolerating PO fluids and reports his abdominal cramping and headache pain are resolved.  Stool cultures were ordered but the patient has been unable to have a bowel movement while here to provide specimen. He is felt appropriate for discharge home with close PCP follow up. Strict return precautions are discussed. He is felt reliable to return for further evaluation if symptoms recur/worsen. No imaging is felt indicated at this point.  Final Clinical Impression(s) / ED Diagnoses Final diagnoses:  None   1. Gastroenteritis   Rx / DC Orders ED Discharge Orders    None       Elpidio Anis, PA-C 07/13/20 5638    Alvira Monday, MD 07/13/20 1701

## 2020-08-15 ENCOUNTER — Other Ambulatory Visit: Payer: Self-pay | Admitting: Infectious Diseases

## 2020-08-15 DIAGNOSIS — Z21 Asymptomatic human immunodeficiency virus [HIV] infection status: Secondary | ICD-10-CM

## 2020-09-03 IMAGING — CT CT PELVIS W/ CM
2 of 3 series · 17 of 46 positions shown, 19 images · IV contrast (iopamidol)
Comparison: 04/21/2017

CLINICAL DATA: Hydrocele. Pain in the region of the right spermatic
cord.

EXAM:
CT PELVIS WITH CONTRAST
TECHNIQUE: Multidetector CT imaging of the pelvis was performed using the
standard protocol following the bolus administration of intravenous
contrast.
CONTRAST:  80mL [id number] IOPAMIDOL ([id number]) INJECTION 61%

[Series 2: pelvis (person_name) · axial · 0.66mm/px · z∈[-1698,-1398]mm · 14 of 70 slices shown, 16 images (1 of 2)]
[im 5/70  soft-tissue]
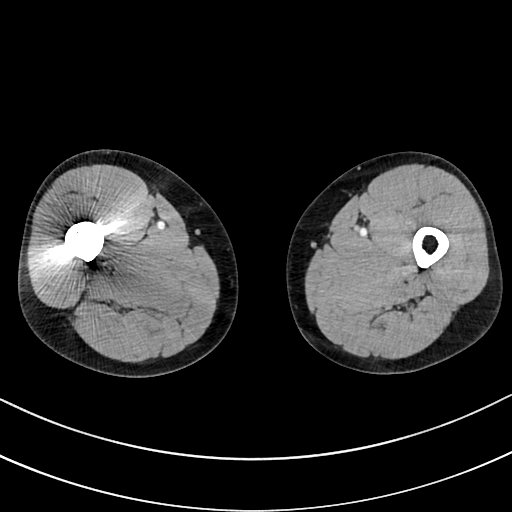
[im 5/70  bone]
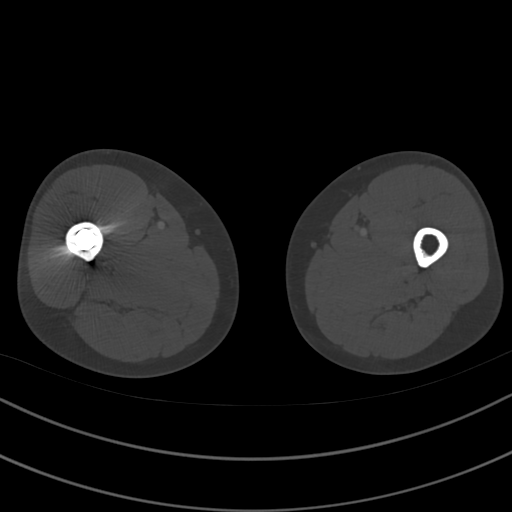
[im 9/70  soft-tissue]
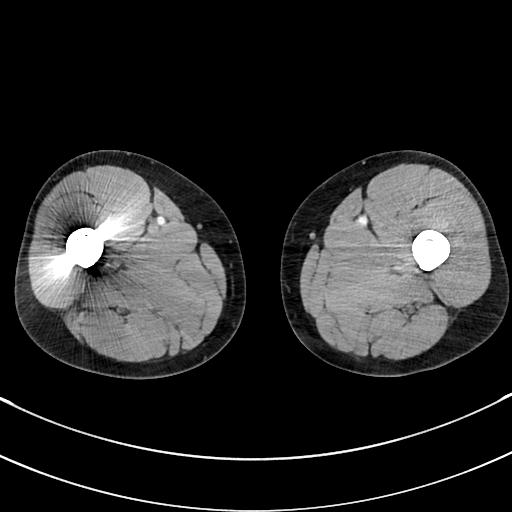
[im 14/70  soft-tissue]
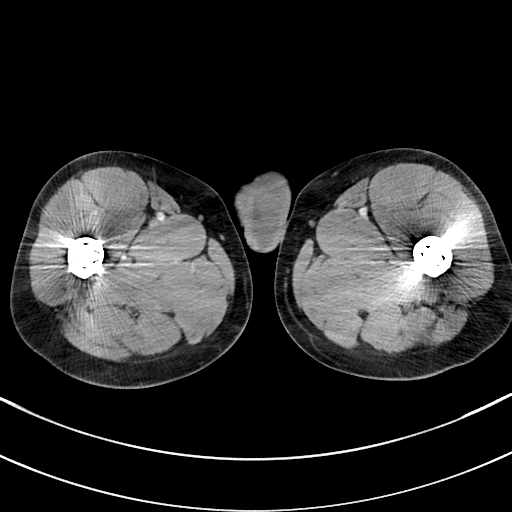
[im 18/70  soft-tissue]
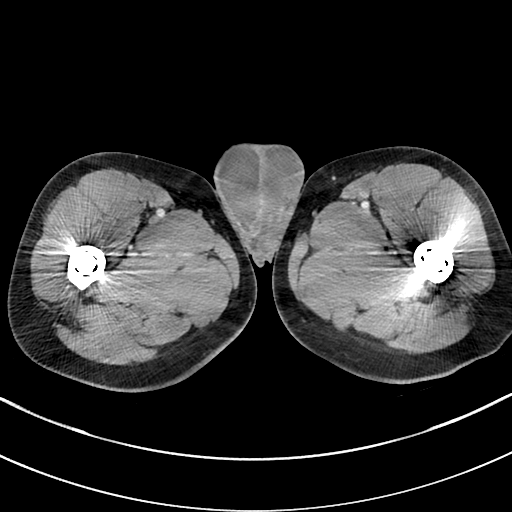
[im 23/70  soft-tissue]
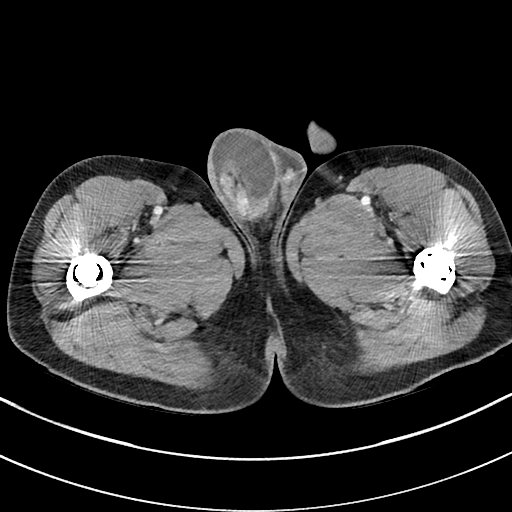
[im 27/70  soft-tissue]
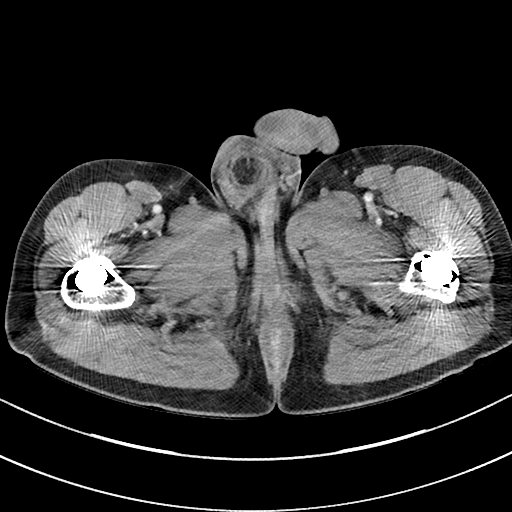
[im 32/70  soft-tissue]
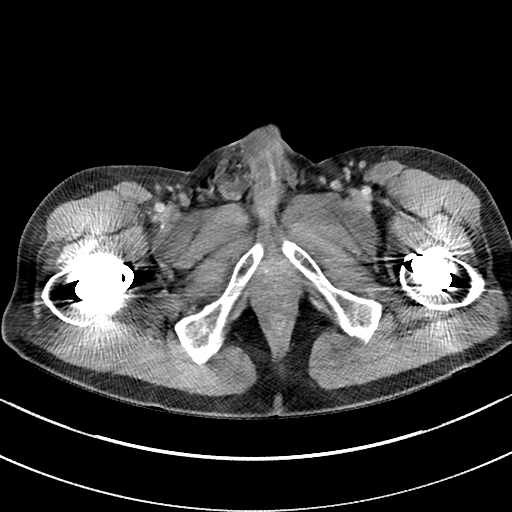
[im 38/70  soft-tissue]
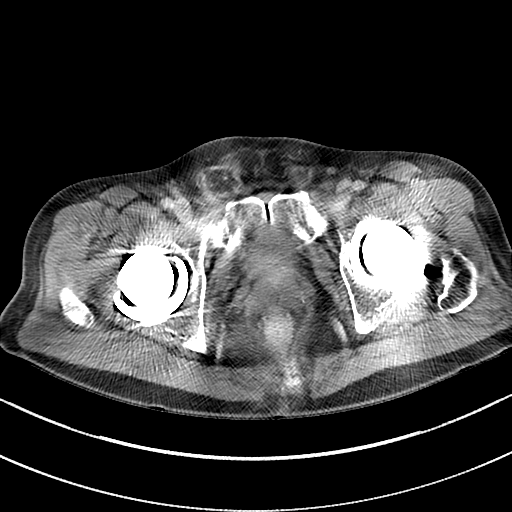
[im 43/70  soft-tissue]
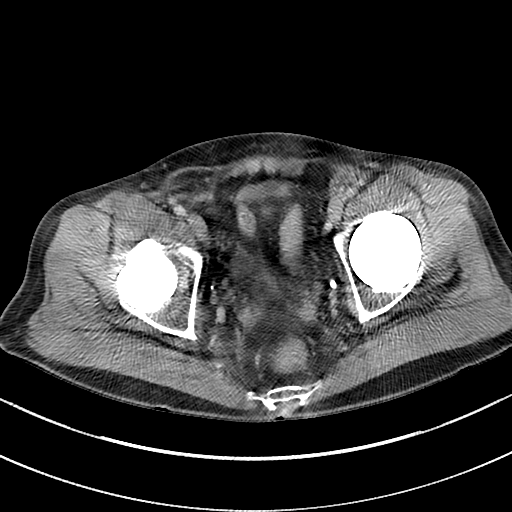
[im 43/70  bone]
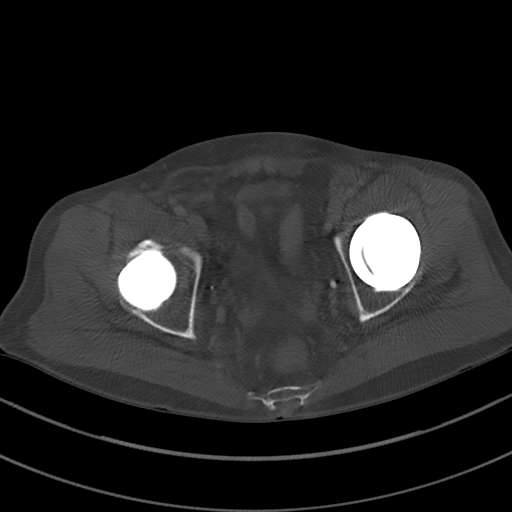
[im 47/70  soft-tissue]
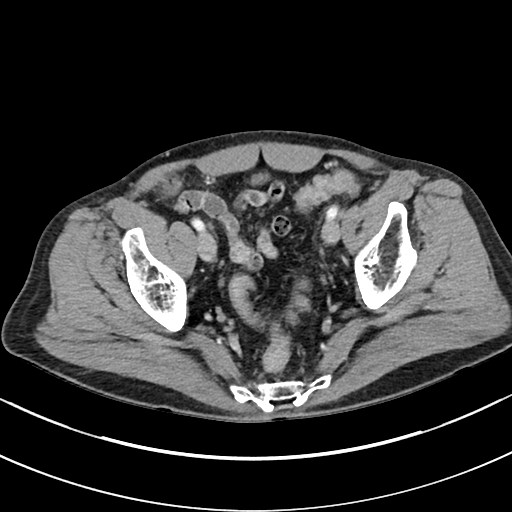
[im 52/70  soft-tissue]
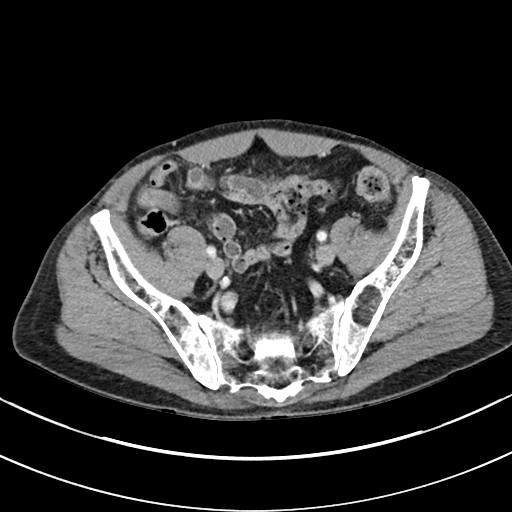
[im 56/70  soft-tissue]
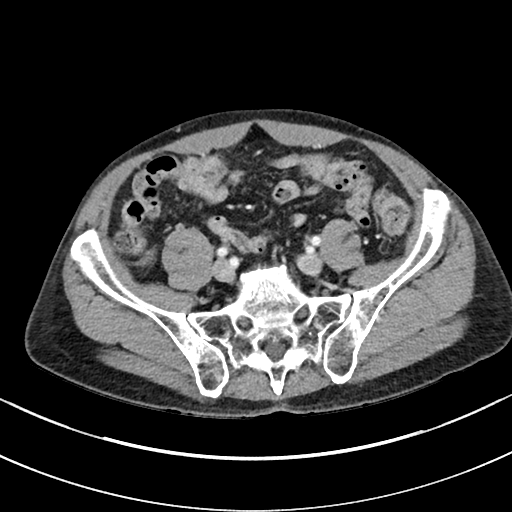
[im 61/70  soft-tissue]
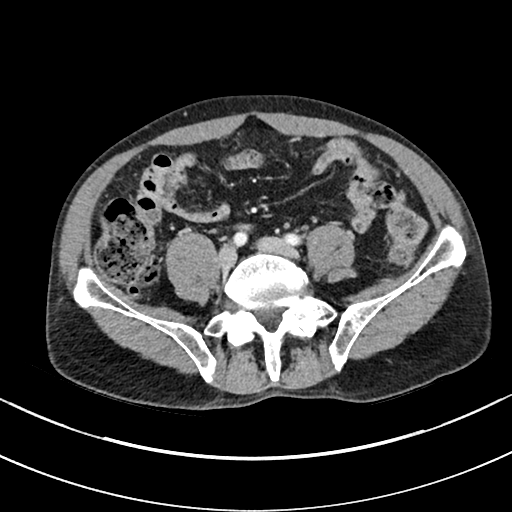
[im 65/70  soft-tissue]
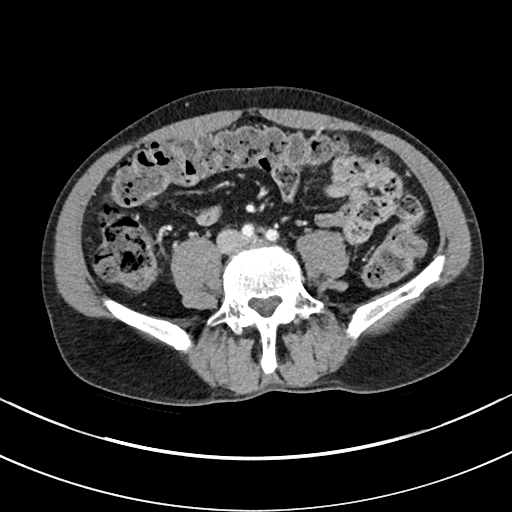

[Series 3: pelvis (person_name) · coronal · 0.66mm/px · 3 of 119 slices shown (2 of 2)]
[im 40/119  soft-tissue]
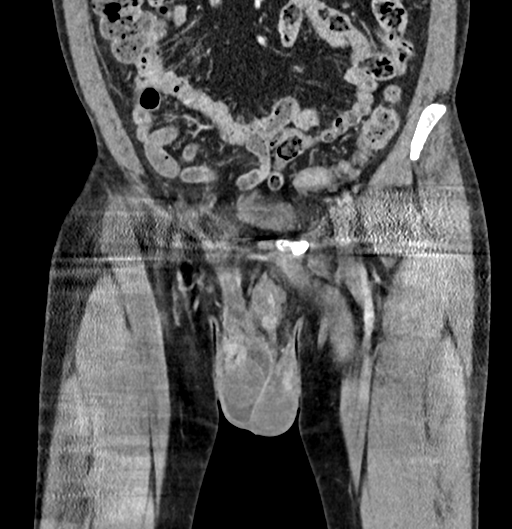
[im 53/119  soft-tissue]
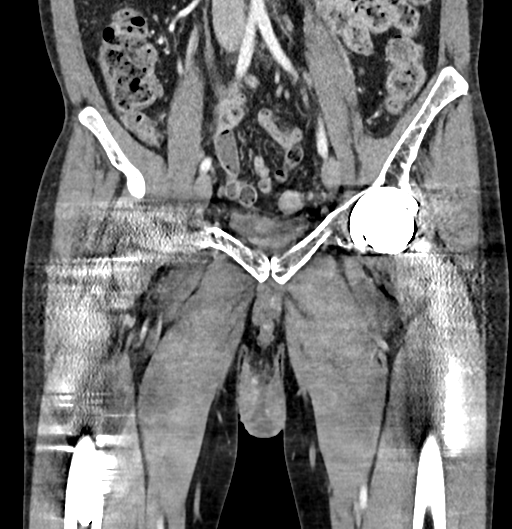
[im 66/119  soft-tissue]
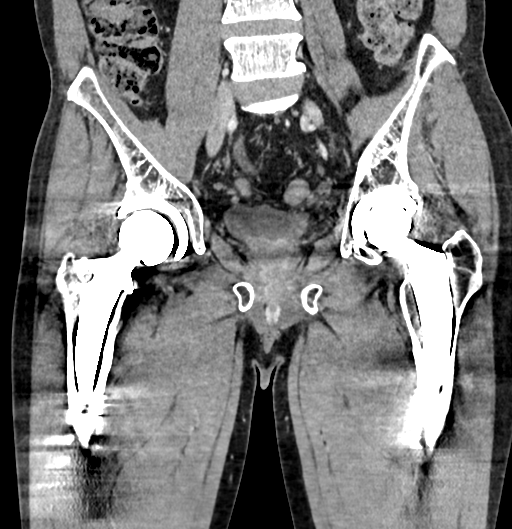

[17 of 46 positions shown; findings below may reference images not displayed]

FINDINGS: Urinary Tract: Bladder is nondistended but largely obscured by beam
hardening artifact from bilateral hip replacement..

Bowel:  No bowel dilatation within the visualized pelvis.

Vascular/Lymphatic: Major aortic in venous anatomy of the pelvis
appears patent. No pelvic lymphadenopathy

Reproductive:  No substantial prostatic enlargement.

Other:  No intraperitoneal free fluid.

Musculoskeletal: A right groin hernia contains only fat. Fluid is
identified in each hemiscrotum. Patient is status post bilateral hip
replacement. No worrisome lytic or sclerotic osseous abnormality.
IMPRESSION: 1. Right groin hernia contains only fat.
2. Small volume fluid noted in each hemiscrotum.
3. Bilateral hip replacement.

## 2021-06-30 ENCOUNTER — Emergency Department (HOSPITAL_COMMUNITY): Payer: Medicare Other

## 2021-06-30 ENCOUNTER — Encounter (HOSPITAL_COMMUNITY): Payer: Self-pay | Admitting: Emergency Medicine

## 2021-06-30 ENCOUNTER — Other Ambulatory Visit: Payer: Self-pay

## 2021-06-30 ENCOUNTER — Inpatient Hospital Stay (HOSPITAL_COMMUNITY)
Admission: EM | Admit: 2021-06-30 | Discharge: 2021-07-03 | DRG: 384 | Disposition: A | Discharge status: Home / Self Care | Payer: Medicare Other | Attending: Internal Medicine | Admitting: Internal Medicine

## 2021-06-30 DIAGNOSIS — Z20822 Contact with and (suspected) exposure to covid-19: Secondary | ICD-10-CM | POA: Diagnosis present

## 2021-06-30 DIAGNOSIS — K2990 Gastroduodenitis, unspecified, without bleeding: Secondary | ICD-10-CM | POA: Diagnosis present

## 2021-06-30 DIAGNOSIS — R197 Diarrhea, unspecified: Secondary | ICD-10-CM | POA: Diagnosis not present

## 2021-06-30 DIAGNOSIS — Z79899 Other long term (current) drug therapy: Secondary | ICD-10-CM

## 2021-06-30 DIAGNOSIS — K269 Duodenal ulcer, unspecified as acute or chronic, without hemorrhage or perforation: Principal | ICD-10-CM

## 2021-06-30 DIAGNOSIS — B20 Human immunodeficiency virus [HIV] disease: Secondary | ICD-10-CM | POA: Diagnosis not present

## 2021-06-30 DIAGNOSIS — R7989 Other specified abnormal findings of blood chemistry: Secondary | ICD-10-CM | POA: Diagnosis not present

## 2021-06-30 DIAGNOSIS — K219 Gastro-esophageal reflux disease without esophagitis: Secondary | ICD-10-CM | POA: Diagnosis present

## 2021-06-30 DIAGNOSIS — K2970 Gastritis, unspecified, without bleeding: Secondary | ICD-10-CM

## 2021-06-30 DIAGNOSIS — R1013 Epigastric pain: Secondary | ICD-10-CM

## 2021-06-30 DIAGNOSIS — Z791 Long term (current) use of non-steroidal anti-inflammatories (NSAID): Secondary | ICD-10-CM

## 2021-06-30 DIAGNOSIS — D649 Anemia, unspecified: Secondary | ICD-10-CM | POA: Diagnosis present

## 2021-06-30 DIAGNOSIS — K253 Acute gastric ulcer without hemorrhage or perforation: Secondary | ICD-10-CM

## 2021-06-30 DIAGNOSIS — Z96643 Presence of artificial hip joint, bilateral: Secondary | ICD-10-CM | POA: Diagnosis present

## 2021-06-30 DIAGNOSIS — G629 Polyneuropathy, unspecified: Secondary | ICD-10-CM

## 2021-06-30 DIAGNOSIS — Z21 Asymptomatic human immunodeficiency virus [HIV] infection status: Secondary | ICD-10-CM | POA: Diagnosis present

## 2021-06-30 DIAGNOSIS — Z9049 Acquired absence of other specified parts of digestive tract: Secondary | ICD-10-CM

## 2021-06-30 DIAGNOSIS — K449 Diaphragmatic hernia without obstruction or gangrene: Secondary | ICD-10-CM | POA: Diagnosis present

## 2021-06-30 DIAGNOSIS — Z880 Allergy status to penicillin: Secondary | ICD-10-CM

## 2021-06-30 DIAGNOSIS — R933 Abnormal findings on diagnostic imaging of other parts of digestive tract: Secondary | ICD-10-CM

## 2021-06-30 DIAGNOSIS — K259 Gastric ulcer, unspecified as acute or chronic, without hemorrhage or perforation: Secondary | ICD-10-CM

## 2021-06-30 DIAGNOSIS — K2960 Other gastritis without bleeding: Secondary | ICD-10-CM | POA: Diagnosis present

## 2021-06-30 DIAGNOSIS — T39395A Adverse effect of other nonsteroidal anti-inflammatory drugs [NSAID], initial encounter: Secondary | ICD-10-CM | POA: Diagnosis present

## 2021-06-30 LAB — COMPREHENSIVE METABOLIC PANEL
ALT: 378 U/L — ABNORMAL HIGH (ref 0–44)
AST: 356 U/L — ABNORMAL HIGH (ref 15–41)
Albumin: 4.5 g/dL (ref 3.5–5.0)
Alkaline Phosphatase: 270 U/L — ABNORMAL HIGH (ref 38–126)
Anion gap: 11 (ref 5–15)
BUN: 10 mg/dL (ref 6–20)
CO2: 21 mmol/L — ABNORMAL LOW (ref 22–32)
Calcium: 9.2 mg/dL (ref 8.9–10.3)
Chloride: 107 mmol/L (ref 98–111)
Creatinine, Ser: 0.97 mg/dL (ref 0.61–1.24)
GFR, Estimated: >60 mL/min (ref >60)
Glucose, Bld: 93 mg/dL (ref 70–99)
Potassium: 3.7 mmol/L (ref 3.5–5.1)
Sodium: 139 mmol/L (ref 135–145)
Total Bilirubin: 1.4 mg/dL — ABNORMAL HIGH (ref 0.3–1.2)
Total Protein: 7.2 g/dL (ref 6.5–8.1)

## 2021-06-30 LAB — MAGNESIUM: Magnesium: 1.7 mg/dL (ref 1.7–2.4)

## 2021-06-30 LAB — CBC
HCT: 46.6 % (ref 39.0–52.0)
Hemoglobin: 16.3 g/dL (ref 13.0–17.0)
MCH: 34.1 pg — ABNORMAL HIGH (ref 26.0–34.0)
MCHC: 35 g/dL (ref 30.0–36.0)
MCV: 97.5 fL (ref 80.0–100.0)
Platelets: UNDETERMINED 10*3/uL (ref 150–400)
RBC: 4.78 MIL/uL (ref 4.22–5.81)
RDW: 12.9 % (ref 11.5–15.5)
WBC: 6.5 10*3/uL (ref 4.0–10.5)
nRBC: 0 % (ref 0.0–0.2)

## 2021-06-30 LAB — HEPATITIS PANEL, ACUTE
HCV Ab: NONREACTIVE
Hep A IgM: NONREACTIVE
Hep B C IgM: NONREACTIVE
Hepatitis B Surface Ag: NONREACTIVE

## 2021-06-30 LAB — URINALYSIS, ROUTINE W REFLEX MICROSCOPIC
Bilirubin Urine: NEGATIVE
Glucose, UA: NEGATIVE mg/dL
Hgb urine dipstick: NEGATIVE
Ketones, ur: 20 mg/dL — AB
Leukocytes,Ua: NEGATIVE
Nitrite: NEGATIVE
Protein, ur: NEGATIVE mg/dL
Specific Gravity, Urine: 1.017 (ref 1.005–1.030)
pH: 5 (ref 5.0–8.0)

## 2021-06-30 LAB — PROTIME-INR
INR: 1.2 (ref 0.8–1.2)
Prothrombin Time: 15.1 seconds (ref 11.4–15.2)

## 2021-06-30 LAB — CK: Total CK: 34 U/L — ABNORMAL LOW (ref 49–397)

## 2021-06-30 LAB — TSH: TSH: 1.033 u[IU]/mL (ref 0.350–4.500)

## 2021-06-30 LAB — RESP PANEL BY RT-PCR (FLU A&B, COVID) ARPGX2
Influenza A by PCR: NEGATIVE
Influenza B by PCR: NEGATIVE
SARS Coronavirus 2 by RT PCR: NEGATIVE

## 2021-06-30 LAB — ETHANOL: Alcohol, Ethyl (B): <10 mg/dL (ref <10)

## 2021-06-30 LAB — PHOSPHORUS: Phosphorus: 2.7 mg/dL (ref 2.5–4.6)

## 2021-06-30 LAB — LIPASE, BLOOD: Lipase: 26 U/L (ref 11–51)

## 2021-06-30 LAB — LACTIC ACID, PLASMA: Lactic Acid, Venous: 0.8 mmol/L (ref 0.5–1.9)

## 2021-06-30 LAB — AMMONIA: Ammonia: 22 umol/L (ref 9–35)

## 2021-06-30 MED ORDER — SODIUM CHLORIDE 0.9 % IV BOLUS
1000.0000 mL | Freq: Once | INTRAVENOUS | Status: AC
  Administered 2021-06-30: 1000 mL via INTRAVENOUS

## 2021-06-30 MED ORDER — GABAPENTIN 300 MG PO CAPS
300.0000 mg | ORAL_CAPSULE | Freq: Three times a day (TID) | ORAL | Status: DC
  Administered 2021-06-30 – 2021-07-03 (×8): 300 mg via ORAL
  Filled 2021-06-30 (×8): qty 1

## 2021-06-30 MED ORDER — MIRTAZAPINE 15 MG PO TABS
15.0000 mg | ORAL_TABLET | Freq: Every day | ORAL | Status: DC
  Filled 2021-06-30: qty 1

## 2021-06-30 MED ORDER — FAMOTIDINE IN NACL 20-0.9 MG/50ML-% IV SOLN
20.0000 mg | Freq: Once | INTRAVENOUS | Status: AC
  Administered 2021-06-30: 20 mg via INTRAVENOUS
  Filled 2021-06-30: qty 50

## 2021-06-30 MED ORDER — IOHEXOL 300 MG/ML  SOLN
100.0000 mL | Freq: Once | INTRAMUSCULAR | Status: AC | PRN
  Administered 2021-06-30: 100 mL via INTRAVENOUS

## 2021-06-30 MED ORDER — ONDANSETRON HCL 4 MG/2ML IJ SOLN: 4.0000 mg | Freq: Four times a day (QID) | INTRAMUSCULAR | Status: DC | PRN

## 2021-06-30 MED ORDER — MIRTAZAPINE 30 MG PO TABS
30.0000 mg | ORAL_TABLET | Freq: Every day | ORAL | Status: DC
  Administered 2021-07-01 – 2021-07-02 (×3): 30 mg via ORAL
  Filled 2021-06-30 (×4): qty 1

## 2021-06-30 MED ORDER — ONDANSETRON HCL 4 MG PO TABS
4.0000 mg | ORAL_TABLET | Freq: Four times a day (QID) | ORAL | Status: DC | PRN
  Administered 2021-06-30: 4 mg via ORAL
  Filled 2021-06-30: qty 1

## 2021-06-30 MED ORDER — FENTANYL CITRATE PF 50 MCG/ML IJ SOSY: 12.5000 ug | PREFILLED_SYRINGE | INTRAMUSCULAR | Status: DC | PRN

## 2021-06-30 MED ORDER — TRAZODONE HCL 100 MG PO TABS
200.0000 mg | ORAL_TABLET | Freq: Every day | ORAL | Status: DC
  Administered 2021-06-30 – 2021-07-02 (×3): 200 mg via ORAL
  Filled 2021-06-30: qty 2
  Filled 2021-06-30: qty 4
  Filled 2021-06-30: qty 2

## 2021-06-30 MED ORDER — EMTRICITABINE-TENOFOVIR AF 200-25 MG PO TABS
1.0000 | ORAL_TABLET | Freq: Every day | ORAL | Status: DC
  Administered 2021-06-30 – 2021-07-02 (×4): 1 via ORAL
  Filled 2021-06-30 (×3): qty 1

## 2021-06-30 MED ORDER — DOLUTEGRAVIR SODIUM 50 MG PO TABS: 50.0000 mg | ORAL_TABLET | Freq: Every day | ORAL | Status: DC

## 2021-06-30 MED ORDER — SODIUM CHLORIDE 0.9 % IV SOLN: INTRAVENOUS | Status: DC | PRN

## 2021-06-30 MED ORDER — KETOROLAC TROMETHAMINE 30 MG/ML IJ SOLN
30.0000 mg | Freq: Once | INTRAMUSCULAR | Status: AC
  Administered 2021-06-30: 30 mg via INTRAVENOUS
  Filled 2021-06-30: qty 1

## 2021-06-30 MED ORDER — HYDROCODONE-ACETAMINOPHEN 5-325 MG PO TABS
1.0000 | ORAL_TABLET | ORAL | Status: DC | PRN
  Administered 2021-06-30 – 2021-07-02 (×8): 2 via ORAL
  Filled 2021-06-30 (×8): qty 2

## 2021-06-30 MED ORDER — MORPHINE SULFATE (PF) 4 MG/ML IV SOLN
4.0000 mg | Freq: Once | INTRAVENOUS | Status: AC
  Administered 2021-06-30: 4 mg via INTRAVENOUS
  Filled 2021-06-30: qty 1

## 2021-06-30 NOTE — Assessment & Plan Note (Signed)
-   Continue home meds °

## 2021-06-30 NOTE — H&P (Signed)
? ? ?Roy Trujillo [id number]:[id number] DOB: 1965-10-02 DOA: 06/30/2021 ?  ?  ?PCP: [facility]   ?Outpatient Specialists:   ?  ?[facility] ?Patient arrived to ER on 06/30/21 at 1514 ?Referred by Attending Horton, Clabe Seal, DO ? ? ?Patient coming from:   ? home Lives  With family ?  ? ?Chief Complaint:   ?Chief Complaint  ?Patient presents with  ? Abdominal Pain  ? ? ?HPI: ?Roy Trujillo is a 56 y.o. male with medical history significant of HIV, compliant with meds ? Sp cholecystectomy ? ?Presented with  abd pain ?Pt comes in with abdominal pain N/V/D for 1 wk N/V gotten better but still has Diarrhea ?No fever ?Past hx of HIV ? No fever no chill no sick contacts ?Denies etOH  ?Does not tobacco  ?marijuana few times a month ? ? Initial COVID TEST  ?NEGATIVE  ? ?Lab Results  ?Component Value Date  ? SARSCOV2NAA NEGATIVE 06/30/2021  ? ?  ?Regarding pertinent Chronic problems:   ?  ?HIV on HIV meds ? ?While in ER: ?  ?LFT elevated CT worrisome for billairy dilation but sp cholecystectomy ?   ?CTabd/pelvis - 1. Suggestion of pre pyloric gastric wall thickening, can be seen ?with gastritis or peptic ulcer disease. ?2. Mild hepatic steatosis. ?3. Stable biliary dilatation, typically related to prior ?cholecystectomy. Recommend correlation with LFTs. If elevated, ?consider further evaluation with MRCP. ?  ? ?Following Medications were ordered in ER: ?Medications  ?sodium chloride 0.9 % bolus 1,000 mL (1,000 mLs Intravenous New Bag/Given 06/30/21 1805)  ?ketorolac (TORADOL) 30 MG/ML injection 30 mg (30 mg Intravenous Given 06/30/21 1717)  ?iohexol (OMNIPAQUE) 300 MG/ML solution 100 mL (100 mLs Intravenous Contrast Given 06/30/21 1819)  ?  ?_______________________________________________________ ?ER Provider Called:  [facility]   Dr. Russella Dar ?They Recommend admit to medicine  agree w MRCP ?Will see in AM  ?  ?ED Triage Vitals  ?Enc Vitals Group  ?   BP 06/30/21 1524 (!) 161/74  ?   Pulse Rate  06/30/21 1524 66  ?   Resp 06/30/21 1524 16  ?   Temp 06/30/21 1524 97.9 ?F (36.6 ?C)  ?   Temp Source 06/30/21 1524 Oral  ?   SpO2 06/30/21 1524 100 %  ?   Weight --   ?   Height --   ?   Head Circumference --   ?   Peak Flow --   ?   Pain Score 06/30/21 1525 10  ?   Pain Loc --   ?   Pain Edu? --   ?   Excl. in GC? --   ?UUVO(53)@    ? _________________________________________ ?Significant initial  Findings: ?Abnormal Labs Reviewed  ?COMPREHENSIVE METABOLIC PANEL - Abnormal; Notable for the following components:  ?    Result Value  ? CO2 21 (*)   ? AST 356 (*)   ? ALT 378 (*)   ? Alkaline Phosphatase 270 (*)   ? Total Bilirubin 1.4 (*)   ? All other components within normal limits  ?CBC - Abnormal; Notable for the following components:  ? MCH 34.1 (*)   ? All other components within normal limits  ?URINALYSIS, ROUTINE W REFLEX MICROSCOPIC - Abnormal; Notable for the following components:  ? Ketones, ur 20 (*)   ? All other components within normal limits  ? ?  ?ECG: Ordered  ?The recent clinical data is shown below. ?Vitals:  ? 06/30/21 1745 06/30/21  1800 06/30/21 1830 06/30/21 2000  ?BP: 125/74 125/72 (!) 144/73 (!) 150/84  ?Pulse: 60 60 62 61  ?Resp:   15 16  ?Temp:      ?TempSrc:      ?SpO2: 98% 98% 99% 97%  ? ?  ?WBC ? ?   ?Component Value Date/Time  ? WBC 6.5 06/30/2021 1533  ? LYMPHSABS 1.8 07/13/2020 0158  ? LYMPHSABS 0.7 02/09/2017 2341  ? MONOABS 1.3 (H) 07/13/2020 0158  ? EOSABS 0.1 07/13/2020 0158  ? EOSABS 0.0 02/09/2017 2341  ? BASOSABS 0.1 07/13/2020 0158  ? BASOSABS 0.0 02/09/2017 2341  ?  ?   ? UA   no evidence of UTI   ?  ?Urine analysis: ?   ?Component Value Date/Time  ? COLORURINE YELLOW 06/30/2021 1533  ? APPEARANCEUR CLEAR 06/30/2021 1533  ? LABSPEC 1.017 06/30/2021 1533  ? PHURINE 5.0 06/30/2021 1533  ? GLUCOSEU NEGATIVE 06/30/2021 1533  ? HGBUR NEGATIVE 06/30/2021 1533  ? BILIRUBINUR NEGATIVE 06/30/2021 1533  ? KETONESUR 20 (A) 06/30/2021 1533  ? PROTEINUR NEGATIVE 06/30/2021 1533  ? NITRITE  NEGATIVE 06/30/2021 1533  ? LEUKOCYTESUR NEGATIVE 06/30/2021 1533  ? ? ?Results for orders placed or performed during the hospital encounter of 11/14/17  ?Chlamydia/NGC rt PCR ([facility] only)     Status: None  ? Collection Time: 11/14/17  4:43 PM  ? Specimen: Urine, Clean Catch  ?Result Value Ref Range Status  ? Specimen source GC/Chlam URINE, RANDOM  Final  ? Chlamydia Tr NOT DETECTED NOT DETECTED Final  ? N gonorrhoeae NOT DETECTED NOT DETECTED Final  ?  Comment: (NOTE) ?This CT/NG assay has not been evaluated in patients with a history of  ?hysterectomy. ?Performed at [facility], [address] [place], ?[place] [id number] ?  ?  ?_______________________________________________ ?Hospitalist was called for admission for elevated LFt's ? ? ?The following Work up has been ordered so far: ? ?Orders Placed This Encounter  ?Procedures  ? CT Abdomen Pelvis W Contrast  ? Lipase, blood  ? Comprehensive metabolic panel  ? CBC  ? Urinalysis, Routine w reflex microscopic  ? Hepatitis panel, acute  ? T-helper cells (CD4) count (not at [facility])  ? Diet NPO time specified  ? Consult to gastroenterology  ? Consult for Unassigned Medical Admission  ?  ? ?OTHER Significant initial  Findings: ? ?labs showing:  ?Recent Labs  ?Lab 06/30/21 ?1533  ?NA 139  ?K 3.7  ?CO2 21*  ?GLUCOSE 93  ?BUN 10  ?CREATININE 0.97  ?CALCIUM 9.2  ? ? ?Cr   stable,   ?Lab Results  ?Component Value Date  ? CREATININE 0.97 06/30/2021  ? CREATININE 0.87 07/13/2020  ? CREATININE 0.79 02/12/2018  ? ? ?Recent Labs  ?Lab 06/30/21 ?1533  ?AST 356*  ?ALT 378*  ?ALKPHOS 270*  ?BILITOT 1.4*  ?PROT 7.2  ?ALBUMIN 4.5  ? ?Lab Results  ?Component Value Date  ? CALCIUM 9.2 06/30/2021  ?  ?Plt: ?Lab Results  ?Component Value Date  ? PLT PLATELET CLUMPS NOTED ON SMEAR, UNABLE TO ESTIMATE 06/30/2021  ?   ?Recent Labs  ?Lab 06/30/21 ?1533  ?WBC 6.5  ?HGB 16.3  ?HCT 46.6  ?MCV 97.5  ?PLT PLATELET CLUMPS NOTED ON SMEAR, UNABLE TO ESTIMATE  ? ? ?HG/HCT  stable,   ?    ?Component Value Date/Time  ? HGB 16.3 06/30/2021 1533  ? HGB 14.4 02/09/2017 2341  ? HCT 46.6 06/30/2021 1533  ? HCT 42.4 02/09/2017 2341  ? MCV 97.5  06/30/2021 1533  ? MCV 99 (H) 02/09/2017 2341  ?  ?Recent Labs  ?Lab 06/30/21 ?1533  ?LIPASE 26  ? ?No results for input(s): AMMONIA in the last 168 hours. ?  ? ?Cardiac Panel (last 3 results) ?No results for input(s): CKTOTAL, CKMB, TROPONINI, RELINDX in the last 72 hours. ? .car ?BNP (last 3 results) ?No results for input(s): BNP in the last 8760 hours. ?  ? ?DM  labs:  ?HbA1C: ?No results for input(s): HGBA1C in the last 8760 hours. ?  ?  ?CBG (last 3)  ?No results for input(s): GLUCAP in the last 72 hours. ?  ? ?    ?Cultures: ?   ?Component Value Date/Time  ? SDES URINE, RANDOM 05/01/2016 1259  ? SPECREQUEST Normal 05/01/2016 1259  ? CULT  05/01/2016 1259  ?  NO GROWTH ?Performed at [facility], [address], [place], [place] [address] ?  ? REPTSTATUS 05/02/2016 FINAL 05/01/2016 1259  ? ?  ?Radiological Exams on Admission: ?CT Abdomen Pelvis W Contrast ? ?Result Date: 06/30/2021 ?CLINICAL DATA:  Acute abdominal pain. EXAM: CT ABDOMEN AND PELVIS WITH CONTRAST TECHNIQUE: Multidetector CT imaging of the abdomen and pelvis was performed using the standard protocol following bolus administration of intravenous contrast. RADIATION DOSE REDUCTION: This exam was performed according to the departmental dose-optimization program which includes automated exposure control, adjustment of the mA and/or kV according to patient size and/or use of iterative reconstruction technique. CONTRAST:  OMNIPAQUE IOHEXOL 300 MG/ML  SOLN COMPARISON:  02/12/2018 FINDINGS: Lower chest: No acute airspace disease or pleural effusion. The heart is normal in size. Hepatobiliary: Mild decreased hepatic density consistent with steatosis. No focal liver lesion. Post cholecystectomy. Stable biliary dilatation with common bile duct measuring 12 mm proximally and 12 mm distally. No  visible choledocholithiasis or obstructing lesion. Minimal central intrahepatic biliary ductal dilatation. Pancreas: No ductal dilatation or inflammation. No visible pancreatic mass. Spleen: Normal in size with

## 2021-06-30 NOTE — Assessment & Plan Note (Addendum)
[facility] has been consulted will see in AM  ?MRCP ordered ?Review meds for haptic toxicity ?Will hold Tivikay for now as it can increase LFTs ? ? ?

## 2021-06-30 NOTE — ED Provider Notes (Signed)
?[facility] EMERGENCY DEPARTMENT ?Provider Note ? ? ?CSN: [id number] ?Arrival date & time: 06/30/21  1514 ? ?  ? ?History ? ?Chief Complaint  ?Patient presents with  ? Abdominal Pain  ? ? ?Roy Trujillo is a 56 y.o. male. ? ?HPI ? ?56 year old male with past medical history of HIV currently on treatment following as an outpatient presents to the emergency department generalized abdominal pain.  Patient states has been going on for the past week, associated with watery nonbloody diarrhea.  He initially had nausea with vomiting but this is resolved and his primary concern is diarrhea.  He has had a previous cholecystectomy but denies any other surgeries in the abdomen.  Denies any fever, recent traveling, sick contacts.  No genitourinary symptoms.  States he is otherwise been compliant with his medications. ? ?Home Medications ?Prior to Admission medications   ?Medication Sig Start Date End Date Taking? Authorizing Provider  ?acetaminophen (TYLENOL) 500 MG tablet Take 1,000 mg by mouth every 6 (six) hours as needed for moderate pain or headache.    [provider]  ?DESCOVY 200-25 MG tablet Take 1 tablet by mouth at bedtime.  10/01/16   [provider]  ?diphenhydrAMINE (SOMINEX) 25 MG tablet Take 25 mg by mouth at bedtime as needed for allergies.    [provider]  ?dolutegravir (TIVICAY) 50 MG tablet Take 50 mg by mouth daily. 03/27/15   [provider]  ?gabapentin (NEURONTIN) 300 MG capsule Take 300 mg by mouth 4 (four) times daily. 12/31/17   [provider]  ?mirtazapine (REMERON) 15 MG tablet Take 15 mg by mouth at bedtime.     [provider]  ?oxymetazoline (AFRIN) 0.05 % nasal spray Place 1 spray into both nostrils daily as needed for congestion.    [provider]  ?promethazine (PHENERGAN) 25 MG tablet Take 1 tablet (25 mg total) by mouth every 6 (six) hours as needed for nausea or vomiting. 07/13/20   Elpidio Anis, PA-C   ?traZODone (DESYREL) 100 MG tablet Take 100 mg by mouth at bedtime.    [provider]  ?triamcinolone (KENALOG) 0.025 % ointment Apply 1 application topically daily as needed (dermatitis).    [provider]  ?   ? ?Allergies    ?Penicillins, Bactrim [sulfamethoxazole-trimethoprim], and Zerit [stavudine]   ? ?Review of Systems   ?Review of Systems  ?Constitutional:  Positive for appetite change and fatigue. Negative for fever.  ?Respiratory:  Negative for shortness of breath.   ?Cardiovascular:  Negative for chest pain.  ?Gastrointestinal:  Positive for abdominal pain, diarrhea, nausea and vomiting. Negative for blood in stool.  ?Skin:  Negative for rash.  ?Neurological:  Negative for headaches.  ? ?Physical Exam ?Updated Vital Signs ?BP 134/73   Pulse 62   Temp 97.9 ?F (36.6 ?C) (Oral)   Resp 18   SpO2 99%  ?Physical Exam ?Vitals and nursing note reviewed.  ?Constitutional:   ?   Appearance: Normal appearance.  ?HENT:  ?   Head: Normocephalic.  ?   Mouth/Throat:  ?   Mouth: Mucous membranes are moist.  ?Cardiovascular:  ?   Rate and Rhythm: Normal rate.  ?Pulmonary:  ?   Effort: Pulmonary effort is normal. No respiratory distress.  ?Abdominal:  ?   General: Bowel sounds are increased. There is no distension.  ?   Palpations: Abdomen is soft.  ?   Tenderness: There is no abdominal tenderness.  ?Skin: ?   General: Skin is  warm.  ?Neurological:  ?   Mental Status: He is alert and oriented to person, place, and time. Mental status is at baseline.  ?Psychiatric:     ?   Mood and Affect: Mood normal.  ? ? ?ED Results / Procedures / Treatments   ?Labs ?(all labs ordered are listed, but only abnormal results are displayed) ?Labs Reviewed  ?COMPREHENSIVE METABOLIC PANEL - Abnormal; Notable for the following components:  ?    Result Value  ? CO2 21 (*)   ? AST 356 (*)   ? ALT 378 (*)   ? Alkaline Phosphatase 270 (*)   ? Total Bilirubin 1.4 (*)   ? All other components within normal limits  ?CBC -  Abnormal; Notable for the following components:  ? MCH 34.1 (*)   ? All other components within normal limits  ?URINALYSIS, ROUTINE W REFLEX MICROSCOPIC - Abnormal; Notable for the following components:  ? Ketones, ur 20 (*)   ? All other components within normal limits  ?LIPASE, BLOOD  ?HEPATITIS PANEL, ACUTE  ?T-HELPER CELLS (CD4) COUNT (NOT AT [facility])  ? ? ?EKG ?None ? ?Radiology ?No results found. ? ?Procedures ?Procedures  ? ? ?Medications Ordered in ED ?Medications  ?sodium chloride 0.9 % bolus 1,000 mL (1,000 mLs Intravenous New Bag/Given 06/30/21 1805)  ?ketorolac (TORADOL) 30 MG/ML injection 30 mg (30 mg Intravenous Given 06/30/21 1717)  ?iohexol (OMNIPAQUE) 300 MG/ML solution 100 mL (100 mLs Intravenous Contrast Given 06/30/21 1819)  ? ? ?ED Course/ Medical Decision Making/ A&P ?  ?                        ?Medical Decision Making ?Amount and/or Complexity of Data Reviewed ?Labs: ordered. ?Radiology: ordered. ? ?Risk ?Prescription drug management. ?Decision regarding hospitalization. ? ? ?This patient presents to the ED for concern of abdominal pain/watery diarrhea, this involves an extensive number of treatment options, and is a complaint that carries with it a high risk of complications and morbidity.  The differential diagnosis includes infection, gastroenteritis, colitis, pancreatitis ? ? ?Additional history obtained: ?-Additional history obtained from spouse at bedside ?-External records from outside source obtained and reviewed including: Chart review including previous notes, labs, imaging, consultation notes ? ? ?Lab Tests: ?-I ordered, reviewed, and interpreted labs.  The pertinent results include: Transaminitis, elevated alkaline phosphatase, bilirubin of 1.4 ? ? ?Imaging Studies ordered: ?-I ordered imaging studies including CT of the abdomen pelvis ?-I independently visualized and interpreted imaging which showed dilated biliary duct, mild gastritis ?-I agree with the radiologist  interpretation ? ? ?Medicines ordered and prescription drug management: ?-I ordered medication including IV fluids/pain medicine for symptoms ?-Reevaluation of the patient after these medicines showed that the patient improved ?-I have reviewed the patients home medicines and have made adjustments as needed ? ? ?ED Course: ?56 year old male presents emergency department abdominal pain, watery diarrhea for a week.  Vitals are stable on arrival, abdomen is diffusely tender, nondistended.  Increased bowel sounds.Blood work shows a new transaminitis with elevated alkaline phosphatase, bilirubin is 1.4.  CT the abdomen pelvis shows biliary duct dilation.  In combination with newly elevating liver function test GI consultation was obtained.  Spoke with Dr. Russella DarStark who agrees with medical admission, stool studies and plan for MRCP tomorrow morning.  Patient will be treated symptomatically admitted to the medicine team.  Patients evaluation and results requires admission for further treatment and care.  Spoke with hospitalist, reviewed patient's ED course and they  accept admission.  Patient agrees with admission plan, offers no new complaints and is stable/unchanged at time of admit ? ? ?Consultations Obtained: ?I requested consultation with the gastroenterologist, Dr. Russella Dar,  and discussed lab and imaging findings as well as pertinent plan - they recommend: Medical admission, stool studies, MRCP ? ? ?Critical Interventions: ?GI consultation, admission ? ? ?Cardiac Monitoring: ?The patient was maintained on a cardiac monitor.  I personally viewed and interpreted the cardiac monitored which showed an underlying rhythm of: Sinus ? ? ?Reevaluation: ?After the interventions noted above, I reevaluated the patient and found that they have :improved ? ? ?Dispostion: ?Patients evaluation and results requires admission for further treatment and care.  Spoke with hospitalist, reviewed patient's ED course and they accept admission.   Patient agrees with admission plan, offers no new complaints and is stable/unchanged at time of admit. ? ?. ? ? ? ? ? ? ? ? ?Final Clinical Impression(s) / ED Diagnoses ?Final diagnoses:  ?None  ? ? ?Rx / DC Orders ?ED Discharge O

## 2021-06-30 NOTE — Assessment & Plan Note (Addendum)
Check CD4 counts and HIV viral load ?Continue Descovy hold Tivicay as it can causeLFT elevation ?Until work up is completed  ?

## 2021-06-30 NOTE — ED Triage Notes (Signed)
C/o generalized abd pain with nausea, vomiting, and diarrhea x 1 week. ?

## 2021-06-30 NOTE — Assessment & Plan Note (Signed)
Order gastric panel ? ?

## 2021-06-30 NOTE — Subjective & Objective (Signed)
Pt comes in with abdominal pain N/V/D for 1 wk N/V gotten better but still has Diarrhea ?No fever ?Past hx of HIV ?

## 2021-07-01 ENCOUNTER — Observation Stay (HOSPITAL_COMMUNITY): Payer: Medicare Other

## 2021-07-01 DIAGNOSIS — K219 Gastro-esophageal reflux disease without esophagitis: Secondary | ICD-10-CM | POA: Diagnosis present

## 2021-07-01 DIAGNOSIS — R197 Diarrhea, unspecified: Secondary | ICD-10-CM | POA: Diagnosis present

## 2021-07-01 DIAGNOSIS — R7989 Other specified abnormal findings of blood chemistry: Secondary | ICD-10-CM | POA: Diagnosis not present

## 2021-07-01 DIAGNOSIS — D649 Anemia, unspecified: Secondary | ICD-10-CM | POA: Diagnosis present

## 2021-07-01 DIAGNOSIS — Z791 Long term (current) use of non-steroidal anti-inflammatories (NSAID): Secondary | ICD-10-CM

## 2021-07-01 DIAGNOSIS — R1013 Epigastric pain: Secondary | ICD-10-CM

## 2021-07-01 DIAGNOSIS — Z21 Asymptomatic human immunodeficiency virus [HIV] infection status: Secondary | ICD-10-CM | POA: Diagnosis present

## 2021-07-01 DIAGNOSIS — K2960 Other gastritis without bleeding: Secondary | ICD-10-CM | POA: Diagnosis present

## 2021-07-01 DIAGNOSIS — K3189 Other diseases of stomach and duodenum: Secondary | ICD-10-CM | POA: Diagnosis not present

## 2021-07-01 DIAGNOSIS — Z20822 Contact with and (suspected) exposure to covid-19: Secondary | ICD-10-CM | POA: Diagnosis present

## 2021-07-01 DIAGNOSIS — G629 Polyneuropathy, unspecified: Secondary | ICD-10-CM | POA: Diagnosis present

## 2021-07-01 DIAGNOSIS — K259 Gastric ulcer, unspecified as acute or chronic, without hemorrhage or perforation: Secondary | ICD-10-CM | POA: Diagnosis present

## 2021-07-01 DIAGNOSIS — K449 Diaphragmatic hernia without obstruction or gangrene: Secondary | ICD-10-CM | POA: Diagnosis present

## 2021-07-01 DIAGNOSIS — K269 Duodenal ulcer, unspecified as acute or chronic, without hemorrhage or perforation: Secondary | ICD-10-CM | POA: Diagnosis present

## 2021-07-01 DIAGNOSIS — Z9049 Acquired absence of other specified parts of digestive tract: Secondary | ICD-10-CM | POA: Diagnosis not present

## 2021-07-01 DIAGNOSIS — B20 Human immunodeficiency virus [HIV] disease: Secondary | ICD-10-CM | POA: Diagnosis not present

## 2021-07-01 DIAGNOSIS — Z96643 Presence of artificial hip joint, bilateral: Secondary | ICD-10-CM | POA: Diagnosis present

## 2021-07-01 DIAGNOSIS — Z79899 Other long term (current) drug therapy: Secondary | ICD-10-CM | POA: Diagnosis not present

## 2021-07-01 DIAGNOSIS — K279 Peptic ulcer, site unspecified, unspecified as acute or chronic, without hemorrhage or perforation: Secondary | ICD-10-CM | POA: Diagnosis not present

## 2021-07-01 DIAGNOSIS — K2990 Gastroduodenitis, unspecified, without bleeding: Secondary | ICD-10-CM | POA: Diagnosis present

## 2021-07-01 DIAGNOSIS — F418 Other specified anxiety disorders: Secondary | ICD-10-CM | POA: Diagnosis not present

## 2021-07-01 DIAGNOSIS — T39395A Adverse effect of other nonsteroidal anti-inflammatory drugs [NSAID], initial encounter: Secondary | ICD-10-CM | POA: Diagnosis present

## 2021-07-01 DIAGNOSIS — Z880 Allergy status to penicillin: Secondary | ICD-10-CM | POA: Diagnosis not present

## 2021-07-01 LAB — COMPREHENSIVE METABOLIC PANEL
ALT: 437 U/L — ABNORMAL HIGH (ref 0–44)
AST: 543 U/L — ABNORMAL HIGH (ref 15–41)
Albumin: 3.9 g/dL (ref 3.5–5.0)
Alkaline Phosphatase: 310 U/L — ABNORMAL HIGH (ref 38–126)
Anion gap: 7 (ref 5–15)
BUN: 9 mg/dL (ref 6–20)
CO2: 25 mmol/L (ref 22–32)
Calcium: 8.9 mg/dL (ref 8.9–10.3)
Chloride: 108 mmol/L (ref 98–111)
Creatinine, Ser: 1.04 mg/dL (ref 0.61–1.24)
GFR, Estimated: >60 mL/min (ref >60)
Glucose, Bld: 97 mg/dL (ref 70–99)
Potassium: 3.9 mmol/L (ref 3.5–5.1)
Sodium: 140 mmol/L (ref 135–145)
Total Bilirubin: 1.5 mg/dL — ABNORMAL HIGH (ref 0.3–1.2)
Total Protein: 6.3 g/dL — ABNORMAL LOW (ref 6.5–8.1)

## 2021-07-01 LAB — CBC
HCT: 41.3 % (ref 39.0–52.0)
Hemoglobin: 14.3 g/dL (ref 13.0–17.0)
MCH: 33.6 pg (ref 26.0–34.0)
MCHC: 34.6 g/dL (ref 30.0–36.0)
MCV: 96.9 fL (ref 80.0–100.0)
Platelets: 88 K/uL — ABNORMAL LOW (ref 150–400)
RBC: 4.26 MIL/uL (ref 4.22–5.81)
RDW: 13.1 % (ref 11.5–15.5)
WBC: 5.3 K/uL (ref 4.0–10.5)
nRBC: 0 % (ref 0.0–0.2)

## 2021-07-01 LAB — LACTIC ACID, PLASMA: Lactic Acid, Venous: 1.1 mmol/L (ref 0.5–1.9)

## 2021-07-01 LAB — PROTIME-INR
INR: 1 (ref 0.8–1.2)
Prothrombin Time: 12.9 s (ref 11.4–15.2)

## 2021-07-01 MED ORDER — PANTOPRAZOLE SODIUM 40 MG PO TBEC
40.0000 mg | DELAYED_RELEASE_TABLET | Freq: Two times a day (BID) | ORAL | Status: DC
  Administered 2021-07-01 – 2021-07-03 (×5): 40 mg via ORAL
  Filled 2021-07-01 (×5): qty 1

## 2021-07-01 MED ORDER — MIRTAZAPINE 30 MG PO TABS
30.0000 mg | ORAL_TABLET | Freq: Every day | ORAL | Status: DC
  Filled 2021-07-01: qty 1

## 2021-07-01 MED ORDER — DOLUTEGRAVIR SODIUM 50 MG PO TABS
50.0000 mg | ORAL_TABLET | Freq: Every day | ORAL | Status: DC
  Administered 2021-07-01 – 2021-07-02 (×2): 50 mg via ORAL
  Filled 2021-07-01 (×2): qty 1

## 2021-07-01 NOTE — Hospital Course (Addendum)
Mr. Roy Trujillo is a 56 yo male with PMH depression/anxiety, HIV, GERD who presented with abdominal pain.  ?He was found to have elevated LFTs concerning for possible obstruction although does have a history of cholecystectomy approximately 5 years ago.  He underwent CT abdomen/pelvis which showed prepyloric gastric wall thickening and stable biliary dilatation.  He then underwent MRCP which showed mild intra and extrahepatic biliary ductal dilatation, no choledocholithiasis and no obstructing mass.  CBD dilated at 11 mm similar to prior dilation seen in 2019.  Also subtle edema noted along the duodenal bulb and descending duodenum tracking into hepatoduodenal ligament. ?With further collateral information he reports that he has been on twice daily naproxen for the past several months due to pains with his hands.  He also endorses occasional EtOH use but moreso rare (had a 40oz about 1 month ago, but endorses a history of "alcoholism").  Smokes marijuana intermittently. ?He was admitted for further work-up and evaluation by GI. ?His diarrhea resolved, no a stool evaluate for further work-up.  His LFTs were downtrending and plans for outpatient follow-up with GI until resolution.  Due to his long-term use of NSAIDs and anemia underwent EGD 5/2 and found to have gastric and duodenal ulcers plan is to continue PPI x6 weeks and follow-up with GI as outpatient.  GI has cleared the patient for discharge home after he tolerates soft diet today.  Rest of his medical issues including neuropathy HIV infection are chronic and he will follow-up with his primary physician/ID.  Patient feels comfortable going home ?

## 2021-07-01 NOTE — Assessment & Plan Note (Signed)
Continue gabapentin.

## 2021-07-01 NOTE — Assessment & Plan Note (Addendum)
-   differential includes infectious vs intermittent from HIV meds side effects vs due to ulcerations ?-He has not had any diarrhea since admission.  Stool studies canceled per GI as well as contact precautions ?

## 2021-07-01 NOTE — ED Notes (Signed)
ED TO INPATIENT HANDOFF REPORT ? ?ED Nurse Name and Phone #:  ? ?S ?Name/Age/Gender ?Roy Trujillo ?56 y.o. ?male ?Room/Bed: 038C/038C ? ?Code Status ?  Code Status: Full Code ? ?Home/SNF/Other ?Home ?Patient oriented to: self, place, time, and situation ?Is this baseline? Yes  ? ?Triage Complete: Triage complete  ?Chief Complaint ?Elevated LFTs [R79.89] ? ?Triage Note ?C/o generalized abd pain with nausea, vomiting, and diarrhea x 1 week.  ? ?Allergies ?Allergies  ?Allergen Reactions  ? Penicillins Other (See Comments)  ?  Unknown childhood reaction ?Has patient had a PCN reaction causing immediate rash, facial/tongue/throat swelling, SOB or lightheadedness with hypotension: Unknown ?Has patient had a PCN reaction causing severe rash involving mucus membranes or skin necrosis: Unknown ?Has patient had a PCN reaction that required hospitalization: Unknown ?Has patient had a PCN reaction occurring within the last 10 years: Unknown ?If all of the above answers are "NO", then may proceed with Cephalosporin use. ?  ? Lexapro [Escitalopram] Other (See Comments)  ?  seizure  ? Bactrim [Sulfamethoxazole-Trimethoprim] Rash  ? Zerit [Stavudine] Rash  ? ? ?Level of Care/Admitting Diagnosis ?ED Disposition   ? ? ED Disposition  ?Admit  ? Condition  ?--  ? Comment  ?Hospital Area: [facility] [[id number]] ? Level of Care: Telemetry Medical [104] ? May place patient in observation at [facility] or [facility] if equivalent level of care is available:: No ? Covid Evaluation: Asymptomatic - no recent exposure (last 10 days) testing not required ? Diagnosis: Elevated LFTs [321910] ? Admitting Physician: Therisa Doyne [[id number]] ? Attending Physician: Therisa Doyne [[id number]] ?  ?  ? ?  ? ? ?B ?Medical/Surgery History ?Past Medical History:  ?Diagnosis Date  ? Anxiety   ? Arthritis of hand   ? RA  ? Avascular necrosis (HCC)   ? Collagen vascular disease (HCC)   ? RA  ? Depression   ? GERD (gastroesophageal  reflux disease)   ? HIV (human immunodeficiency virus infection) (HCC)   ? ?Past Surgical History:  ?Procedure Laterality Date  ? CHOLECYSTECTOMY  2013  ? HERNIA REPAIR  1974  ? double inguinal  ? JOINT REPLACEMENT Left   ? left hip  ? ROBOT ASSISTED INGUINAL HERNIA REPAIR Right 01/15/2018  ? Procedure: ROBOT ASSISTED INGUINAL HERNIA REPAIR;  Surgeon: Leafy Ro, MD;  Location: [facility] ORS;  Service: General;  Laterality: Right;  ? TONSILLECTOMY    ? as a child  ? TOTAL HIP ARTHROPLASTY Right 05/15/2016  ? Procedure: TOTAL HIP ARTHROPLASTY;  Surgeon: Donato Heinz, MD;  Location: [facility] ORS;  Service: Orthopedics;  Laterality: Right;  ?  ? ?A ?IV Location/Drains/Wounds ?Patient Lines/Drains/Airways Status   ? ? Active Line/Drains/Airways   ? ? Name Placement date Placement time Site Days  ? Peripheral IV 06/30/21 20 G Right Antecubital 06/30/21  1716  Antecubital  1  ? Incision (Closed) 05/15/16 Hip Right 05/15/16  0903  -- 1873  ? Incision - 3 Ports Abdomen Umbilicus Right;Lateral Left;Lateral 01/15/18  0930  -- 1263  ? ?  ?  ? ?  ? ? ?Intake/Output Last 24 hours ? ?Intake/Output Summary (Last 24 hours) at 07/01/2021 0209 ?Last data filed at 06/30/2021 2150 ?Gross per 24 hour  ?Intake 1050 ml  ?Output --  ?Net 1050 ml  ? ? ?Labs/Imaging ?Results for orders placed or performed during the hospital encounter of 06/30/21 (from the past 48 hour(s))  ?Lipase, blood     Status: None  ?  Collection Time: 06/30/21  3:33 PM  ?Result Value Ref Range  ? Lipase 26 11 - 51 U/L  ?  Comment: Performed at [facility], [address], [place], [place] [address]  ?Comprehensive metabolic panel     Status: Abnormal  ? Collection Time: 06/30/21  3:33 PM  ?Result Value Ref Range  ? Sodium 139 135 - 145 mmol/L  ? Potassium 3.7 3.5 - 5.1 mmol/L  ? Chloride 107 98 - 111 mmol/L  ? CO2 21 (L) 22 - 32 mmol/L  ? Glucose, Bld 93 70 - 99 mg/dL  ?  Comment: Glucose reference range applies only to samples taken after fasting for at least 8  hours.  ? BUN 10 6 - 20 mg/dL  ? Creatinine, Ser 0.97 0.61 - 1.24 mg/dL  ? Calcium 9.2 8.9 - 10.3 mg/dL  ? Total Protein 7.2 6.5 - 8.1 g/dL  ? Albumin 4.5 3.5 - 5.0 g/dL  ? AST 356 (H) 15 - 41 U/L  ? ALT 378 (H) 0 - 44 U/L  ? Alkaline Phosphatase 270 (H) 38 - 126 U/L  ? Total Bilirubin 1.4 (H) 0.3 - 1.2 mg/dL  ? GFR, Estimated >60 >60 mL/min  ?  Comment: (NOTE) ?Calculated using the CKD-EPI Creatinine Equation (2021) ?  ? Anion gap 11 5 - 15  ?  Comment: Performed at [facility], [address], [place], [place] [address]  ?CBC     Status: Abnormal  ? Collection Time: 06/30/21  3:33 PM  ?Result Value Ref Range  ? WBC 6.5 4.0 - 10.5 K/uL  ? RBC 4.78 4.22 - 5.81 MIL/uL  ? Hemoglobin 16.3 13.0 - 17.0 g/dL  ? HCT 46.6 39.0 - 52.0 %  ? MCV 97.5 80.0 - 100.0 fL  ? MCH 34.1 (H) 26.0 - 34.0 pg  ? MCHC 35.0 30.0 - 36.0 g/dL  ? RDW 12.9 11.5 - 15.5 %  ? Platelets PLATELET CLUMPS NOTED ON SMEAR, UNABLE TO ESTIMATE 150 - 400 K/uL  ? nRBC 0.0 0.0 - 0.2 %  ?  Comment: Performed at [facility], [address], [place], [place] [address]  ?Urinalysis, Routine w reflex microscopic Urine, Clean Catch     Status: Abnormal  ? Collection Time: 06/30/21  3:33 PM  ?Result Value Ref Range  ? Color, Urine YELLOW YELLOW  ? APPearance CLEAR CLEAR  ? Specific Gravity, Urine 1.017 1.005 - 1.030  ? pH 5.0 5.0 - 8.0  ? Glucose, UA NEGATIVE NEGATIVE mg/dL  ? Hgb urine dipstick NEGATIVE NEGATIVE  ? Bilirubin Urine NEGATIVE NEGATIVE  ? Ketones, ur 20 (A) NEGATIVE mg/dL  ? Protein, ur NEGATIVE NEGATIVE mg/dL  ? Nitrite NEGATIVE NEGATIVE  ? Leukocytes,Ua NEGATIVE NEGATIVE  ?  Comment: Performed at [facility], [address], [place], [place] [address]  ?Hepatitis panel, acute     Status: None  ? Collection Time: 06/30/21  4:51 PM  ?Result Value Ref Range  ? Hepatitis B Surface Ag NON REACTIVE NON REACTIVE  ? HCV Ab NON REACTIVE NON REACTIVE  ?  Comment: (NOTE) ?Nonreactive HCV antibody screen is consistent with no HCV infections,   ?unless recent infection is suspected or other evidence exists to ?indicate HCV infection. ? ?  ? Hep A IgM NON REACTIVE NON REACTIVE  ? Hep B C IgM NON REACTIVE NON REACTIVE  ?  Comment: Performed at [facility], [address], [place], [place] [address]  ?Resp  Panel by RT-PCR (Flu A&B, Covid) Nasopharyngeal Swab     Status: None  ? Collection Time: 06/30/21  8:59 PM  ? Specimen: Nasopharyngeal Swab; Nasopharyngeal(NP) swabs in vial transport medium  ?Result Value Ref Range  ? SARS Coronavirus 2 by RT PCR NEGATIVE NEGATIVE  ?  Comment: (NOTE) ?SARS-CoV-2 target nucleic acids are NOT DETECTED. ? ?The SARS-CoV-2 RNA is generally detectable in upper respiratory ?specimens during the acute phase of infection. The lowest ?concentration of SARS-CoV-2 viral copies this assay can detect is ?138 copies/mL. A negative result does not preclude SARS-Cov-2 ?infection and should not be used as the sole basis for treatment or ?other patient management decisions. A negative result may occur with  ?improper specimen collection/handling, submission of specimen other ?than nasopharyngeal swab, presence of viral mutation(s) within the ?areas targeted by this assay, and inadequate number of viral ?copies(<138 copies/mL). A negative result must be combined with ?clinical observations, patient history, and epidemiological ?information. The expected result is Negative. ? ?Fact Sheet for Patients:  ?BloggerCourse.comhttps://www.fda.gov/media/152166/download ? ?Fact Sheet for Healthcare Providers:  ?SeriousBroker.ithttps://www.fda.gov/media/152162/download ? ?This test is no t yet approved or cleared by the [place] FDA and  ?has been authorized for detection and/or diagnosis of SARS-CoV-2 by ?FDA under an Emergency Use Authorization (EUA). This EUA will remain  ?in effect (meaning this test can be used) for the duration of the ?COVID-19 declaration under Section 564(b)(1) of the Act, 21 ?U.S.C.section 360bbb-3(b)(1), unless the authorization is terminated   ?or revoked sooner.  ? ? ?  ? Influenza A by PCR NEGATIVE NEGATIVE  ? Influenza B by PCR NEGATIVE NEGATIVE  ?  Comment: (NOTE) ?The Xpert Xpress SARS-CoV-2/FLU/RSV plus assay is intended as an aid ?in the d

## 2021-07-01 NOTE — Progress Notes (Signed)
?Progress Note ? ? ? ?Roy Trujillo   ?DEY:[id number]  ?DOB: 1965/09/06  ?DOA: 06/30/2021     0 ?PCP: [facility] ? ?Initial CC: diarrhea ? ?Hospital Course: ?Roy Trujillo is a 56 yo male with PMH depression/anxiety, HIV, GERD who presented with abdominal pain.  ?He was found to have elevated LFTs concerning for possible obstruction although does have a history of cholecystectomy approximately 5 years ago.  He underwent CT abdomen/pelvis which showed prepyloric gastric wall thickening and stable biliary dilatation.  He then underwent MRCP which showed mild intra and extrahepatic biliary ductal dilatation, no choledocholithiasis and no obstructing mass.  CBD dilated at 11 mm similar to prior dilation seen in 2019.  Also subtle edema noted along the duodenal bulb and descending duodenum tracking into hepatoduodenal ligament. ? ?With further collateral information he reports that he has been on twice daily naproxen for the past several months due to pains with his hands.  He also endorses occasional EtOH use but moreso rare (had a 40oz about 1 month ago, but endorses a history of "alcoholism").  Smokes marijuana intermittently. ? ?He was admitted for further work-up and evaluation by GI. ? ?Interval History:  ?Seen this morning with his husband present bedside.  Still having ongoing diarrhea at home.  He reports ongoing daily use of naproxen for the past several months.  He also reports worrying constantly.  He does endorse a history of alcohol abuse but states it is very rare now and denies any tobacco use, mostly intermittent marijuana use. ? ?Assessment and Plan: ?* Diarrhea ?- differential includes infectious vs intermittent from HIV meds side effects vs due to ulcerations ?- follow up GI pathogen panel ? ?Elevated LFTs ?- Elevated AST/ALT on admission and now with worsening elevation along with increased alk phos and total bili. Has history of CCY and residual dilation expected  plus MRCP negative for obstruction. Remaining differential for etiology would be a passed obstruction (stone vs sludge) vs possible med side effect ?- GI consulted on admission as well; follow up further rec's ? ?NSAID long-term use ?- Patient endorses to taking prescription strength naproxen BID for several months for ongoing pain in his hands  ?- also has some occasional etoh use and THC use ?- at risk for PUD given this usage ?- imaging shows edema involving duodenal bulb and descending duodenum.  Concerning for possible underlying duodenitis/PUD ?- GI following, possible EGD during hospitalization ? ?Neuropathy ?- Continue gabapentin ? ?HIV infection ([place]) ?- on Descovy and Tivicay with good control per patient ?- follows with HIV clinic in [place] ?- only prior labs are from 2018 and likely not accurate ?- hx of mild LFT elevation and the current elevation is not likely to be due to his HIV meds which should be uninterrupted unless necessary which I do not think is necessary at this time ?- continue both at this time ? ? ?Old records reviewed in assessment of this patient ? ?Antimicrobials: ? ? ?DVT prophylaxis:  ?SCDs Start: 06/30/21 2200 ? ? ?Code Status:   Code Status: Full Code ? ?Disposition Plan:  Home in 2-3 days ?Status is: Inpt ? ?Objective: ?Blood pressure 118/64, pulse (!) 59, temperature 97.8 ?F (36.6 ?C), temperature source Oral, resp. rate 18, height $RemoveBe'5\' 6"'AcfsLATAX$  (1.676 m), weight 55.2 kg, SpO2 100 %.  ?Examination:  ?Physical Exam ?Constitutional:   ?   General: He is not in acute distress. ?   Appearance: Normal appearance.  ?HENT:  ?  Head: Normocephalic and atraumatic.  ?   Mouth/Throat:  ?   Mouth: Mucous membranes are moist.  ?Eyes:  ?   Extraocular Movements: Extraocular movements intact.  ?Cardiovascular:  ?   Rate and Rhythm: Normal rate and regular rhythm.  ?   Heart sounds: Normal heart sounds.  ?Pulmonary:  ?   Effort: Pulmonary effort is normal. No respiratory distress.  ?   Breath  sounds: Normal breath sounds. No wheezing.  ?Abdominal:  ?   General: Bowel sounds are normal.  ?   Palpations: Abdomen is soft.  ?   Comments: Mild abdominal pain noted worse in epigastrium with no rebound or guarding.  Bowel sounds present.  ?Musculoskeletal:     ?   General: Normal range of motion.  ?   Cervical back: Normal range of motion and neck supple.  ?Skin: ?   General: Skin is warm and dry.  ?Neurological:  ?   General: No focal deficit present.  ?   Mental Status: He is alert.  ?Psychiatric:     ?   Mood and Affect: Mood normal.     ?   Behavior: Behavior normal.  ?  ? ?Consultants:  ?GI ? ?Procedures:  ? ? ?Data Reviewed: ?Results for orders placed or performed during the hospital encounter of 06/30/21 (from the past 24 hour(s))  ?Lipase, blood     Status: None  ? Collection Time: 06/30/21  3:33 PM  ?Result Value Ref Range  ? Lipase 26 11 - 51 U/L  ?Comprehensive metabolic panel     Status: Abnormal  ? Collection Time: 06/30/21  3:33 PM  ?Result Value Ref Range  ? Sodium 139 135 - 145 mmol/L  ? Potassium 3.7 3.5 - 5.1 mmol/L  ? Chloride 107 98 - 111 mmol/L  ? CO2 21 (L) 22 - 32 mmol/L  ? Glucose, Bld 93 70 - 99 mg/dL  ? BUN 10 6 - 20 mg/dL  ? Creatinine, Ser 0.97 0.61 - 1.24 mg/dL  ? Calcium 9.2 8.9 - 10.3 mg/dL  ? Total Protein 7.2 6.5 - 8.1 g/dL  ? Albumin 4.5 3.5 - 5.0 g/dL  ? AST 356 (H) 15 - 41 U/L  ? ALT 378 (H) 0 - 44 U/L  ? Alkaline Phosphatase 270 (H) 38 - 126 U/L  ? Total Bilirubin 1.4 (H) 0.3 - 1.2 mg/dL  ? GFR, Estimated >60 >60 mL/min  ? Anion gap 11 5 - 15  ?CBC     Status: Abnormal  ? Collection Time: 06/30/21  3:33 PM  ?Result Value Ref Range  ? WBC 6.5 4.0 - 10.5 K/uL  ? RBC 4.78 4.22 - 5.81 MIL/uL  ? Hemoglobin 16.3 13.0 - 17.0 g/dL  ? HCT 46.6 39.0 - 52.0 %  ? MCV 97.5 80.0 - 100.0 fL  ? MCH 34.1 (H) 26.0 - 34.0 pg  ? MCHC 35.0 30.0 - 36.0 g/dL  ? RDW 12.9 11.5 - 15.5 %  ? Platelets PLATELET CLUMPS NOTED ON SMEAR, UNABLE TO ESTIMATE 150 - 400 K/uL  ? nRBC 0.0 0.0 - 0.2 %   ?Urinalysis, Routine w reflex microscopic Urine, Clean Catch     Status: Abnormal  ? Collection Time: 06/30/21  3:33 PM  ?Result Value Ref Range  ? Color, Urine YELLOW YELLOW  ? APPearance CLEAR CLEAR  ? Specific Gravity, Urine 1.017 1.005 - 1.030  ? pH 5.0 5.0 - 8.0  ? Glucose, UA NEGATIVE NEGATIVE mg/dL  ? Hgb urine dipstick NEGATIVE NEGATIVE  ?  Bilirubin Urine NEGATIVE NEGATIVE  ? Ketones, ur 20 (A) NEGATIVE mg/dL  ? Protein, ur NEGATIVE NEGATIVE mg/dL  ? Nitrite NEGATIVE NEGATIVE  ? Leukocytes,Ua NEGATIVE NEGATIVE  ?Hepatitis panel, acute     Status: None  ? Collection Time: 06/30/21  4:51 PM  ?Result Value Ref Range  ? Hepatitis B Surface Ag NON REACTIVE NON REACTIVE  ? HCV Ab NON REACTIVE NON REACTIVE  ? Hep A IgM NON REACTIVE NON REACTIVE  ? Hep B C IgM NON REACTIVE NON REACTIVE  ?Resp Panel by RT-PCR (Flu A&B, Covid) Nasopharyngeal Swab     Status: None  ? Collection Time: 06/30/21  8:59 PM  ? Specimen: Nasopharyngeal Swab; Nasopharyngeal(NP) swabs in vial transport medium  ?Result Value Ref Range  ? SARS Coronavirus 2 by RT PCR NEGATIVE NEGATIVE  ? Influenza A by PCR NEGATIVE NEGATIVE  ? Influenza B by PCR NEGATIVE NEGATIVE  ?Ammonia     Status: None  ? Collection Time: 06/30/21  9:28 PM  ?Result Value Ref Range  ? Ammonia 22 9 - 35 umol/L  ?TSH     Status: None  ? Collection Time: 06/30/21  9:28 PM  ?Result Value Ref Range  ? TSH 1.033 0.350 - 4.500 uIU/mL  ?Lactic acid, plasma     Status: None  ? Collection Time: 06/30/21  9:28 PM  ?Result Value Ref Range  ? Lactic Acid, Venous 0.8 0.5 - 1.9 mmol/L  ?CK     Status: Abnormal  ? Collection Time: 06/30/21 10:05 PM  ?Result Value Ref Range  ? Total CK 34 (L) 49 - 397 U/L  ?Magnesium     Status: None  ? Collection Time: 06/30/21 10:05 PM  ?Result Value Ref Range  ? Magnesium 1.7 1.7 - 2.4 mg/dL  ?Phosphorus     Status: None  ? Collection Time: 06/30/21 10:05 PM  ?Result Value Ref Range  ? Phosphorus 2.7 2.5 - 4.6 mg/dL  ?Ethanol     Status: None  ? Collection  Time: 06/30/21 10:05 PM  ?Result Value Ref Range  ? Alcohol, Ethyl (B) <10 <10 mg/dL  ?Protime-INR     Status: None  ? Collection Time: 06/30/21 10:05 PM  ?Result Value Ref Range  ? Prothrombin Time 15.1 11.4 - 15.2 se

## 2021-07-01 NOTE — Consult Note (Addendum)
? ?                                            Consultation Note ? ? ?Referring Provider: [facility] ?PCP: [facility] ?Primary Gastroenterologist: Althia Forts ?Reason for consultation: Elevated liver tests. ?Hospital Day: 2 ? ?ASSESSMENT:  ? ?Nausea, vomiting, mid upper abdominal pain. Possibly gastroenteritis. CT scan shows possible pre-pyloric gastric wall thickening. MRI showing mild duodenal bulb and descending duodenal wall thickening  ? ?Acute diarrhea (started several days ago).  ?Could be infectious. There is actually moderate stool burden on CT scan.  ? ?Abnormal liver chemistries, mixed pattern but predominantly hepatocellular.  ?INR normal. Mentation normal  ?This seems unlikely to be biliary in nature based on CTAP /  MRI.  Possibly infectious?   ? ?HIV, ?Compliant with medication ? ?See PMH for additional medical problems ? ? ?PLAN:  ? ?Stool studies already ordered. Will make sure giardia and microsporida included in panel.  ?Acute Hepatitis panel negative ?Will obtain labs for CMV, EBV ?PPI BID.  ?Repeat liver tests in am   ?Eventual EGD to evaluate symptoms and also gastroduodenal findings on imaging.  ? ? ?Attending Physician Note  ? ?I have taken a history, reviewed the chart and examined the patient. I performed a substantive portion of this encounter, including complete performance of at least one of the key components, in conjunction with the APP. I agree with the APP's note, impression and recommendations with my edits. My additional impressions and recommendations are as follows.  ? ?56 yo male with HIV on treatment with remote history of MAI, candida esophagitis. He presents with 1 week of nausea, vomiting, diarrhea, epigastric pain. Elevated LFTs noted in ED. Vomiting has improved however other symptoms persist. Takes Naprosyn daily. CT AP and MRCP: S/P cholecystectomy, stable mild biliary dilation, no choledocholithiasis, prepyloric wall thickening, subtle  duodenal edema.   ? ?R/O infectious process. Stool studies pending.  ?Check EBV and CMV IgM ?Trend LFTs ?Hold NSAIDs ?Pantoprazole 40 mg po bid ?EGD in next few days ?Consider flex sig or colonoscopy if diarrhea persists without an etiology determined ? ?Lucio Edward, MD [facility] ?See [facility], [facility], for our on call provider  ? ? ? ?History of Present Illness:  ?Roy Trujillo is a 56 y.o. male with a past medical history significant for HIV, depression , etoh abuse, avascular necrosis, MAI ,bilateral inguinal hernia repair,  cholecystectomy.  See PMH for any additional medical problems. ? ?Patient presented to ED yesterday with abdominal pain, nausea and diarrhea Patient has occasional diarrhea but 8 days ago developed severe watery diarrhea and nausea.. Then a few days ago he began having non-radiating mid upper abdominal pain. The pain is constant and not really related to eating or fasting state. He has been taking Naprosyn everyday. No black stools or blood in stools.  ? ?He hasn't had any medication changes in months. No diet changes. No out of country travel. No recent antibiotics. No known contact with others with similar symptoms.  ? ? In ED liver chemistries were markedly elevated.( Mixed pattern but predominantly hepatocellular)..Alk pos 270, AST 356, ALT 378. Tbili 1.4. Lipase normal. CBC normal.  Liver tests were normal a year ago.  ? ?Today liver tests are slightly worse.  AST up to 543, ALT 437. Alk phos 310 and T bili 1.5.  ? ?CTAP w/ contrast >>  Suggestion of pre pyloric gastric wall thickening, can be seen with gastritis or peptic ulcer disease. Stable biliary dilatation, typically related to prior cholecystectomy.   ? ?MRI >>.Mild intrahepatic and extrahepatic biliary duct dilatation. No ?choledocholithiasis. No obstructing mass lesion evident. Common bile ?duct in the head of the pancreas is 11 mm diameter today in was 10 ?mm diameter on CT scan of 02/12/2018 suggesting no  substantial ?interval change. Features may reflect prior cholecystectomy. Subtle edema in the region of the duodenal bulb and descending ?duodenum, tracking into the hepatoduodenal ligament. Pancreas does ?not appear to be the epicenter of edema and duodenitis/peptic ulcer ?disease would be a consideration. ? ?Previous GI Evaluation / History   ? ?Normal colonoscopy in [place] a few year ago per patient ? ?Recent Labs and Imaging ?CT Abdomen Pelvis W Contrast ? ?Result Date: 06/30/2021 ?CLINICAL DATA:  Acute abdominal pain. EXAM: CT ABDOMEN AND PELVIS WITH CONTRAST TECHNIQUE: Multidetector CT imaging of the abdomen and pelvis was performed using the standard protocol following bolus administration of intravenous contrast. RADIATION DOSE REDUCTION: This exam was performed according to the departmental dose-optimization program which includes automated exposure control, adjustment of the mA and/or kV according to patient size and/or use of iterative reconstruction technique. CONTRAST:  125m OMNIPAQUE IOHEXOL 300 MG/ML  SOLN COMPARISON:  02/12/2018 FINDINGS: Lower chest: No acute airspace disease or pleural effusion. The heart is normal in size. Hepatobiliary: Mild decreased hepatic density consistent with steatosis. No focal liver lesion. Post cholecystectomy. Stable biliary dilatation with common bile duct measuring 12 mm proximally and 12 mm distally. No visible choledocholithiasis or obstructing lesion. Minimal central intrahepatic biliary ductal dilatation. Pancreas: No ductal dilatation or inflammation. No visible pancreatic mass. Spleen: Normal in size without focal abnormality. Adrenals/Urinary Tract: Normal adrenal glands. No hydronephrosis or perinephric edema. Homogeneous renal enhancement with symmetric excretion on delayed phase imaging. No renal stone or focal lesion. Urinary bladder is physiologically distended without wall thickening, intermittently obscured by streak artifact from hip arthroplasties.  Stomach/Bowel: The stomach is nondistended. There is suggestion of pre pyloric gastric wall thickening, series 3, image 25. No small bowel obstruction or inflammation. Enteric contrast seen to the colon. The appendix is normal. Moderate stool burden in the ascending and transverse colon no colonic wall thickening or pericolonic edema. Vascular/Lymphatic: Mild aortic atherosclerosis without aneurysm. The portal and splenic veins are patent. There is suggestion of mild paraesophageal and perigastric varices. No abdominopelvic adenopathy. Reproductive: Prostate is obscured by streak artifact from bilateral hip arthroplasties. Other: No free air, free fluid, or intra-abdominal fluid collection. No abdominal wall hernia. Musculoskeletal: Bilateral hip arthroplasties. There are no acute or suspicious osseous abnormalities. IMPRESSION: 1. Suggestion of pre pyloric gastric wall thickening, can be seen with gastritis or peptic ulcer disease. 2. Mild hepatic steatosis. 3. Stable biliary dilatation, typically related to prior cholecystectomy. Recommend correlation with LFTs. If elevated, consider further evaluation with MRCP. Aortic Atherosclerosis (ICD10-I70.0). Electronically Signed   By: MKeith RakeM.D.   On: 06/30/2021 18:44  ? ?MR ABDOMEN MRCP WO CONTRAST ? ?Result Date: 07/01/2021 ?CLINICAL DATA:  Abdominal pain and dilated bile ducts on CT imaging. EXAM: MRI ABDOMEN WITHOUT CONTRAST  (INCLUDING MRCP) TECHNIQUE: Multiplanar multisequence MR imaging of the abdomen was performed. Heavily T2-weighted images of the biliary and pancreatic ducts were obtained, and three-dimensional MRCP images were rendered by post processing. COMPARISON:  CT scan 06/30/2021 FINDINGS: Lower chest: Unremarkable Hepatobiliary: No focal abnormality within the liver. Mild intrahepatic biliary duct dilatation evident. Common duct in the  hepatoduodenal ligament is 12 mm diameter. Common bile duct in the head of the pancreas measures 11 mm  diameter. No obstructing mass lesion. No choledocholithiasis. Pancreas: No focal mass lesion. No dilatation of the main duct. No intraparenchymal cyst. No peripancreatic edema. Spleen:  No splenomegaly. No focal mass lesion. Adre

## 2021-07-01 NOTE — Assessment & Plan Note (Addendum)
-   Patient endorses to taking prescription strength naproxen BID for several months for ongoing pain in his hands  ?- also has some occasional etoh use and THC use ?- at risk for PUD given this usage ?- imaging shows edema involving duodenal bulb and descending duodenum.  Concerning for possible underlying duodenitis/PUD ?- GI following, plan is for EGD on 07/03/2021 ?

## 2021-07-01 NOTE — Assessment & Plan Note (Signed)
-   on Descovy and Tivicay with good control per patient ?- follows with HIV clinic in [place] ?- only prior labs are from 2018 and likely not accurate ?- hx of mild LFT elevation and the current elevation is not likely to be due to his HIV meds which should be uninterrupted unless necessary which I do not think is necessary at this time ?- continue both at this time ?

## 2021-07-01 NOTE — Assessment & Plan Note (Addendum)
-  Elevated AST/ALT on admission and now with worsening elevation along with increased alk phos and total bili. Has history of CCY and residual dilation expected plus MRCP negative for obstruction. Remaining differential for etiology would be a passed obstruction (stone vs sludge) vs possible med side effect ?- GI consulted on admission as well ?-Plan is for EGD on 07/03/2021 ?

## 2021-07-02 DIAGNOSIS — R1013 Epigastric pain: Secondary | ICD-10-CM

## 2021-07-02 LAB — COMPREHENSIVE METABOLIC PANEL
ALT: 394 U/L — ABNORMAL HIGH (ref 0–44)
AST: 334 U/L — ABNORMAL HIGH (ref 15–41)
Albumin: 3.4 g/dL — ABNORMAL LOW (ref 3.5–5.0)
Alkaline Phosphatase: 296 U/L — ABNORMAL HIGH (ref 38–126)
Anion gap: 5 (ref 5–15)
BUN: 6 mg/dL (ref 6–20)
CO2: 23 mmol/L (ref 22–32)
Calcium: 8.6 mg/dL — ABNORMAL LOW (ref 8.9–10.3)
Chloride: 109 mmol/L (ref 98–111)
Creatinine, Ser: 1 mg/dL (ref 0.61–1.24)
GFR, Estimated: >60 mL/min (ref >60)
Glucose, Bld: 92 mg/dL (ref 70–99)
Potassium: 4 mmol/L (ref 3.5–5.1)
Sodium: 137 mmol/L (ref 135–145)
Total Bilirubin: 1.6 mg/dL — ABNORMAL HIGH (ref 0.3–1.2)
Total Protein: 5.6 g/dL — ABNORMAL LOW (ref 6.5–8.1)

## 2021-07-02 LAB — CBC WITH DIFFERENTIAL/PLATELET
Abs Immature Granulocytes: 0.01 10*3/uL (ref 0.00–0.07)
Basophils Absolute: 0.1 10*3/uL (ref 0.0–0.1)
Basophils Relative: 2 %
Eosinophils Absolute: 0.4 10*3/uL (ref 0.0–0.5)
Eosinophils Relative: 10 %
HCT: 36.7 % — ABNORMAL LOW (ref 39.0–52.0)
Hemoglobin: 12.8 g/dL — ABNORMAL LOW (ref 13.0–17.0)
Immature Granulocytes: 0 %
Lymphocytes Relative: 33 %
Lymphs Abs: 1.1 10*3/uL (ref 0.7–4.0)
MCH: 33.5 pg (ref 26.0–34.0)
MCHC: 34.9 g/dL (ref 30.0–36.0)
MCV: 96.1 fL (ref 80.0–100.0)
Monocytes Absolute: 0.4 10*3/uL (ref 0.1–1.0)
Monocytes Relative: 13 %
Neutro Abs: 1.5 10*3/uL — ABNORMAL LOW (ref 1.7–7.7)
Neutrophils Relative %: 42 %
Platelets: 102 10*3/uL — ABNORMAL LOW (ref 150–400)
RBC: 3.82 MIL/uL — ABNORMAL LOW (ref 4.22–5.81)
RDW: 13.2 % (ref 11.5–15.5)
WBC: 3.5 10*3/uL — ABNORMAL LOW (ref 4.0–10.5)
nRBC: 0 % (ref 0.0–0.2)

## 2021-07-02 LAB — EPSTEIN-BARR VIRUS VCA, IGM: EBV VCA IgM: <36 U/mL (ref 0.0–35.9)

## 2021-07-02 LAB — MAGNESIUM: Magnesium: 1.9 mg/dL (ref 1.7–2.4)

## 2021-07-02 LAB — HIV-1 RNA QUANT-NO REFLEX-BLD
HIV 1 RNA Quant: <20 copies/mL
LOG10 HIV-1 RNA: UNDETERMINED log10copy/mL

## 2021-07-02 NOTE — Anesthesia Preprocedure Evaluation (Addendum)
Anesthesia Evaluation  ?Patient identified by MRN, date of birth, ID band ?Patient awake ? ? ? ?Reviewed: ?Allergy & Precautions, NPO status , Patient's Chart, lab work & pertinent test results ? ?History of Anesthesia Complications ?Negative for: history of anesthetic complications ? ?Airway ?Mallampati: II ? ?TM Distance: >3 FB ?Neck ROM: Full ? ? ? Dental ? ?(+) Missing,  ?  ?Pulmonary ?neg pulmonary ROS,  ?  ?Pulmonary exam normal ? ? ? ? ? ? ? Cardiovascular ?negative cardio ROS ?Normal cardiovascular exam ? ? ?  ?Neuro/Psych ?Anxiety Depression negative neurological ROS ?   ? GI/Hepatic ?GERD  ,(+)  ?  ? substance abuse ? marijuana use, Epigastric pain, nausea, vomiting, diarrhea ?  ?Endo/Other  ?negative endocrine ROS ? Renal/GU ?negative Renal ROS  ?negative genitourinary ?  ?Musculoskeletal ? ?(+) Arthritis , Rheumatoid disorders,   ? Abdominal ?  ?Peds ? Hematology ? ?(+) HIV, Hgb 12.8, Plt 102k   ?Anesthesia Other Findings ?Day of surgery medications reviewed with patient. ? Reproductive/Obstetrics ?negative OB ROS ? ?  ? ? ? ? ? ? ? ? ? ? ? ? ? ?  ?  ? ? ? ? ? ? ? ?Anesthesia Physical ?Anesthesia Plan ? ?ASA: 3 ? ?Anesthesia Plan: MAC  ? ?Post-op Pain Management: Minimal or no pain anticipated  ? ?Induction:  ? ?PONV Risk Score and Plan: Treatment may vary due to age or medical condition and Propofol infusion ? ?Airway Management Planned: Natural Airway and Nasal Cannula ? ?Additional Equipment: None ? ?Intra-op Plan:  ? ?Post-operative Plan:  ? ?Informed Consent: I have reviewed the patients History and Physical, chart, labs and discussed the procedure including the risks, benefits and alternatives for the proposed anesthesia with the patient or authorized representative who has indicated his/her understanding and acceptance.  ? ? ? ? ? ?Plan Discussed with: CRNA ? ?Anesthesia Plan Comments:   ? ? ? ? ? ?Anesthesia Quick Evaluation ? ?

## 2021-07-02 NOTE — Progress Notes (Signed)
?  Transition of Care (TOC) Screening Note ? ? ?Patient Details  ?Name: Roy Trujillo ?Date of Birth: [birth year] ? ? ?Transition of Care (TOC) CM/SW Contact:    ?Delilah Shan, LCSWA ?Phone Number: ?07/02/2021, 5:08 PM ? ? ? ?Transition of Care Department [facility]) has reviewed patient and no TOC needs have been identified at this time. We will continue to monitor patient advancement through interdisciplinary progression rounds. If new patient transition needs arise, please place a TOC consult. ?  ?

## 2021-07-02 NOTE — Progress Notes (Signed)
?Progress Note ? ? ? ?Roy Trujillo   ?JSE:[id number]  ?DOB: [birth year]  ?DOA: 06/30/2021     1 ?PCP: [facility] ? ?Initial CC: diarrhea ? ?Hospital Course: ?Roy Trujillo is a 56 yo male with PMH depression/anxiety, HIV, GERD who presented with abdominal pain.  ?He was found to have elevated LFTs concerning for possible obstruction although does have a history of cholecystectomy approximately 5 years ago.  He underwent CT abdomen/pelvis which showed prepyloric gastric wall thickening and stable biliary dilatation.  He then underwent MRCP which showed mild intra and extrahepatic biliary ductal dilatation, no choledocholithiasis and no obstructing mass.  CBD dilated at 11 mm similar to prior dilation seen in 2019.  Also subtle edema noted along the duodenal bulb and descending duodenum tracking into hepatoduodenal ligament. ? ?With further collateral information he reports that he has been on twice daily naproxen for the past several months due to pains with his hands.  He also endorses occasional EtOH use but moreso rare (had a 40oz about 1 month ago, but endorses a history of "alcoholism").  Smokes marijuana intermittently. ? ?He was admitted for further work-up and evaluation by GI. ? ?Interval History:  ?No events overnight.  No further diarrhea or any bowel movement for that matter.  Pain is still present but improved some.  No significant nausea or vomiting. ? ?Assessment and Plan: ?* Diarrhea-resolved as of 07/02/2021 ?- differential includes infectious vs intermittent from HIV meds side effects vs due to ulcerations ?-He has not had any diarrhea since admission.  Stool studies canceled per GI as well as contact precautions ? ?Elevated LFTs ?- Elevated AST/ALT on admission and now with worsening elevation along with increased alk phos and total bili. Has history of CCY and residual dilation expected plus MRCP negative for obstruction. Remaining differential for etiology would be  a passed obstruction (stone vs sludge) vs possible med side effect ?- GI consulted on admission as well ?-Plan is for EGD on 07/03/2021 ? ?NSAID long-term use ?- Patient endorses to taking prescription strength naproxen BID for several months for ongoing pain in his hands  ?- also has some occasional etoh use and THC use ?- at risk for PUD given this usage ?- imaging shows edema involving duodenal bulb and descending duodenum.  Concerning for possible underlying duodenitis/PUD ?- GI following, plan is for EGD on 07/03/2021 ? ?Neuropathy ?- Continue gabapentin ? ?HIV infection ([place]) ?- on Descovy and Tivicay with good control per patient ?- follows with HIV clinic in [place] ?- only prior labs are from 2018 and likely not accurate ?- hx of mild LFT elevation and the current elevation is not likely to be due to his HIV meds which should be uninterrupted unless necessary which I do not think is necessary at this time ?- continue both at this time ? ? ?Old records reviewed in assessment of this patient ? ?Antimicrobials: ? ? ?DVT prophylaxis:  ?SCDs Start: 06/30/21 2200 ? ? ?Code Status:   Code Status: Full Code ? ?Disposition Plan:  Home Tues or Wed ?Status is: Inpt ? ?Objective: ?Blood pressure 112/61, pulse (!) 56, temperature 97.9 ?F (36.6 ?C), temperature source Oral, resp. rate 18, height _0  (1.676 m), weight 55.2 kg, SpO2 98 %.  ?Examination:  ?Physical Exam ?Constitutional:   ?   General: He is not in acute distress. ?   Appearance: Normal appearance.  ?HENT:  ?   Head: Normocephalic and atraumatic.  ?   Mouth/Throat:  ?  Mouth: Mucous membranes are moist.  ?Eyes:  ?   Extraocular Movements: Extraocular movements intact.  ?Cardiovascular:  ?   Rate and Rhythm: Normal rate and regular rhythm.  ?   Heart sounds: Normal heart sounds.  ?Pulmonary:  ?   Effort: Pulmonary effort is normal. No respiratory distress.  ?   Breath sounds: Normal breath sounds. No wheezing.  ?Abdominal:  ?   General: Bowel sounds are  normal.  ?   Palpations: Abdomen is soft.  ?   Comments: Mild abdominal pain noted worse in epigastrium with no rebound or guarding.  Bowel sounds present.  ?Musculoskeletal:     ?   General: Normal range of motion.  ?   Cervical back: Normal range of motion and neck supple.  ?Skin: ?   General: Skin is warm and dry.  ?Neurological:  ?   General: No focal deficit present.  ?   Mental Status: He is alert.  ?Psychiatric:     ?   Mood and Affect: Mood normal.     ?   Behavior: Behavior normal.  ?  ? ?Consultants:  ?GI ? ?Procedures:  ? ? ?Data Reviewed: ?Results for orders placed or performed during the hospital encounter of 06/30/21 (from the past 24 hour(s))  ?Comprehensive metabolic panel     Status: Abnormal  ? Collection Time: 07/02/21  4:14 AM  ?Result Value Ref Range  ? Sodium 137 135 - 145 mmol/L  ? Potassium 4.0 3.5 - 5.1 mmol/L  ? Chloride 109 98 - 111 mmol/L  ? CO2 23 22 - 32 mmol/L  ? Glucose, Bld 92 70 - 99 mg/dL  ? BUN 6 6 - 20 mg/dL  ? Creatinine, Ser 1.00 0.61 - 1.24 mg/dL  ? Calcium 8.6 (L) 8.9 - 10.3 mg/dL  ? Total Protein 5.6 (L) 6.5 - 8.1 g/dL  ? Albumin 3.4 (L) 3.5 - 5.0 g/dL  ? AST 334 (H) 15 - 41 U/L  ? ALT 394 (H) 0 - 44 U/L  ? Alkaline Phosphatase 296 (H) 38 - 126 U/L  ? Total Bilirubin 1.6 (H) 0.3 - 1.2 mg/dL  ? GFR, Estimated >60 >60 mL/min  ? Anion gap 5 5 - 15  ?CBC with Differential/Platelet     Status: Abnormal  ? Collection Time: 07/02/21  4:14 AM  ?Result Value Ref Range  ? WBC 3.5 (L) 4.0 - 10.5 K/uL  ? RBC 3.82 (L) 4.22 - 5.81 MIL/uL  ? Hemoglobin 12.8 (L) 13.0 - 17.0 g/dL  ? HCT 36.7 (L) 39.0 - 52.0 %  ? MCV 96.1 80.0 - 100.0 fL  ? MCH 33.5 26.0 - 34.0 pg  ? MCHC 34.9 30.0 - 36.0 g/dL  ? RDW 13.2 11.5 - 15.5 %  ? Platelets 102 (L) 150 - 400 K/uL  ? nRBC 0.0 0.0 - 0.2 %  ? Neutrophils Relative % 42 %  ? Neutro Abs 1.5 (L) 1.7 - 7.7 K/uL  ? Lymphocytes Relative 33 %  ? Lymphs Abs 1.1 0.7 - 4.0 K/uL  ? Monocytes Relative 13 %  ? Monocytes Absolute 0.4 0.1 - 1.0 K/uL  ? Eosinophils  Relative 10 %  ? Eosinophils Absolute 0.4 0.0 - 0.5 K/uL  ? Basophils Relative 2 %  ? Basophils Absolute 0.1 0.0 - 0.1 K/uL  ? Immature Granulocytes 0 %  ? Abs Immature Granulocytes 0.01 0.00 - 0.07 K/uL  ?Magnesium     Status: None  ? Collection Time: 07/02/21  4:14 AM  ?Result  Value Ref Range  ? Magnesium 1.9 1.7 - 2.4 mg/dL  ?  ?I have Reviewed nursing notes, Vitals, and Lab results since pt's last encounter. Pertinent lab results : see above ?I have ordered test including BMP, CBC, Mg ?I have reviewed the last note from staff over past 24 hours ?I have discussed pt's care plan and test results with nursing staff, case manager ? ? LOS: 1 day  ? ?Dwyane Dee, MD ?[facility] ?07/02/2021, 3:21 PM ? ?

## 2021-07-02 NOTE — H&P (View-Only) (Signed)
? ?       Daily Rounding Note ? ?07/02/2021, 1:38 PM ? LOS: 1 day  ? ?SUBJECTIVE:   ?Chief complaint:   N/V/D, epigastric pain.  Elevated LFTs ? ?Patient has not had a bowel movement since yesterday morning.  No nausea, tolerating clear liquids and hungry to try more substantial food.  Epigastric pain lingers but much improved since admission. ? ?OBJECTIVE:        ? Vital signs in last 24 hours:    ?Temp:  [97.9 ?F (36.6 ?C)-98.7 ?F (37.1 ?C)] 97.9 ?F (36.6 ?C) (05/01 0439) ?Pulse Rate:  [56-61] 56 (05/01 0439) ?BP: (106-112)/(58-61) 112/61 (05/01 0439) ?SpO2:  [98 %-99 %] 98 % (05/01 0439) ?Last BM Date : 07/01/21 ?Filed Weights  ? 07/01/21 0257  ?Weight: 55.2 kg  ? ?General: Patient looks well.  On the thin side but not cachectic ?Heart: RRR ?Chest: No labored breathing ?Abdomen: Soft.  Minor epigastric tenderness no guarding or rebound.  Bowel sounds active.  No distention ?Extremities: No CCE ?Neuro/Psych: Calm, pleasant, cooperative.  Fluid speech, engaged conversationalist. ? ?Intake/Output from previous day: ?No intake/output data recorded. ? ?Intake/Output this shift: ?No intake/output data recorded. ? ?Lab Results: ?Recent Labs  ?  06/30/21 ?1533 07/01/21 ?3664 07/02/21 ?0414  ?WBC 6.5 5.3 3.5*  ?HGB 16.3 14.3 12.8*  ?HCT 46.6 41.3 36.7*  ?PLT PLATELET CLUMPS NOTED ON SMEAR, UNABLE TO ESTIMATE 88* 102*  ? ?BMET ?Recent Labs  ?  06/30/21 ?1533 07/01/21 ?4034 07/02/21 ?0414  ?NA 139 140 137  ?K 3.7 3.9 4.0  ?CL 107 108 109  ?CO2 21* 25 23  ?GLUCOSE 93 97 92  ?BUN $Rem'10 9 6  'UQyp$ ?CREATININE 0.97 1.04 1.00  ?CALCIUM 9.2 8.9 8.6*  ? ?LFT ?Recent Labs  ?  06/30/21 ?1533 07/01/21 ?7425 07/02/21 ?0414  ?PROT 7.2 6.3* 5.6*  ?ALBUMIN 4.5 3.9 3.4*  ?AST 356* 543* 334*  ?ALT 378* 437* 394*  ?ALKPHOS 270* 310* 296*  ?BILITOT 1.4* 1.5* 1.6*  ? ?PT/INR ?Recent Labs  ?  06/30/21 ?2205 07/01/21 ?0315  ?LABPROT 15.1 12.9  ?INR 1.2 1.0  ? ?Hepatitis Panel ?Recent Labs  ?   06/30/21 ?1651  ?HEPBSAG NON REACTIVE  ?HCVAB NON REACTIVE  ?HEPAIGM NON REACTIVE  ?HEPBIGM NON REACTIVE  ? ? ?Studies/Results: ?CT Abdomen Pelvis W Contrast ? ?Result Date: 06/30/2021 ?CLINICAL DATA:  Acute abdominal pain. EXAM: CT ABDOMEN AND PELVIS WITH CONTRAST TECHNIQUE: Multidetector CT imaging of the abdomen and pelvis was performed using the standard protocol following bolus administration of intravenous contrast. RADIATION DOSE REDUCTION: This exam was performed according to the departmental dose-optimization program which includes automated exposure control, adjustment of the mA and/or kV according to patient size and/or use of iterative reconstruction technique. CONTRAST:  123mL OMNIPAQUE IOHEXOL 300 MG/ML  SOLN COMPARISON:  02/12/2018 FINDINGS: Lower chest: No acute airspace disease or pleural effusion. The heart is normal in size. Hepatobiliary: Mild decreased hepatic density consistent with steatosis. No focal liver lesion. Post cholecystectomy. Stable biliary dilatation with common bile duct measuring 12 mm proximally and 12 mm distally. No visible choledocholithiasis or obstructing lesion. Minimal central intrahepatic biliary ductal dilatation. Pancreas: No ductal dilatation or inflammation. No visible pancreatic mass. Spleen: Normal in size without focal abnormality. Adrenals/Urinary Tract: Normal adrenal glands. No hydronephrosis or perinephric edema. Homogeneous renal enhancement with symmetric excretion on delayed phase imaging. No renal stone or focal lesion. Urinary bladder is physiologically distended without wall thickening, intermittently obscured by streak artifact from hip arthroplasties. Stomach/Bowel:  The stomach is nondistended. There is suggestion of pre pyloric gastric wall thickening, series 3, image 25. No small bowel obstruction or inflammation. Enteric contrast seen to the colon. The appendix is normal. Moderate stool burden in the ascending and transverse colon no colonic wall  thickening or pericolonic edema. Vascular/Lymphatic: Mild aortic atherosclerosis without aneurysm. The portal and splenic veins are patent. There is suggestion of mild paraesophageal and perigastric varices. No abdominopelvic adenopathy. Reproductive: Prostate is obscured by streak artifact from bilateral hip arthroplasties. Other: No free air, free fluid, or intra-abdominal fluid collection. No abdominal wall hernia. Musculoskeletal: Bilateral hip arthroplasties. There are no acute or suspicious osseous abnormalities. IMPRESSION: 1. Suggestion of pre pyloric gastric wall thickening, can be seen with gastritis or peptic ulcer disease. 2. Mild hepatic steatosis. 3. Stable biliary dilatation, typically related to prior cholecystectomy. Recommend correlation with LFTs. If elevated, consider further evaluation with MRCP. Aortic Atherosclerosis (ICD10-I70.0). Electronically Signed   By: Keith Rake M.D.   On: 06/30/2021 18:44  ? ?MR ABDOMEN MRCP WO CONTRAST ? ?Result Date: 07/01/2021 ?CLINICAL DATA:  Abdominal pain and dilated bile ducts on CT imaging. EXAM: MRI ABDOMEN WITHOUT CONTRAST  (INCLUDING MRCP) TECHNIQUE: Multiplanar multisequence MR imaging of the abdomen was performed. Heavily T2-weighted images of the biliary and pancreatic ducts were obtained, and three-dimensional MRCP images were rendered by post processing. COMPARISON:  CT scan 06/30/2021 FINDINGS: Lower chest: Unremarkable Hepatobiliary: No focal abnormality within the liver. Mild intrahepatic biliary duct dilatation evident. Common duct in the hepatoduodenal ligament is 12 mm diameter. Common bile duct in the head of the pancreas measures 11 mm diameter. No obstructing mass lesion. No choledocholithiasis. Pancreas: No focal mass lesion. No dilatation of the main duct. No intraparenchymal cyst. No peripancreatic edema. Spleen:  No splenomegaly. No focal mass lesion. Adrenals/Urinary Tract: No adrenal nodule or mass. Kidneys unremarkable.  Stomach/Bowel: Stomach is unremarkable. No gastric wall thickening. No evidence of outlet obstruction. Subtle edema is seen in the region of the duodenal bulb and descending duodenum, tracking into the hepatoduodenal ligament. No small bowel or colonic dilatation within the visualized abdomen. Vascular/Lymphatic: No abdominal aortic aneurysm. No abdominal lymphadenopathy Other:  No intraperitoneal free fluid. Musculoskeletal: No suspicious marrow signal abnormality. IMPRESSION: 1. Mild intrahepatic and extrahepatic biliary duct dilatation. No choledocholithiasis. No obstructing mass lesion evident. Common bile duct in the head of the pancreas is 11 mm diameter today in was 10 mm diameter on CT scan of 02/12/2018 suggesting no substantial interval change. Features may reflect prior cholecystectomy. 2. Subtle edema in the region of the duodenal bulb and descending duodenum, tracking into the hepatoduodenal ligament. Pancreas does not appear to be the epicenter of edema and duodenitis/peptic ulcer disease would be a consideration. Electronically Signed   By: Misty Stanley M.D.   On: 07/01/2021 09:32   ? ?Scheduled Meds: ? dolutegravir  50 mg Oral QHS  ? emtricitabine-tenofovir AF  1 tablet Oral QHS  ? gabapentin  300 mg Oral TID  ? mirtazapine  30 mg Oral QHS  ? pantoprazole  40 mg Oral BID  ? traZODone  200 mg Oral QHS  ? ?Continuous Infusions: ? sodium chloride    ? ?PRN Meds:.sodium chloride, fentaNYL (SUBLIMAZE) injection, HYDROcodone-acetaminophen, ondansetron **OR** ondansetron (ZOFRAN) IV ? ?ASSESMENT:  ? ?Abnormal LFTs.  These are improving but alk phos and transaminases still significantly elevated ?Nausea, vomiting, diarrhea, epigastric pain have all improved. ?CTAP shows thickening in the wall in the prepyloric gastric region.  Mild hepatic steatosis.  Stable CBD  at 12 mm, likely normal post cholecystectomy, with mild intrahepatic biliary ductal dilatation.  MRCP shows mild intra and extrahepatic biliary  ductal dilatation without choledocholithiasis or obstructing mass.  CBD 11 mm diameter, may reflect his previous cholecystectomy.  Subtle edema at region of duodenal bulb, descending duodenum tracking to hepatoduodenal ligament

## 2021-07-02 NOTE — Progress Notes (Addendum)
? ?       Daily Rounding Note ? ?07/02/2021, 1:38 PM ? LOS: 1 day  ? ?SUBJECTIVE:   ?Chief complaint:   N/V/D, epigastric pain.  Elevated LFTs ? ?Patient has not had a bowel movement since yesterday morning.  No nausea, tolerating clear liquids and hungry to try more substantial food.  Epigastric pain lingers but much improved since admission. ? ?OBJECTIVE:        ? Vital signs in last 24 hours:    ?Temp:  [97.9 ?F (36.6 ?C)-98.7 ?F (37.1 ?C)] 97.9 ?F (36.6 ?C) (05/01 0439) ?Pulse Rate:  [56-61] 56 (05/01 0439) ?BP: (106-112)/(58-61) 112/61 (05/01 0439) ?SpO2:  [98 %-99 %] 98 % (05/01 0439) ?Last BM Date : 07/01/21 ?Filed Weights  ? 07/01/21 0257  ?Weight: 55.2 kg  ? ?General: Patient looks well.  On the thin side but not cachectic ?Heart: RRR ?Chest: No labored breathing ?Abdomen: Soft.  Minor epigastric tenderness no guarding or rebound.  Bowel sounds active.  No distention ?Extremities: No CCE ?Neuro/Psych: Calm, pleasant, cooperative.  Fluid speech, engaged conversationalist. ? ?Intake/Output from previous day: ?No intake/output data recorded. ? ?Intake/Output this shift: ?No intake/output data recorded. ? ?Lab Results: ?Recent Labs  ?  06/30/21 ?1533 07/01/21 ?1157 07/02/21 ?0414  ?WBC 6.5 5.3 3.5*  ?HGB 16.3 14.3 12.8*  ?HCT 46.6 41.3 36.7*  ?PLT PLATELET CLUMPS NOTED ON SMEAR, UNABLE TO ESTIMATE 88* 102*  ? ?BMET ?Recent Labs  ?  06/30/21 ?1533 07/01/21 ?2620 07/02/21 ?0414  ?NA 139 140 137  ?K 3.7 3.9 4.0  ?CL 107 108 109  ?CO2 21* 25 23  ?GLUCOSE 93 97 92  ?BUN $Rem'10 9 6  'RIta$ ?CREATININE 0.97 1.04 1.00  ?CALCIUM 9.2 8.9 8.6*  ? ?LFT ?Recent Labs  ?  06/30/21 ?1533 07/01/21 ?3559 07/02/21 ?0414  ?PROT 7.2 6.3* 5.6*  ?ALBUMIN 4.5 3.9 3.4*  ?AST 356* 543* 334*  ?ALT 378* 437* 394*  ?ALKPHOS 270* 310* 296*  ?BILITOT 1.4* 1.5* 1.6*  ? ?PT/INR ?Recent Labs  ?  06/30/21 ?2205 07/01/21 ?0315  ?LABPROT 15.1 12.9  ?INR 1.2 1.0  ? ?Hepatitis Panel ?Recent Labs  ?   06/30/21 ?1651  ?HEPBSAG NON REACTIVE  ?HCVAB NON REACTIVE  ?HEPAIGM NON REACTIVE  ?HEPBIGM NON REACTIVE  ? ? ?Studies/Results: ?CT Abdomen Pelvis W Contrast ? ?Result Date: 06/30/2021 ?CLINICAL DATA:  Acute abdominal pain. EXAM: CT ABDOMEN AND PELVIS WITH CONTRAST TECHNIQUE: Multidetector CT imaging of the abdomen and pelvis was performed using the standard protocol following bolus administration of intravenous contrast. RADIATION DOSE REDUCTION: This exam was performed according to the departmental dose-optimization program which includes automated exposure control, adjustment of the mA and/or kV according to patient size and/or use of iterative reconstruction technique. CONTRAST:  137mL OMNIPAQUE IOHEXOL 300 MG/ML  SOLN COMPARISON:  02/12/2018 FINDINGS: Lower chest: No acute airspace disease or pleural effusion. The heart is normal in size. Hepatobiliary: Mild decreased hepatic density consistent with steatosis. No focal liver lesion. Post cholecystectomy. Stable biliary dilatation with common bile duct measuring 12 mm proximally and 12 mm distally. No visible choledocholithiasis or obstructing lesion. Minimal central intrahepatic biliary ductal dilatation. Pancreas: No ductal dilatation or inflammation. No visible pancreatic mass. Spleen: Normal in size without focal abnormality. Adrenals/Urinary Tract: Normal adrenal glands. No hydronephrosis or perinephric edema. Homogeneous renal enhancement with symmetric excretion on delayed phase imaging. No renal stone or focal lesion. Urinary bladder is physiologically distended without wall thickening, intermittently obscured by streak artifact from hip arthroplasties. Stomach/Bowel:  The stomach is nondistended. There is suggestion of pre pyloric gastric wall thickening, series 3, image 25. No small bowel obstruction or inflammation. Enteric contrast seen to the colon. The appendix is normal. Moderate stool burden in the ascending and transverse colon no colonic wall  thickening or pericolonic edema. Vascular/Lymphatic: Mild aortic atherosclerosis without aneurysm. The portal and splenic veins are patent. There is suggestion of mild paraesophageal and perigastric varices. No abdominopelvic adenopathy. Reproductive: Prostate is obscured by streak artifact from bilateral hip arthroplasties. Other: No free air, free fluid, or intra-abdominal fluid collection. No abdominal wall hernia. Musculoskeletal: Bilateral hip arthroplasties. There are no acute or suspicious osseous abnormalities. IMPRESSION: 1. Suggestion of pre pyloric gastric wall thickening, can be seen with gastritis or peptic ulcer disease. 2. Mild hepatic steatosis. 3. Stable biliary dilatation, typically related to prior cholecystectomy. Recommend correlation with LFTs. If elevated, consider further evaluation with MRCP. Aortic Atherosclerosis (ICD10-I70.0). Electronically Signed   By: Keith Rake M.D.   On: 06/30/2021 18:44  ? ?MR ABDOMEN MRCP WO CONTRAST ? ?Result Date: 07/01/2021 ?CLINICAL DATA:  Abdominal pain and dilated bile ducts on CT imaging. EXAM: MRI ABDOMEN WITHOUT CONTRAST  (INCLUDING MRCP) TECHNIQUE: Multiplanar multisequence MR imaging of the abdomen was performed. Heavily T2-weighted images of the biliary and pancreatic ducts were obtained, and three-dimensional MRCP images were rendered by post processing. COMPARISON:  CT scan 06/30/2021 FINDINGS: Lower chest: Unremarkable Hepatobiliary: No focal abnormality within the liver. Mild intrahepatic biliary duct dilatation evident. Common duct in the hepatoduodenal ligament is 12 mm diameter. Common bile duct in the head of the pancreas measures 11 mm diameter. No obstructing mass lesion. No choledocholithiasis. Pancreas: No focal mass lesion. No dilatation of the main duct. No intraparenchymal cyst. No peripancreatic edema. Spleen:  No splenomegaly. No focal mass lesion. Adrenals/Urinary Tract: No adrenal nodule or mass. Kidneys unremarkable.  Stomach/Bowel: Stomach is unremarkable. No gastric wall thickening. No evidence of outlet obstruction. Subtle edema is seen in the region of the duodenal bulb and descending duodenum, tracking into the hepatoduodenal ligament. No small bowel or colonic dilatation within the visualized abdomen. Vascular/Lymphatic: No abdominal aortic aneurysm. No abdominal lymphadenopathy Other:  No intraperitoneal free fluid. Musculoskeletal: No suspicious marrow signal abnormality. IMPRESSION: 1. Mild intrahepatic and extrahepatic biliary duct dilatation. No choledocholithiasis. No obstructing mass lesion evident. Common bile duct in the head of the pancreas is 11 mm diameter today in was 10 mm diameter on CT scan of 02/12/2018 suggesting no substantial interval change. Features may reflect prior cholecystectomy. 2. Subtle edema in the region of the duodenal bulb and descending duodenum, tracking into the hepatoduodenal ligament. Pancreas does not appear to be the epicenter of edema and duodenitis/peptic ulcer disease would be a consideration. Electronically Signed   By: Misty Stanley M.D.   On: 07/01/2021 09:32   ? ?Scheduled Meds: ? dolutegravir  50 mg Oral QHS  ? emtricitabine-tenofovir AF  1 tablet Oral QHS  ? gabapentin  300 mg Oral TID  ? mirtazapine  30 mg Oral QHS  ? pantoprazole  40 mg Oral BID  ? traZODone  200 mg Oral QHS  ? ?Continuous Infusions: ? sodium chloride    ? ?PRN Meds:.sodium chloride, fentaNYL (SUBLIMAZE) injection, HYDROcodone-acetaminophen, ondansetron **OR** ondansetron (ZOFRAN) IV ? ?ASSESMENT:  ? ?Abnormal LFTs.  These are improving but alk phos and transaminases still significantly elevated ?Nausea, vomiting, diarrhea, epigastric pain have all improved. ?CTAP shows thickening in the wall in the prepyloric gastric region.  Mild hepatic steatosis.  Stable CBD  at 12 mm, likely normal post cholecystectomy, with mild intrahepatic biliary ductal dilatation.  MRCP shows mild intra and extrahepatic biliary  ductal dilatation without choledocholithiasis or obstructing mass.  CBD 11 mm diameter, may reflect his previous cholecystectomy.  Subtle edema at region of duodenal bulb, descending duodenum tracking to hepatoduodenal ligament

## 2021-07-03 ENCOUNTER — Inpatient Hospital Stay (HOSPITAL_COMMUNITY): Payer: Medicare Other | Admitting: Anesthesiology

## 2021-07-03 ENCOUNTER — Encounter (HOSPITAL_COMMUNITY)
Admission: EM | Disposition: A | Discharge status: Home / Self Care | Payer: Self-pay | Source: Home / Self Care | Attending: Internal Medicine

## 2021-07-03 ENCOUNTER — Encounter (HOSPITAL_COMMUNITY): Payer: Self-pay | Admitting: Internal Medicine

## 2021-07-03 ENCOUNTER — Telehealth: Payer: Self-pay

## 2021-07-03 ENCOUNTER — Other Ambulatory Visit: Payer: Self-pay

## 2021-07-03 DIAGNOSIS — R7989 Other specified abnormal findings of blood chemistry: Secondary | ICD-10-CM

## 2021-07-03 DIAGNOSIS — R933 Abnormal findings on diagnostic imaging of other parts of digestive tract: Secondary | ICD-10-CM

## 2021-07-03 DIAGNOSIS — K3189 Other diseases of stomach and duodenum: Secondary | ICD-10-CM

## 2021-07-03 DIAGNOSIS — K269 Duodenal ulcer, unspecified as acute or chronic, without hemorrhage or perforation: Principal | ICD-10-CM

## 2021-07-03 DIAGNOSIS — F418 Other specified anxiety disorders: Secondary | ICD-10-CM

## 2021-07-03 DIAGNOSIS — K449 Diaphragmatic hernia without obstruction or gangrene: Secondary | ICD-10-CM

## 2021-07-03 DIAGNOSIS — K279 Peptic ulcer, site unspecified, unspecified as acute or chronic, without hemorrhage or perforation: Secondary | ICD-10-CM

## 2021-07-03 DIAGNOSIS — K2970 Gastritis, unspecified, without bleeding: Secondary | ICD-10-CM

## 2021-07-03 DIAGNOSIS — K259 Gastric ulcer, unspecified as acute or chronic, without hemorrhage or perforation: Secondary | ICD-10-CM

## 2021-07-03 DIAGNOSIS — K253 Acute gastric ulcer without hemorrhage or perforation: Secondary | ICD-10-CM

## 2021-07-03 DIAGNOSIS — T39395A Adverse effect of other nonsteroidal anti-inflammatory drugs [NSAID], initial encounter: Secondary | ICD-10-CM

## 2021-07-03 HISTORY — PX: ESOPHAGOGASTRODUODENOSCOPY (EGD) WITH PROPOFOL: SHX5813

## 2021-07-03 HISTORY — PX: BIOPSY: SHX5522

## 2021-07-03 LAB — CBC WITH DIFFERENTIAL/PLATELET
Abs Immature Granulocytes: 0.01 10*3/uL (ref 0.00–0.07)
Basophils Absolute: 0.1 10*3/uL (ref 0.0–0.1)
Basophils Relative: 1 %
Eosinophils Absolute: 0.3 10*3/uL (ref 0.0–0.5)
Eosinophils Relative: 10 %
HCT: 36.8 % — ABNORMAL LOW (ref 39.0–52.0)
Hemoglobin: 12.7 g/dL — ABNORMAL LOW (ref 13.0–17.0)
Immature Granulocytes: 0 %
Lymphocytes Relative: 30 %
Lymphs Abs: 1.1 10*3/uL (ref 0.7–4.0)
MCH: 33.3 pg (ref 26.0–34.0)
MCHC: 34.5 g/dL (ref 30.0–36.0)
MCV: 96.6 fL (ref 80.0–100.0)
Monocytes Absolute: 0.4 10*3/uL (ref 0.1–1.0)
Monocytes Relative: 10 %
Neutro Abs: 1.7 10*3/uL (ref 1.7–7.7)
Neutrophils Relative %: 49 %
Platelets: 95 10*3/uL — ABNORMAL LOW (ref 150–400)
RBC: 3.81 MIL/uL — ABNORMAL LOW (ref 4.22–5.81)
RDW: 13.1 % (ref 11.5–15.5)
WBC: 3.6 10*3/uL — ABNORMAL LOW (ref 4.0–10.5)
nRBC: 0 % (ref 0.0–0.2)

## 2021-07-03 LAB — COMPREHENSIVE METABOLIC PANEL
ALT: 336 U/L — ABNORMAL HIGH (ref 0–44)
AST: 219 U/L — ABNORMAL HIGH (ref 15–41)
Albumin: 3.3 g/dL — ABNORMAL LOW (ref 3.5–5.0)
Alkaline Phosphatase: 310 U/L — ABNORMAL HIGH (ref 38–126)
Anion gap: 5 (ref 5–15)
BUN: 6 mg/dL (ref 6–20)
CO2: 24 mmol/L (ref 22–32)
Calcium: 8.7 mg/dL — ABNORMAL LOW (ref 8.9–10.3)
Chloride: 111 mmol/L (ref 98–111)
Creatinine, Ser: 0.93 mg/dL (ref 0.61–1.24)
GFR, Estimated: >60 mL/min (ref >60)
Glucose, Bld: 94 mg/dL (ref 70–99)
Potassium: 3.8 mmol/L (ref 3.5–5.1)
Sodium: 140 mmol/L (ref 135–145)
Total Bilirubin: 1 mg/dL (ref 0.3–1.2)
Total Protein: 5.4 g/dL — ABNORMAL LOW (ref 6.5–8.1)

## 2021-07-03 LAB — HSV(HERPES SIMPLEX VRS) I + II AB-IGG
HSV 1 Glycoprotein G Ab, IgG: 1.1 index — ABNORMAL HIGH (ref 0.00–0.90)
HSV 2 Glycoprotein G Ab, IgG: 18.6 index — ABNORMAL HIGH (ref 0.00–0.90)

## 2021-07-03 LAB — CK: Total CK: 39 U/L — ABNORMAL LOW (ref 49–397)

## 2021-07-03 LAB — MAGNESIUM: Magnesium: 1.8 mg/dL (ref 1.7–2.4)

## 2021-07-03 LAB — CMV IGM: CMV IgM: <30 AU/mL (ref 0.0–29.9)

## 2021-07-03 SURGERY — ESOPHAGOGASTRODUODENOSCOPY (EGD) WITH PROPOFOL
Anesthesia: Monitor Anesthesia Care

## 2021-07-03 MED ORDER — LACTATED RINGERS IV SOLN: INTRAVENOUS | Status: DC | PRN

## 2021-07-03 MED ORDER — PROPOFOL 10 MG/ML IV BOLUS
INTRAVENOUS | Status: DC | PRN
  Administered 2021-07-03 (×2): 10 mg via INTRAVENOUS
  Administered 2021-07-03 (×2): 20 mg via INTRAVENOUS

## 2021-07-03 MED ORDER — PROPOFOL 500 MG/50ML IV EMUL
INTRAVENOUS | Status: DC | PRN
  Administered 2021-07-03: 200 ug/kg/min via INTRAVENOUS

## 2021-07-03 MED ORDER — PANTOPRAZOLE SODIUM 40 MG PO TBEC: 40.0000 mg | DELAYED_RELEASE_TABLET | Freq: Two times a day (BID) | ORAL | 0 refills | Status: AC

## 2021-07-03 MED ORDER — PHENYLEPHRINE 80 MCG/ML (10ML) SYRINGE FOR IV PUSH (FOR BLOOD PRESSURE SUPPORT)
PREFILLED_SYRINGE | INTRAVENOUS | Status: DC | PRN
  Administered 2021-07-03: 80 ug via INTRAVENOUS

## 2021-07-03 SURGICAL SUPPLY — 15 items

## 2021-07-03 NOTE — Anesthesia Procedure Notes (Signed)
Procedure Name: [place] ?Date/Time: 07/03/2021 7:25 AM ?Performed by: Wilburn Cornelia, CRNA ?Pre-anesthesia Checklist: Patient identified, Emergency Drugs available, Suction available, Patient being monitored and Timeout performed ?Patient Re-evaluated:Patient Re-evaluated prior to induction ?Oxygen Delivery Method: Nasal cannula ?Placement Confirmation: positive ETCO2 and breath sounds checked- equal and bilateral ?Dental Injury: Teeth and Oropharynx as per pre-operative assessment  ? ? ? ? ?

## 2021-07-03 NOTE — Op Note (Signed)
[facility] ?Patient Name: Roy Trujillo ?Procedure Date : 07/03/2021 ?MRN: [id number] ?Attending MD: Willaim Rayas. Adela Lank , MD ?Date of Birth: [birth year] ?CSN: [id number] ?Age: 56 ?Admit Type: Inpatient ?Procedure:                Upper GI endoscopy ?Indications:              Epigastric abdominal pain, Abnormal CT of the GI  ?                          tract with gastric / duodenal thickening, history  ?                          of NSAID use, diarrhea ?Providers:                Willaim Rayas. Adela Lank, MD, Norman Clay, RN, Nicanor Bake  ?                          Gretchen Short, Technician ?Referring MD:              ?Medicines:                Monitored Anesthesia Care ?Complications:            No immediate complications. Estimated blood loss:  ?                          Minimal. ?Estimated Blood Loss:     Estimated blood loss was minimal. ?Procedure:                Pre-Anesthesia Assessment: ?                          - Prior to the procedure, a History and Physical  ?                          was performed, and patient medications and  ?                          allergies were reviewed. The patient's tolerance of  ?                          previous anesthesia was also reviewed. The risks  ?                          and benefits of the procedure and the sedation  ?                          options and risks were discussed with the patient.  ?                          All questions were answered, and informed consent  ?                          was obtained. Prior Anticoagulants: The patient has  ?  taken no previous anticoagulant or antiplatelet  ?                          agents. ASA Grade Assessment: III - A patient with  ?                          severe systemic disease. After reviewing the risks  ?                          and benefits, the patient was deemed in  ?                          satisfactory condition to undergo the procedure. ?                          After obtaining  informed consent, the endoscope was  ?                          passed under direct vision. Throughout the  ?                          procedure, the patient's blood pressure, pulse, and  ?                          oxygen saturations were monitored continuously. The  ?                          GIF-H190 ([id number]) Olympus endoscope was introduced  ?                          through the mouth, and advanced to the second part  ?                          of duodenum. The upper GI endoscopy was  ?                          accomplished without difficulty. The patient  ?                          tolerated the procedure well. ?Scope In: ?Scope Out: ?Findings: ?     Esophagogastric landmarks were identified: the Z-line was found at 37  ?     cm, the gastroesophageal junction was found at 37 cm and the upper  ?     extent of the gastric folds was found at 40 cm from the incisors. ?     A 3 cm hiatal hernia was present. ?     The exam of the esophagus was otherwise normal. No varices. ?     One non-bleeding cratered gastric ulcer with a clean ulcer base (Forrest  ?     Class III) was found at the pyloric channel. The lesion was 5-6 mm in  ?     largest dimension. ?     Few non-bleeding superficial diminutive gastric ulcers with no stigmata  ?     of bleeding were found in the prepyloric region of  the stomach, along  ?     with inflammatory changes of the mucosa in that area. Biopsies were  ?     taken with a cold forceps for histology. ?     There was a protuberance in the fundus concerning for a prominent  ?     subepithelial vascular lesion (varix?) although no varices seen on MRI /  ?     CT abdomen. The exam of the stomach was otherwise normal. ?     Biopsies were taken with a cold forceps for Helicobacter pylori testing. ?     One non-bleeding cratered duodenal ulcer with a clean ulcer base  ?     (Forrest Class III) was found in the duodenal bulb extending into the  ?     duodenal sweep. The lesion was 6 mm in largest  dimension. ?     Diffuse mildly scalloped mucosa was found in the second portion of the  ?     duodenum. Biopsies were taken with a cold forceps for histology. ?     The exam of the duodenum was otherwise normal. ?Impression:               - Esophagogastric landmarks identified. ?                          - 3 cm hiatal hernia. ?                          - Normal esophagus otherwise. ?                          - Suspected benign appearing subepithelial vascular  ?                          lesion in the fundus ?                          - Non-bleeding gastric ulcer in the pylorus with a  ?                          clean ulcer base (Forrest Class III). ?                          - Non-bleeding small gastric ulcers with no  ?                          stigmata of bleeding in the antrum / pre-pylorus.  ?                          Biopsied. ?                          - Normal stomach otherwise. Biopsies taken to rule  ?                          out H pylori ?                          - Non-bleeding duodenal ulcer with a clean ulcer  ?  base (Forrest Class III). ?                          - Scalloped mucosa was found in the duodenum.  ?                          Biopsied. ?                          Suspect changes above all due to NSAIDs, but will  ?                          await pathology results ?                          Marland Kitchen ?Recommendation:           - Return patient to hospital ward for ongoing care. ?                          - Resume previous diet. Advance to soft diet as  ?                          tolerated (he has had no bleeding symptoms) ?                          - Continue present medications. ?                          - Await pathology results. ?                          - NO NSAIDS ?                          - Continue protonix 40mg  PO BID for 6 weeks, then  ?                          once daily thereafter ?                          - Repeat EGD as outpatient in 2-3 months to assess  ?                           for mucosal healing - we will coordinate ?                          - Liver enzymes trending down - some serologic  ?                          workup pending to assess for infectious etiologies,  ?                          stable mild biliary tree dilation in the setting of  ?  post cholecystectomy but does not seem obstructed,  ?                          HIV viral load undetectable. DILI also possible  ?                          from NSAIDs, vs. HIV regimen (less likely hopefully  ?                          as he has been on this regimen for some time). ?                          - If patient is feeling better and tolerating a  ?                          diet can be discharged home later today ?                          - We will coordinate follow up as outpatient. Will  ?                          need close monitoring of LFTs, repeat in a few  ?                          days. If ALT elevation persists over time will need  ?                          additional serologic workup and consideration for  ?                          liver biopsy if etiology remains unclear ?                          - Call with questions ?Procedure Code(s):        --- Professional --- ?                          [phone], Esophagogastroduodenoscopy, flexible,  ?                          transoral; with biopsy, single or multiple ?Diagnosis Code(s):        --- Professional --- ?                          K44.9, Diaphragmatic hernia without obstruction or  ?                          gangrene ?                          K25.9, Gastric ulcer, unspecified as acute or  ?                          chronic, without hemorrhage or perforation ?  K26.9, Duodenal ulcer, unspecified as acute or  ?                          chronic, without hemorrhage or perforation ?                          K31.89, Other diseases of stomach and duodenum ?                          R10.13, Epigastric  pain ?                          R93.3, Abnormal findings on diagnostic imaging of  ?                          other parts of digestive tract ?CPT copyright 2019 [employer]. All rights reserved.

## 2021-07-03 NOTE — Transfer of Care (Signed)
Immediate Anesthesia Transfer of Care Note ? ?Patient: Roy Trujillo ? ?Procedure(s) Performed: ESOPHAGOGASTRODUODENOSCOPY (EGD) WITH PROPOFOL ?BIOPSY ? ?Patient Location: Endoscopy Unit ? ?Anesthesia Type:MAC ? ?Level of Consciousness: drowsy ? ?Airway & Oxygen Therapy: Patient Spontanous Breathing and Patient connected to nasal cannula oxygen ? ?Post-op Assessment: Report given to RN and Post -op Vital signs reviewed and stable ? ?Post vital signs: Reviewed and stable ? ?Last Vitals:  ?Vitals Value Taken Time  ?BP 100/47 07/03/21 0750  ?Temp 36.5 ?C 07/03/21 0749  ?Pulse 47 07/03/21 0753  ?Resp 18 07/03/21 0753  ?SpO2 99 % 07/03/21 0753  ?Vitals shown include unvalidated device data. ? ?Last Pain:  ?Vitals:  ? 07/03/21 0749  ?TempSrc: Temporal  ?PainSc:   ?   ? ?Patients Stated Pain Goal: 0 (07/01/21 1958) ? ?Complications: No notable events documented. ?

## 2021-07-03 NOTE — Discharge Summary (Signed)
Physician Discharge Summary  ?TINO Trujillo QHU:[id number] DOB: [birth year] DOA: 06/30/2021 ? ?PCP: [facility] ? ?Admit date: 06/30/2021 ?Discharge date: 07/03/2021 ?Recommendations for Outpatient Follow-up:  ?Follow up with PCP in 1 weeks-call for appointment ?Please obtain BMP/CBC in one week ? ?Discharge Dispo: home ?Discharge Condition: Stable ?Code Status:   Code Status: Full Code ?Diet recommendation:  ?Diet Order   ? ?       ?  DIET SOFT Room service appropriate? Yes; Fluid consistency: Thin  Diet effective now       ?  ? ?  ?  ? ?  ?  ? ?Brief/Interim Summary: ?Mr. Roy Trujillo is a 56 yo male with PMH depression/anxiety, HIV, GERD who presented with abdominal pain.  ?He was found to have elevated LFTs concerning for possible obstruction although does have a history of cholecystectomy approximately 5 years ago.  He underwent CT abdomen/pelvis which showed prepyloric gastric wall thickening and stable biliary dilatation.  He then underwent MRCP which showed mild intra and extrahepatic biliary ductal dilatation, no choledocholithiasis and no obstructing mass.  CBD dilated at 11 mm similar to prior dilation seen in 2019.  Also subtle edema noted along the duodenal bulb and descending duodenum tracking into hepatoduodenal ligament. ?With further collateral information he reports that he has been on twice daily naproxen for the past several months due to pains with his hands.  He also endorses occasional EtOH use but moreso rare (had a 40oz about 1 month ago, but endorses a history of "alcoholism").  Smokes marijuana intermittently. ?He was admitted for further work-up and evaluation by GI. ?His diarrhea resolved, no a stool evaluate for further work-up.  His LFTs were downtrending and plans for outpatient follow-up with GI until resolution.  Due to his long-term use of NSAIDs and anemia underwent EGD 5/2 and found to have gastric and duodenal ulcers plan is to continue PPI x6 weeks and  follow-up with GI as outpatient.  GI has cleared the patient for discharge home after he tolerates soft diet today.  Rest of his medical issues including neuropathy HIV infection are chronic and he will follow-up with his primary physician/ID.  Patient feels comfortable going home  ? ?Discharge Diagnoses:  ?Active Problems: ?  Elevated LFTs ?  NSAID long-term use ?  HIV infection ([place]) ?  Neuropathy ?  Abdominal pain, epigastric ?  Abnormal CT scan, gastrointestinal tract ?  Acute gastric ulcer without hemorrhage or perforation ?  Gastritis and gastroduodenitis ?  NSAID-induced gastric ulcer ?  NSAID-induced duodenal ulcer ? ? ?Diarrhea-resolved as of 07/02/2021 ?His diarrhea resolved, no a stool evaluate for further work-up. ? ?Elevated LFTs:Elevated AST/ALT on admission and now with worsening elevation along with increased alk phos and total bili. Has history of CCY and residual dilation expected plus MRCP negative for obstruction. Remaining differential for etiology would be a passed obstruction (stone vs sludge) vs possible med side effect.  His LFTs were downtrending and plans for outpatient follow-up with GI until resolution.  ? ?NSAID long-term use ?Gastritis and gastroduodenitis ?NSAID-induced gastric ulcer ?NSAID-induced duodenal ulcer: ?S/p EGD- found to have gastric / duodenal ulcers, likely due to NSAIDs. Plan is for Protonix $RemoveBefo'40mg'yKVpXVyvQVn$  BID For 6 weeks then once daily thereafter.if he tolerates soft diet he can go home ? ?Neuropathy Continue gabapentin ? ?HIV infection: ?- on Descovy and Tivicay with good control per patient ?- follows with HIV clinic in [place] ?- only prior labs are from 2018 and likely not accurate ?-  hx of mild LFT elevation and the current elevation is not likely to be due to his HIV meds which should be uninterrupted unless necessary which I do not think is necessary at this time ?- continue both at this time ? ? ?Consults: ?GI ?Subjective: ?Resting comfortably this morning seen in the  endoscopy after EGD. ? ?Discharge Exam: ?Vitals:  ? 07/03/21 0805 07/03/21 0810  ?BP: (!) 100/53 (!) 118/55  ?Pulse: (!) 57 (!) 55  ?Resp: 14 10  ?Temp:    ?SpO2: 99% 100%  ? ?General: Pt is alert, awake, not in acute distress ?Cardiovascular: RRR, S1/S2 +, no rubs, no gallops ?Respiratory: CTA bilaterally, no wheezing, no rhonchi ?Abdominal: Soft, NT, ND, bowel sounds + ?Extremities: no edema, no cyanosis ? ?Discharge Instructions ? ?Discharge Instructions   ? ? Discharge instructions   Complete by: As directed ?  ? Continue Protonix for 6 weeks twice a day after which take once a day, follow-up with GI to monitor LFTs blood test for further follow-up. ? ?Please call call MD or return to ER for similar or worsening recurring problem that brought you to hospital or if any fever,nausea/vomiting,abdominal pain, uncontrolled pain, chest pain,  shortness of breath or any other alarming symptoms. ? ?Please follow-up your doctor as instructed in a week time and call the office for appointment. ? ?Please avoid alcohol, smoking, or any other illicit substance and maintain healthy habits including taking your regular medications as prescribed. ? ?You were cared for by a hospitalist during your hospital stay. If you have any questions about your discharge medications or the care you received while you were in the hospital after you are discharged, you can call the unit and ask to speak with the hospitalist on call if the hospitalist that took care of you is not available. ? ?Once you are discharged, your primary care physician will handle any further medical issues. Please note that NO REFILLS for any discharge medications will be authorized once you are discharged, as it is imperative that you return to your primary care physician (or establish a relationship with a primary care physician if you do not have one) for your aftercare needs so that they can reassess your need for medications and monitor your lab values  ?  Increase activity slowly   Complete by: As directed ?  ? ?  ? ?Allergies as of 07/03/2021   ? ?   Reactions  ? Penicillins Other (See Comments)  ? Unknown childhood reaction ?Has patient had a PCN reaction causing immediate rash, facial/tongue/throat swelling, SOB or lightheadedness with hypotension: Unknown ?Has patient had a PCN reaction causing severe rash involving mucus membranes or skin necrosis: Unknown ?Has patient had a PCN reaction that required hospitalization: Unknown ?Has patient had a PCN reaction occurring within the last 10 years: Unknown ?If all of the above answers are "NO", then may proceed with Cephalosporin use.  ? Lexapro [escitalopram] Other (See Comments)  ? seizure  ? Bactrim [sulfamethoxazole-trimethoprim] Rash  ? Zerit [stavudine] Rash  ? ?  ? ?  ?Medication List  ?  ? ?STOP taking these medications   ? ?naproxen 500 MG tablet ?Commonly known as: NAPROSYN ?  ? ?  ? ?TAKE these medications   ? ?acetaminophen 500 MG tablet ?Commonly known as: TYLENOL ?Take 1,000 mg by mouth every 6 (six) hours as needed for moderate pain or headache. ?  ?bismuth subsalicylate 353 IR/44RX suspension ?Commonly known as: PEPTO BISMOL ?Take 30 mLs by mouth every  6 (six) hours as needed for diarrhea or loose stools or indigestion. ?  ?Descovy 200-25 MG tablet ?Generic drug: emtricitabine-tenofovir AF ?Take 1 tablet by mouth at bedtime. ?  ?diclofenac Sodium 1 % Gel ?Commonly known as: VOLTAREN ?Apply 2 g topically 2 (two) times daily as needed (hands). ?  ?diphenhydrAMINE 25 MG tablet ?Commonly known as: [place] ?Take 25 mg by mouth at bedtime as needed for allergies. ?  ?dolutegravir 50 MG tablet ?Commonly known as: TIVICAY ?Take 50 mg by mouth at bedtime. ?  ?gabapentin 300 MG capsule ?Commonly known as: NEURONTIN ?Take 300 mg by mouth See admin instructions. 300 mg - morning and afternoon ?600 mg - bedtime ?  ?ketoconazole 2 % shampoo ?Commonly known as: NIZORAL ?Apply 1 application. topically daily. ?   ?mirtazapine 30 MG tablet ?Commonly known as: REMERON ?Take 30 mg by mouth daily. ?  ?oxymetazoline 0.05 % nasal spray ?Commonly known as: AFRIN ?Place 1 spray into both nostrils daily as needed for congestion. ?

## 2021-07-03 NOTE — Telephone Encounter (Signed)
-----   Message from Annett Fabian, RN sent at 07/03/2021 11:32 AM EDT ----- ?Regarding: FW: [facility] outpatient follow up ? ?----- Message ----- ?From: Benancio Deeds, MD ?Sent: 07/03/2021   8:12 AM EDT ?To: Annett Fabian, RN ?Subject: Russella Dar outpatient follow up                    ? ?Sheri I am in the hospital and can't remember who is covering Russella Dar now if you can forward to them: ? ?Patient needs LFTs on Thursday / Friday at the office coordinated ?Needs outpatient follow up with Dr. Russella Dar or APP in the next 2-3 weeks, to be discharged today. Thanks ? ? ? ?

## 2021-07-03 NOTE — Interval H&P Note (Signed)
History and Physical Interval Note: Patient feeling improved today. NO interval changes otherwise. No further vomiting. Pain improved. EGD to further evaluate upper tract symptoms and CT abnormalities, rule out PUD. I have discussed risks / benefits with him and he wishes to proceed. ? ?07/03/2021 ?7:19 AM ? ?Roy Trujillo  has presented today for surgery, with the diagnosis of Epigastric pain, nausea, vomiting, diarrhea.  Imaging suspicious for ulcer disease..  The various methods of treatment have been discussed with the patient and family. After consideration of risks, benefits and other options for treatment, the patient has consented to  Procedure(s): ?ESOPHAGOGASTRODUODENOSCOPY (EGD) WITH PROPOFOL (N/A) as a surgical intervention.  The patient's history has been reviewed, patient examined, no change in status, stable for surgery.  I have reviewed the patient's chart and labs.  Questions were answered to the patient's satisfaction.   ? ? ?Roy Trujillo ? ? ?

## 2021-07-03 NOTE — Progress Notes (Signed)
Pt cleared to be d/c. D/C orders in place.  ?

## 2021-07-03 NOTE — Anesthesia Postprocedure Evaluation (Signed)
Anesthesia Post Note ? ?Patient: Roy Trujillo ? ?Procedure(s) Performed: ESOPHAGOGASTRODUODENOSCOPY (EGD) WITH PROPOFOL ?BIOPSY ? ?  ? ?Patient location during evaluation: PACU ?Anesthesia Type: MAC ?Level of consciousness: awake and alert ?Pain management: pain level controlled ?Vital Signs Assessment: post-procedure vital signs reviewed and stable ?Respiratory status: spontaneous breathing, nonlabored ventilation and respiratory function stable ?Cardiovascular status: blood pressure returned to baseline ?Postop Assessment: no apparent nausea or vomiting ?Anesthetic complications: no ? ? ?No notable events documented. ? ?Last Vitals:  ?Vitals:  ? 07/03/21 0805 07/03/21 0810  ?BP: (!) 100/53 (!) 118/55  ?Pulse: (!) 57 (!) 55  ?Resp: 14 10  ?Temp:    ?SpO2: 99% 100%  ?  ?Last Pain:  ?Vitals:  ? 07/03/21 0810  ?TempSrc:   ?PainSc: 0-No pain  ? ? ?  ?  ?  ?  ?  ?  ? ?Marthenia Rolling ? ? ? ? ?

## 2021-07-03 NOTE — Telephone Encounter (Signed)
Spoke with patient regarding lab work on Thursday or Friday this week. He is aware to come in between 7:30am-5:00 pm to the basement to have these labs collected. Follow up appointment scheduled with Marchelle Folks, PA on 07/17/21 at 11:30 pm.  ?

## 2021-07-03 NOTE — Progress Notes (Signed)
Discharge instructions given. Patient verbalized understanding and all questions were answered.  ?

## 2021-07-04 ENCOUNTER — Encounter (HOSPITAL_COMMUNITY): Payer: Self-pay | Admitting: Gastroenterology

## 2021-07-04 LAB — SURGICAL PATHOLOGY

## 2021-07-06 LAB — MISC LABCORP TEST (SEND OUT): Labcorp test code: 820186

## 2021-07-10 ENCOUNTER — Telehealth: Payer: Self-pay | Admitting: Gastroenterology

## 2021-07-10 NOTE — Telephone Encounter (Signed)
Patient is returning your call regarding lab results. °

## 2021-07-10 NOTE — Telephone Encounter (Signed)
Returned patient's call see result note.  

## 2021-07-16 NOTE — Progress Notes (Incomplete)
? ? ? ?07/16/2021 ?ASHVIN ADELSON ?[id number] ?10-Nov-1965 ? ?Referring provider: [facility], [facility]* ?Primary GI doctor: Dr. Fuller Plan ? ?ASSESSMENT AND PLAN:  ? ?There are no diagnoses linked to this encounter. ? ? ?Patient Care Team: ?[facility], [facility] as PCP - General ? ?HISTORY OF PRESENT ILLNESS: ?56 y.o. male with a past medical history of HIV, depression , etoh abuse, avascular necrosis, MAI ,bilateral inguinal hernia repair, cholecystectomy.and others listed below, presents for hospital follow up.  ?Patient was in the hospital from 06/30/21 to 07/03/21, was in the hospital for abdominal pain, nausea diarrhea elevated LFTs.. ? ?AST up to 543, ALT 437. Alk phos 310 and T bili 1.5.  ?06/30/2021 CT abdomen pelvis with contrast suggestion of pre pyloric gastric wall thickening, can be seen with gastritis or peptic ulcer disease. Stable biliary dilatation, typically related to prior cholecystectomy.   ?07/01/2021 MR abdomen.Mild intrahepatic and extrahepatic biliary duct dilatation.  ?No choledocholithiasis. No obstructing mass lesion evident. Common bile ?duct in the head of the pancreas is 11 mm diameter today in was 10 ?mm diameter on CT scan of 02/12/2018 suggesting no substantial ?interval change. Features may reflect prior cholecystectomy. Subtle edema in the region of the duodenal bulb and descending duodenum, tracking into the hepatoduodenal ligament. Pancreas does not appear to be the epicenter of edema and duodenitis/peptic ulcer ?disease would be a consideration ?07/03/2021 upper endoscopy ? ?Current Medications:  ? ? ? ?Current Outpatient Medications (Respiratory):  ?  oxymetazoline (AFRIN) 0.05 % nasal spray, Place 1 spray into both nostrils daily as needed for congestion. ?  promethazine (PHENERGAN) 25 MG tablet, Take 1 tablet (25 mg total) by mouth every 6 (six) hours as needed for nausea or vomiting. ? ?Current Outpatient Medications (Analgesics):  ?  acetaminophen (TYLENOL) 500  MG tablet, Take 1,000 mg by mouth every 6 (six) hours as needed for moderate pain or headache. ? ? ?Current Outpatient Medications (Other):  ?  bismuth subsalicylate (PEPTO BISMOL) 262 MG/15ML suspension, Take 30 mLs by mouth every 6 (six) hours as needed for diarrhea or loose stools or indigestion. ?  DESCOVY 200-25 MG tablet, Take 1 tablet by mouth at bedtime.  ?  diclofenac Sodium (VOLTAREN) 1 % GEL, Apply 2 g topically 2 (two) times daily as needed (hands). ?  diphenhydrAMINE (SOMINEX) 25 MG tablet, Take 25 mg by mouth at bedtime as needed for allergies. ?  dolutegravir (TIVICAY) 50 MG tablet, Take 50 mg by mouth at bedtime. ?  gabapentin (NEURONTIN) 300 MG capsule, Take 300 mg by mouth See admin instructions. 300 mg - morning and afternoon 600 mg - bedtime ?  ketoconazole (NIZORAL) 2 % shampoo, Apply 1 application. topically daily. ?  mirtazapine (REMERON) 30 MG tablet, Take 30 mg by mouth daily. ?  pantoprazole (PROTONIX) 40 MG tablet, Take 1 tablet (40 mg total) by mouth 2 (two) times daily. After 6 wk take once daily. ?  traZODone (DESYREL) 100 MG tablet, Take 200 mg by mouth at bedtime. ?  triamcinolone (KENALOG) 0.025 % ointment, Apply 1 application topically daily as needed (dermatitis). ? ?Medical History:  ?Past Medical History:  ?Diagnosis Date  ? Anxiety   ? Arthritis of hand   ? RA  ? Avascular necrosis ([place])   ? Collagen vascular disease ([place])   ? RA  ? Depression   ? GERD (gastroesophageal reflux disease)   ? HIV (human immunodeficiency virus infection) ([place])   ? ?Allergies:  ?Allergies  ?Allergen Reactions  ? Penicillins Other (See  Comments)  ?  Unknown childhood reaction ?Has patient had a PCN reaction causing immediate rash, facial/tongue/throat swelling, SOB or lightheadedness with hypotension: Unknown ?Has patient had a PCN reaction causing severe rash involving mucus membranes or skin necrosis: Unknown ?Has patient had a PCN reaction that required hospitalization: Unknown ?Has patient had a  PCN reaction occurring within the last 10 years: Unknown ?If all of the above answers are "NO", then may proceed with Cephalosporin use. ?  ? Lexapro [Escitalopram] Other (See Comments)  ?  seizure  ? Bactrim [Sulfamethoxazole-Trimethoprim] Rash  ? Zerit [Stavudine] Rash  ?  ? ?Surgical History:  ?He  has a past surgical history that includes Hernia repair (1974); Joint replacement (Left); Cholecystectomy (2013); Tonsillectomy; Total hip arthroplasty (Right, 05/15/2016); Robot assisted inguinal hernia repair (Right, 01/15/2018); Esophagogastroduodenoscopy (egd) with propofol (N/A, 07/03/2021); and biopsy (07/03/2021). ?Family History:  ?His family history is not on file. ?Social History:  ? reports that he has never smoked. He has never used smokeless tobacco. He reports current drug use. Drug: Marijuana. He reports that he does not drink alcohol. ? ?REVIEW OF SYSTEMS  : All other systems reviewed and negative except where noted in the History of Present Illness. ? ? ?PHYSICAL EXAM: ?There were no vitals taken for this visit. ?General:   Pleasant, well developed male in no acute distress ?Head:   Normocephalic and atraumatic. ?Eyes:  {sclerae:26738},conjunctive {conjuctiva:26739}  ?Heart:   {HEART EXAM HEM/ONC:21750} ?Pulm:  Clear anteriorly; no wheezing ?Abdomen:   {BlankSingle:19197::"Distended","Ridged","Soft"}, {BlankSingle:19197::"Flat","Obese","Non-distended"} AB, {BlankSingle:19197::"Absent","Hyperactive, tinkling","Hypoactive","Sluggish","Active"} bowel sounds. {actendernessAB:27319} tenderness {anatomy; site abdomen:5010}. {BlankMultiple:19196::"Without guarding","With guarding","Without rebound","With rebound"}, No organomegaly appreciated. ?Rectal: {acrectalexam:27461} ?Extremities:  {With/Without:304960234} edema. ?Msk: Symmetrical without gross deformities. Peripheral pulses intact.  ?Neurologic:  Alert and  oriented x4;  No focal deficits.  ?Skin:   Dry and intact without significant lesions or  rashes. ?Psychiatric:  Cooperative. Normal mood and affect. ? ? ? ?Vladimir Crofts, PA-C ?12:22 PM ? ? ?

## 2021-07-17 ENCOUNTER — Ambulatory Visit: Payer: Medicare Other | Admitting: Physician Assistant

## 2021-11-09 ENCOUNTER — Encounter (HOSPITAL_COMMUNITY): Payer: Self-pay | Admitting: Emergency Medicine

## 2021-11-09 ENCOUNTER — Emergency Department (HOSPITAL_COMMUNITY)
Admission: EM | Admit: 2021-11-09 | Discharge: 2021-11-10 | Disposition: A | Discharge status: Home / Self Care | Payer: Medicare Other | Attending: Emergency Medicine | Admitting: Emergency Medicine

## 2021-11-09 ENCOUNTER — Emergency Department (HOSPITAL_COMMUNITY): Payer: Medicare Other

## 2021-11-09 DIAGNOSIS — Z23 Encounter for immunization: Secondary | ICD-10-CM | POA: Insufficient documentation

## 2021-11-09 DIAGNOSIS — S61412A Laceration without foreign body of left hand, initial encounter: Secondary | ICD-10-CM | POA: Diagnosis present

## 2021-11-09 DIAGNOSIS — W260XXA Contact with knife, initial encounter: Secondary | ICD-10-CM | POA: Insufficient documentation

## 2021-11-09 MED ORDER — OXYCODONE-ACETAMINOPHEN 5-325 MG PO TABS
2.0000 | ORAL_TABLET | Freq: Once | ORAL | Status: AC
  Administered 2021-11-10: 2 via ORAL
  Filled 2021-11-09: qty 2

## 2021-11-09 MED ORDER — LIDOCAINE-EPINEPHRINE-TETRACAINE (LET) TOPICAL GEL
3.0000 mL | Freq: Once | TOPICAL | Status: AC
  Administered 2021-11-10: 3 mL via TOPICAL
  Filled 2021-11-09: qty 3

## 2021-11-09 MED ORDER — TETANUS-DIPHTH-ACELL PERTUSSIS 5-2.5-18.5 LF-MCG/0.5 IM SUSY
0.5000 mL | PREFILLED_SYRINGE | Freq: Once | INTRAMUSCULAR | Status: AC
  Administered 2021-11-10: 0.5 mL via INTRAMUSCULAR
  Filled 2021-11-09: qty 0.5

## 2021-11-09 NOTE — ED Provider Triage Note (Signed)
Emergency Medicine Provider Triage Evaluation Note  Roy Trujillo , a 56 y.o. male  was evaluated in triage.  Pt complains of laceration left hand.  Patient states that he was separating frozen hamburger with a night when he stabbed himself in his left palm.  Bleeding is controlled with direct pressure.  Denies weakness or sensory deficits in affected hand..  Review of Systems  Positive: See above Negative:   Physical Exam  BP 133/83 (BP Location: Right Arm)   Pulse 69   Temp 98.7 F (37.1 C) (Oral)   Resp 14   SpO2 97%  Gen:   Awake, no distress   Resp:  Normal effort  MSK:   Moves extremities without difficulty  Other:  Lumbar 5 to 2 cm linear laceration noted on the medial aspect of patient's left palm.  No obvious foreign body.  Bleeding controlled with direct pressure.  No obvious bony tenderness of fifth metacarpal  Medical Decision Making  Medically screening exam initiated at 7:08 PM.  Appropriate orders placed.  Roy Trujillo was informed that the remainder of the evaluation will be completed by another provider, this initial triage assessment does not replace that evaluation, and the importance of remaining in the ED until their evaluation is complete.     Peter Garter, [place] 11/09/21 1910

## 2021-11-09 NOTE — ED Triage Notes (Signed)
Patient here with complaint of an injury to his left palm that occurred at approximately 1700 today when he was separating frozen burgers with a knife and stabbed himself in the hand. Bleeding controlled, unknown last tetanus vaccine.

## 2021-11-10 NOTE — ED Provider Notes (Signed)
[facility] EMERGENCY DEPARTMENT Provider Note   CSN: [id number] Arrival date & time: 11/09/21  1752     History  Chief Complaint  Patient presents with   Hand Injury    Roy Trujillo is a 56 y.o. male.  56 year old male with no significant past medical history who presents the ER today with a laceration to his left hand.  States that he was trying to separate some hamburger with a sharp knife and it slipped and cut his hand just prior to arrival.  Unknown tetanus shot.  No injuries elsewhere.   Hand Injury      Home Medications Prior to Admission medications   Medication Sig Start Date End Date Taking? Authorizing Provider  acetaminophen (TYLENOL) 500 MG tablet Take 1,000 mg by mouth every 6 (six) hours as needed for moderate pain or headache.    [provider]  bismuth subsalicylate (PEPTO BISMOL) 262 MG/15ML suspension Take 30 mLs by mouth every 6 (six) hours as needed for diarrhea or loose stools or indigestion.    [provider]  DESCOVY 200-25 MG tablet Take 1 tablet by mouth at bedtime.  10/01/16   [provider]  diclofenac Sodium (VOLTAREN) 1 % GEL Apply 2 g topically 2 (two) times daily as needed (hands). 06/04/21   [provider]  diphenhydrAMINE (SOMINEX) 25 MG tablet Take 25 mg by mouth at bedtime as needed for allergies.    [provider]  dolutegravir (TIVICAY) 50 MG tablet Take 50 mg by mouth at bedtime. 03/27/15   [provider]  gabapentin (NEURONTIN) 300 MG capsule Take 300 mg by mouth See admin instructions. 300 mg - morning and afternoon 600 mg - bedtime 12/31/17   [provider]  ketoconazole (NIZORAL) 2 % shampoo Apply 1 application. topically daily. 06/04/21   [provider]  mirtazapine (REMERON) 30 MG tablet Take 30 mg by mouth daily. 06/04/21   [provider]  oxymetazoline (AFRIN) 0.05 % nasal spray Place 1 spray into both nostrils daily as needed  for congestion.    [provider]  pantoprazole (PROTONIX) 40 MG tablet Take 1 tablet (40 mg total) by mouth 2 (two) times daily. After 6 wk take once daily. 07/03/21 08/14/21  Lanae Boast, MD  promethazine (PHENERGAN) 25 MG tablet Take 1 tablet (25 mg total) by mouth every 6 (six) hours as needed for nausea or vomiting. 07/13/20   Elpidio Anis, PA-C  traZODone (DESYREL) 100 MG tablet Take 200 mg by mouth at bedtime.    [provider]  triamcinolone (KENALOG) 0.025 % ointment Apply 1 application topically daily as needed (dermatitis).    [provider]      Allergies    Penicillins, Lexapro [escitalopram], Bactrim [sulfamethoxazole-trimethoprim], and Zerit [stavudine]    Review of Systems   Review of Systems  Physical Exam Updated Vital Signs BP 133/83 (BP Location: Right Arm)   Pulse 69   Temp 98.7 F (37.1 C) (Oral)   Resp 14   SpO2 97%  Physical Exam Vitals and nursing note reviewed.  Constitutional:      Appearance: He is well-developed.  HENT:     Head: Normocephalic and atraumatic.  Cardiovascular:     Rate and Rhythm: Normal rate.  Pulmonary:     Effort: Pulmonary effort is normal. No respiratory distress.  Abdominal:     General: There is no distension.  Musculoskeletal:        General: Normal range of motion.  Cervical back: Normal range of motion.  Skin:    Comments: Approximately 1 cm laceration to the volar surface of his left hand over the fifth metacarpal.  No exposed tendon or bone.  Hemostatic.  Neurological:     Mental Status: He is alert.     ED Results / Procedures / Treatments   Labs (all labs ordered are listed, but only abnormal results are displayed) Labs Reviewed - No data to display  EKG None  Radiology DG Hand Complete Left  Result Date: 11/09/2021 CLINICAL DATA:  Stab to the proximal medial palm EXAM: LEFT HAND - COMPLETE 3+ VIEW COMPARISON:  None Available. FINDINGS: No acute fracture or dislocation.  Question soft tissue laceration about the base of the thumb seen on lateral view. 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 IMPRESSION: No acute fracture or dislocation. Electronically Signed   By: Minerva Fester M.D.   On: 11/09/2021 19:29    Procedures .Marland KitchenLaceration Repair  Date/Time: 11/10/2021 1:14 AM  Performed by: Marily Memos, MD Authorized by: Marily Memos, MD   Consent:    Consent obtained:  Verbal   Consent given by:  Patient   Risks discussed:  Infection, need for additional repair, nerve damage, poor wound healing, poor cosmetic result, pain, retained foreign body, tendon damage and vascular damage   Alternatives discussed:  No treatment, delayed treatment and observation Universal protocol:    Procedure explained and questions answered to patient or proxy's satisfaction: yes     Relevant documents present and verified: yes     Site/side marked: yes     Patient identity confirmed:  Hospital-assigned identification number and arm band Anesthesia:    Anesthesia method:  Local infiltration   Local anesthetic:  Lidocaine 1% WITH epi Laceration details:    Location:  Hand   Hand location:  L palm   Length (cm):  1   Depth (mm):  4 Pre-procedure details:    Preparation:  Patient was prepped and draped in usual sterile fashion and imaging obtained to evaluate for foreign bodies Exploration:    Wound exploration: wound explored through full range of motion   Treatment:    Area cleansed with:  Saline   Amount of cleaning:  Extensive   Irrigation solution:  Sterile water   Irrigation method:  Syringe   Visualized foreign bodies/material removed: no     Debridement:  None   Undermining:  None Skin repair:    Repair method:  Sutures   Suture size:  3-0   Suture material:  Prolene   Suture technique:  Simple interrupted   Number of sutures:  3 Approximation:    Approximation:  Close Repair type:     Repair type:  Simple Post-procedure details:    Dressing:  Antibiotic ointment, sterile dressing and tube gauze   Procedure completion:  Tolerated well, no immediate complications     Medications Ordered in ED Medications  Tdap (BOOSTRIX) injection 0.5 mL (0.5 mLs Intramuscular Given 11/10/21 0009)  lidocaine-EPINEPHrine-tetracaine (LET) topical gel (3 mLs Topical Given 11/10/21 0009)  oxyCODONE-acetaminophen (PERCOCET/ROXICET) 5-325 MG per tablet 2 tablet (2 tablets Oral Given 11/10/21 0009)    ED Course/ Medical Decision Making/ A&P                           Medical Decision Making Risk Prescription drug management.  Small laceration.  Will repair.  Tetanus updated. Xr reassuring.  No  obvious foreign body after cleaning. No tendon/muscle involvement.   Will see PCP/UC in 10 days for suture removal  Final Clinical Impression(s) / ED Diagnoses Final diagnoses:  Laceration of left hand without foreign body, initial encounter    Rx / DC Orders ED Discharge Orders     None         Shanele Nissan, Barbara Cower, MD 11/10/21 9622

## 2022-11-10 ENCOUNTER — Emergency Department (HOSPITAL_COMMUNITY): Payer: Medicare Other

## 2022-11-10 ENCOUNTER — Emergency Department (HOSPITAL_COMMUNITY)
Admission: EM | Admit: 2022-11-10 | Discharge: 2022-11-10 | Disposition: A | Discharge status: Home / Self Care | Payer: Medicare Other | Attending: Emergency Medicine | Admitting: Emergency Medicine

## 2022-11-10 ENCOUNTER — Encounter (HOSPITAL_COMMUNITY): Payer: Self-pay

## 2022-11-10 ENCOUNTER — Other Ambulatory Visit: Payer: Self-pay

## 2022-11-10 DIAGNOSIS — Z21 Asymptomatic human immunodeficiency virus [HIV] infection status: Secondary | ICD-10-CM | POA: Insufficient documentation

## 2022-11-10 DIAGNOSIS — R519 Headache, unspecified: Secondary | ICD-10-CM | POA: Diagnosis not present

## 2022-11-10 DIAGNOSIS — Z1152 Encounter for screening for COVID-19: Secondary | ICD-10-CM | POA: Diagnosis not present

## 2022-11-10 DIAGNOSIS — R112 Nausea with vomiting, unspecified: Secondary | ICD-10-CM | POA: Diagnosis present

## 2022-11-10 LAB — COMPREHENSIVE METABOLIC PANEL
ALT: 20 U/L (ref 0–44)
AST: 19 U/L (ref 15–41)
Albumin: 4.6 g/dL (ref 3.5–5.0)
Alkaline Phosphatase: 62 U/L (ref 38–126)
Anion gap: 13 (ref 5–15)
BUN: 6 mg/dL (ref 6–20)
CO2: 25 mmol/L (ref 22–32)
Calcium: 9.4 mg/dL (ref 8.9–10.3)
Chloride: 100 mmol/L (ref 98–111)
Creatinine, Ser: 0.88 mg/dL (ref 0.61–1.24)
GFR, Estimated: >60 mL/min (ref >60)
Glucose, Bld: 115 mg/dL — ABNORMAL HIGH (ref 70–99)
Potassium: 3.3 mmol/L — ABNORMAL LOW (ref 3.5–5.1)
Sodium: 138 mmol/L (ref 135–145)
Total Bilirubin: 1.2 mg/dL (ref 0.3–1.2)
Total Protein: 7 g/dL (ref 6.5–8.1)

## 2022-11-10 LAB — CBC
HCT: 43.1 % (ref 39.0–52.0)
Hemoglobin: 14.9 g/dL (ref 13.0–17.0)
MCH: 33.9 pg (ref 26.0–34.0)
MCHC: 34.6 g/dL (ref 30.0–36.0)
MCV: 98 fL (ref 80.0–100.0)
Platelets: 116 10*3/uL — ABNORMAL LOW (ref 150–400)
RBC: 4.4 MIL/uL (ref 4.22–5.81)
RDW: 12.4 % (ref 11.5–15.5)
WBC: 6.6 10*3/uL (ref 4.0–10.5)
nRBC: 0 % (ref 0.0–0.2)

## 2022-11-10 LAB — RESP PANEL BY RT-PCR (RSV, FLU A&B, COVID)  RVPGX2
Influenza A by PCR: NEGATIVE
Influenza B by PCR: NEGATIVE
Resp Syncytial Virus by PCR: NEGATIVE
SARS Coronavirus 2 by RT PCR: NEGATIVE

## 2022-11-10 LAB — LIPASE, BLOOD: Lipase: 22 U/L (ref 11–51)

## 2022-11-10 MED ORDER — METOCLOPRAMIDE HCL 5 MG/ML IJ SOLN
10.0000 mg | Freq: Once | INTRAMUSCULAR | Status: AC
  Administered 2022-11-10: 10 mg via INTRAVENOUS
  Filled 2022-11-10: qty 2

## 2022-11-10 MED ORDER — LACTATED RINGERS IV BOLUS
1000.0000 mL | Freq: Once | INTRAVENOUS | Status: AC
  Administered 2022-11-10: 1000 mL via INTRAVENOUS

## 2022-11-10 MED ORDER — IOHEXOL 350 MG/ML SOLN
75.0000 mL | Freq: Once | INTRAVENOUS | Status: AC | PRN
  Administered 2022-11-10: 75 mL via INTRAVENOUS

## 2022-11-10 MED ORDER — PROCHLORPERAZINE EDISYLATE 10 MG/2ML IJ SOLN
5.0000 mg | Freq: Once | INTRAMUSCULAR | Status: AC
  Administered 2022-11-10: 5 mg via INTRAVENOUS
  Filled 2022-11-10: qty 2

## 2022-11-10 MED ORDER — ACETAMINOPHEN 500 MG PO TABS
1000.0000 mg | ORAL_TABLET | Freq: Once | ORAL | Status: AC
  Administered 2022-11-10: 1000 mg via ORAL
  Filled 2022-11-10: qty 2

## 2022-11-10 MED ORDER — ONDANSETRON 4 MG PO TBDP: 4.0000 mg | ORAL_TABLET | Freq: Three times a day (TID) | ORAL | 0 refills | Status: AC | PRN

## 2022-11-10 MED ORDER — ONDANSETRON 4 MG PO TBDP
4.0000 mg | ORAL_TABLET | Freq: Once | ORAL | Status: AC | PRN
  Administered 2022-11-10: 4 mg via ORAL
  Filled 2022-11-10: qty 1

## 2022-11-10 NOTE — ED Notes (Signed)
Pt reporting worsening nausea and headache. Requesting additional medication to alleviate symptoms. Will inform provider once assigned.

## 2022-11-10 NOTE — ED Notes (Signed)
This RN reviewed discharge instructions with patient and family member. Both verbalized understanding and denied any further questions. PT well appearing upon discharge and reports tolerable pain. Pt ambulated with stable gait to exit. Pt endorses ride home.

## 2022-11-10 NOTE — Discharge Instructions (Signed)
Your workup today was reassuring.  Your potassium is slightly low, recommend you have this rechecked by your primary doctor.  He should eat and drink plenty of fluids.  If you develop severe headache or any other new concerning symptoms you should return to the ED.  You should follow-up with your doctor later this week.

## 2022-11-10 NOTE — ED Notes (Signed)
PT reports improved stomach pain, but headache remain painful.

## 2022-11-10 NOTE — ED Triage Notes (Signed)
Pt has multiple complaints: Left earache/ringing in his ear, nausea, chills, sweats since Last Wednesday. Pt recently started Buspar 2 months ago in addition to his trazodone and mirtazapine.

## 2022-11-10 NOTE — ED Notes (Signed)
Pt in CT, well appearing upon transport.

## 2022-11-10 NOTE — ED Notes (Signed)
EM provider at bedside.

## 2022-11-10 NOTE — ED Provider Notes (Signed)
EMERGENCY DEPARTMENT AT [facility] Provider Note   CSN: [id number] Arrival date & time: 11/10/22  1039     History  Chief Complaint  Patient presents with   Nausea   Tinnitus    Roy Trujillo is a 57 y.o. male.  HPI 57 year old male history of GERD, HIV on antiretroviral therapy presenting for multiple concerns.  States since Thursday he has had mild generalized headache, ringing in his left ear, nausea and vomiting.  Is also developed some generalized abdominal pain.  He has had chronic diarrhea which is unchanged, no melena or hematochezia.  No blood in his vomit.  No chest pain or shortness of breath.  No weakness or numbness.  He states he takes trazodone and mirtazapine and about 6 weeks ago started BuSpar.  He is concerned this could be interacting.  No more recent medication changes.  No sick exposures.  No cough.  He has otherwise been at his baseline health.  He is eating and drinking some.     Home Medications Prior to Admission medications   Medication Sig Start Date End Date Taking? Authorizing Provider  ondansetron (ZOFRAN-ODT) 4 MG disintegrating tablet Take 1 tablet (4 mg total) by mouth every 8 (eight) hours as needed for nausea or vomiting. 11/10/22  Yes Laurence Spates, MD  acetaminophen (TYLENOL) 500 MG tablet Take 1,000 mg by mouth every 6 (six) hours as needed for moderate pain or headache.    [provider]  bismuth subsalicylate (PEPTO BISMOL) 262 MG/15ML suspension Take 30 mLs by mouth every 6 (six) hours as needed for diarrhea or loose stools or indigestion.    [provider]  DESCOVY 200-25 MG tablet Take 1 tablet by mouth at bedtime.  10/01/16   [provider]  diclofenac Sodium (VOLTAREN) 1 % GEL Apply 2 g topically 2 (two) times daily as needed (hands). 06/04/21   [provider]  diphenhydrAMINE (SOMINEX) 25 MG tablet Take 25 mg by mouth at bedtime as needed for allergies.    [provider]  dolutegravir (TIVICAY) 50 MG tablet Take 50 mg by mouth at bedtime. 03/27/15   [provider]  gabapentin (NEURONTIN) 300 MG capsule Take 300 mg by mouth See admin instructions. 300 mg - morning and afternoon 600 mg - bedtime 12/31/17   [provider]  ketoconazole (NIZORAL) 2 % shampoo Apply 1 application. topically daily. 06/04/21   [provider]  mirtazapine (REMERON) 30 MG tablet Take 30 mg by mouth daily. 06/04/21   [provider]  oxymetazoline (AFRIN) 0.05 % nasal spray Place 1 spray into both nostrils daily as needed for congestion.    [provider]  pantoprazole (PROTONIX) 40 MG tablet Take 1 tablet (40 mg total) by mouth 2 (two) times daily. After 6 wk take once daily. 07/03/21 08/14/21  Lanae Boast, MD  promethazine (PHENERGAN) 25 MG tablet Take 1 tablet (25 mg total) by mouth every 6 (six) hours as needed for nausea or vomiting. 07/13/20   Elpidio Anis, PA-C  traZODone (DESYREL) 100 MG tablet Take 200 mg by mouth at bedtime.    [provider]  triamcinolone (KENALOG) 0.025 % ointment Apply 1 application topically daily as needed (dermatitis).    [provider]      Allergies    Penicillins, Lexapro [escitalopram], Bactrim [sulfamethoxazole-trimethoprim], and Zerit [stavudine]    Review of Systems   Review of Systems Review of systems completed and notable as per HPI.  ROS otherwise negative.   Physical Exam Updated Vital Signs BP (!) 155/84 (BP Location: Right Arm)   Pulse 97   Temp 99 F (37.2 C) (Oral)   Resp 18   SpO2 100%  Physical Exam Vitals and nursing note reviewed.  Constitutional:      General: He is not in acute distress.    Appearance: He is well-developed.  HENT:     Head: Normocephalic and atraumatic.     Nose: Nose normal.     Mouth/Throat:     Mouth: Mucous membranes are moist.     Pharynx: Oropharynx is clear.  Eyes:     Extraocular Movements: Extraocular movements  intact.     Conjunctiva/sclera: Conjunctivae normal.     Pupils: Pupils are equal, round, and reactive to light.  Cardiovascular:     Rate and Rhythm: Normal rate and regular rhythm.     Heart sounds: No murmur heard. Pulmonary:     Effort: Pulmonary effort is normal. No respiratory distress.     Breath sounds: Normal breath sounds.  Abdominal:     Palpations: Abdomen is soft.     Tenderness: There is abdominal tenderness. There is no guarding or rebound.     Comments: Mild generalized tenderness  Musculoskeletal:        General: No swelling.     Cervical back: Normal range of motion and neck supple. No rigidity or tenderness.  Skin:    General: Skin is warm and dry.     Capillary Refill: Capillary refill takes less than 2 seconds.  Neurological:     General: No focal deficit present.     Mental Status: He is alert and oriented to person, place, and time. Mental status is at baseline.     Cranial Nerves: No cranial nerve deficit.     Sensory: No sensory deficit.     Motor: No weakness.     Coordination: Coordination normal.     Gait: Gait normal.     Deep Tendon Reflexes: Reflexes normal.  Psychiatric:        Mood and Affect: Mood normal.     ED Results / Procedures / Treatments   Labs (all labs ordered are listed, but only abnormal results are displayed) Labs Reviewed  COMPREHENSIVE METABOLIC PANEL - Abnormal; Notable for the following components:      Result Value   Potassium 3.3 (*)    Glucose, Bld 115 (*)    All other components within normal limits  CBC - Abnormal; Notable for the following components:   Platelets 116 (*)    All other components within normal limits  RESP PANEL BY RT-PCR (RSV, FLU A&B, COVID)  RVPGX2  LIPASE, BLOOD    EKG None  Radiology DG Chest 2 View  Result Date: 11/10/2022 CLINICAL DATA:  Chills and sweats. EXAM: CHEST - 2 VIEW COMPARISON:  None Available. FINDINGS: The heart size and mediastinal contours are within normal limits. Both  lungs are clear. The visualized skeletal structures are unremarkable. IMPRESSION: Negative two view chest x-ray Electronically Signed   By: Marin Roberts M.D.   On: 11/10/2022 15:20   CT ABDOMEN PELVIS W CONTRAST  Result Date: 11/10/2022 CLINICAL DATA:  Acute abdominal pain and nausea. EXAM: CT ABDOMEN AND PELVIS WITH CONTRAST TECHNIQUE: Multidetector CT imaging of the abdomen and pelvis was performed using the standard protocol following bolus administration of intravenous contrast. RADIATION DOSE REDUCTION: This exam was performed according to the departmental dose-optimization program which includes automated exposure  control, adjustment of the mA and/or kV according to patient size and/or use of iterative reconstruction technique. CONTRAST:  75mL OMNIPAQUE IOHEXOL 350 MG/ML SOLN COMPARISON:  06/30/2021 FINDINGS: Lower Chest: No acute findings. Hepatobiliary: No suspicious hepatic masses identified. Prior cholecystectomy. No evidence of biliary obstruction. Pancreas:  No mass or inflammatory changes. Spleen: Within normal limits in size and appearance. Adrenals/Urinary Tract: No suspicious masses identified. No evidence of ureteral calculi or hydronephrosis. Stomach/Bowel: No evidence of obstruction, inflammatory process or abnormal fluid collections. Normal appendix visualized. Vascular/Lymphatic: No pathologically enlarged lymph nodes. No acute vascular findings. 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 Other:  None. Musculoskeletal:  No suspicious bone lesions identified. IMPRESSION: No acute findings or other significant abnormality. Electronically Signed   By: Danae Orleans M.D.   On: 11/10/2022 14:58   CT Head Wo Contrast  Result Date: 11/10/2022 CLINICAL DATA:  Headache, increasing frequency or severity EXAM: CT HEAD WITHOUT CONTRAST TECHNIQUE: Contiguous axial images were obtained from the base of the skull through the vertex without  intravenous contrast. RADIATION DOSE REDUCTION: This exam was performed according to the departmental dose-optimization program which includes automated exposure control, adjustment of the mA and/or kV according to patient size and/or use of iterative reconstruction technique. COMPARISON:  None Available. FINDINGS: Brain: No evidence of acute infarction, hemorrhage, hydrocephalus, extra-axial collection or mass lesion/mass effect. Contrast is present within the vascular structures from previous contrast enhanced exam. Vascular: No unexpected calcification. Skull: Normal. Negative for fracture or focal lesion. Sinuses/Orbits: No acute finding. Other: None. IMPRESSION: No acute intracranial findings. Electronically Signed   By: Duanne Guess D.O.   On: 11/10/2022 14:57    Procedures Procedures    Medications Ordered in ED Medications  ondansetron (ZOFRAN-ODT) disintegrating tablet 4 mg (4 mg Oral Given 11/10/22 1101)  lactated ringers bolus 1,000 mL (0 mLs Intravenous Stopped 11/10/22 1436)  metoCLOPramide (REGLAN) injection 10 mg (10 mg Intravenous Given 11/10/22 1327)  iohexol (OMNIPAQUE) 350 MG/ML injection 75 mL (75 mLs Intravenous Contrast Given 11/10/22 1433)  acetaminophen (TYLENOL) tablet 1,000 mg (1,000 mg Oral Given 11/10/22 1536)  prochlorperazine (COMPAZINE) injection 5 mg (5 mg Intravenous Given 11/10/22 1536)    ED Course/ Medical Decision Making/ A&P                                 Medical Decision Making Amount and/or Complexity of Data Reviewed Labs: ordered. Radiology: ordered.  Risk OTC drugs. Prescription drug management.   Medical Decision Making:   ALEM FINE is a 57 y.o. male who presented to the ED today with abdominal pain, headache, emesis.  Vital signs reviewed.  On exam he is well-appearing.  He has mild abdominal tenderness but no peritonitis.  Reports several days of vomiting, headache, abdominal pain is mildly tender on exam.  Symptoms are nonspecific,  although does have HIV and reports CD4 count is usually just over 200 despite taking his medications.  Consider possible COVID or viral infection, also consider intracranial mass or bleeding.  Obtain CT head.  He has no meningismus, low concern for CNS infection at this point.  He does have some abdominal tenderness as well so obtain CT of the abdomen evaluate for obstruction, appendicitis, cholecystitis, colitis.   Patient placed on continuous vitals and telemetry monitoring while in ED which was reviewed periodically.  Reviewed and confirmed nursing documentation for past medical history, family history, social history.  Reassessment and Plan:   Workup reassuring.  Mild hypokalemia.  CBC was stable thrombocytopenia but no leukocytosis or leukopenia.  COVID and flu are negative.  CT of the abdomen pelvis is unremarkable, as is his CT head.  Chest x-ray reassuring.  Still has mild frontal headache, but improved after medication and fluids.  He reports mild generalized headache, vomiting, worsening diarrhea, chills.  I wonder if he could have a possible viral infection.  I do not see any signs of sepsis or significant bacterial infection at this time.  He reports good adherence to his antiretroviral medications and his CD4 count has been consistently over 200, I do not think needs LP or other workup for opportunistic infection at this time.  I recommend he follow closely with his PCP and infectious disease.  Given strict return precautions.  Discharged in stable condition.   Patient's presentation is most consistent with acute complicated illness / injury requiring diagnostic workup.           Final Clinical Impression(s) / ED Diagnoses Final diagnoses:  Nausea and vomiting, unspecified vomiting type    Rx / DC Orders ED Discharge Orders          Ordered    ondansetron (ZOFRAN-ODT) 4 MG disintegrating tablet  Every 8 hours PRN        11/10/22 1551              Laurence Spates,  MD 11/10/22 [phone]

## 2022-11-10 NOTE — ED Notes (Signed)
Pt ambulated to gurney with stable gait. NAD noted at this time. Support person at bedside.

## 2022-11-12 ENCOUNTER — Emergency Department (HOSPITAL_COMMUNITY)
Admission: EM | Admit: 2022-11-12 | Discharge: 2022-11-13 | Disposition: A | Discharge status: Home / Self Care | Payer: Medicare Other | Attending: Emergency Medicine | Admitting: Emergency Medicine

## 2022-11-12 ENCOUNTER — Encounter (HOSPITAL_COMMUNITY): Payer: Self-pay | Admitting: Emergency Medicine

## 2022-11-12 ENCOUNTER — Other Ambulatory Visit: Payer: Self-pay

## 2022-11-12 ENCOUNTER — Emergency Department (HOSPITAL_COMMUNITY): Payer: Medicare Other

## 2022-11-12 DIAGNOSIS — Z79899 Other long term (current) drug therapy: Secondary | ICD-10-CM | POA: Diagnosis not present

## 2022-11-12 DIAGNOSIS — Z21 Asymptomatic human immunodeficiency virus [HIV] infection status: Secondary | ICD-10-CM | POA: Insufficient documentation

## 2022-11-12 DIAGNOSIS — R42 Dizziness and giddiness: Secondary | ICD-10-CM | POA: Diagnosis present

## 2022-11-12 DIAGNOSIS — I10 Essential (primary) hypertension: Secondary | ICD-10-CM | POA: Diagnosis not present

## 2022-11-12 DIAGNOSIS — I160 Hypertensive urgency: Secondary | ICD-10-CM | POA: Diagnosis not present

## 2022-11-12 LAB — CBC WITH DIFFERENTIAL/PLATELET
Abs Immature Granulocytes: 0.01 10*3/uL (ref 0.00–0.07)
Basophils Absolute: 0 10*3/uL (ref 0.0–0.1)
Basophils Relative: 1 %
Eosinophils Absolute: 0.1 10*3/uL (ref 0.0–0.5)
Eosinophils Relative: 1 %
HCT: 40.2 % (ref 39.0–52.0)
Hemoglobin: 13.8 g/dL (ref 13.0–17.0)
Immature Granulocytes: 0 %
Lymphocytes Relative: 16 %
Lymphs Abs: 0.7 10*3/uL (ref 0.7–4.0)
MCH: 32.9 pg (ref 26.0–34.0)
MCHC: 34.3 g/dL (ref 30.0–36.0)
MCV: 95.7 fL (ref 80.0–100.0)
Monocytes Absolute: 0.6 10*3/uL (ref 0.1–1.0)
Monocytes Relative: 13 %
Neutro Abs: 3.2 10*3/uL (ref 1.7–7.7)
Neutrophils Relative %: 69 %
Platelets: 100 10*3/uL — ABNORMAL LOW (ref 150–400)
RBC: 4.2 MIL/uL — ABNORMAL LOW (ref 4.22–5.81)
RDW: 12.2 % (ref 11.5–15.5)
WBC: 4.6 10*3/uL (ref 4.0–10.5)
nRBC: 0 % (ref 0.0–0.2)

## 2022-11-12 LAB — COMPREHENSIVE METABOLIC PANEL
ALT: 19 U/L (ref 0–44)
AST: 21 U/L (ref 15–41)
Albumin: 4.5 g/dL (ref 3.5–5.0)
Alkaline Phosphatase: 56 U/L (ref 38–126)
Anion gap: 16 — ABNORMAL HIGH (ref 5–15)
BUN: 7 mg/dL (ref 6–20)
CO2: 23 mmol/L (ref 22–32)
Calcium: 9.1 mg/dL (ref 8.9–10.3)
Chloride: 98 mmol/L (ref 98–111)
Creatinine, Ser: 0.84 mg/dL (ref 0.61–1.24)
GFR, Estimated: >60 mL/min (ref >60)
Glucose, Bld: 155 mg/dL — ABNORMAL HIGH (ref 70–99)
Potassium: 2.9 mmol/L — ABNORMAL LOW (ref 3.5–5.1)
Sodium: 137 mmol/L (ref 135–145)
Total Bilirubin: 1.1 mg/dL (ref 0.3–1.2)
Total Protein: 6.6 g/dL (ref 6.5–8.1)

## 2022-11-12 LAB — RAPID URINE DRUG SCREEN, HOSP PERFORMED
Amphetamines: NOT DETECTED
Barbiturates: NOT DETECTED
Benzodiazepines: NOT DETECTED
Cocaine: NOT DETECTED
Opiates: NOT DETECTED
Tetrahydrocannabinol: POSITIVE — AB

## 2022-11-12 LAB — URINALYSIS, ROUTINE W REFLEX MICROSCOPIC
Bilirubin Urine: NEGATIVE
Glucose, UA: NEGATIVE mg/dL
Hgb urine dipstick: NEGATIVE
Ketones, ur: 40 mg/dL — AB
Leukocytes,Ua: NEGATIVE
Nitrite: NEGATIVE
Protein, ur: NEGATIVE mg/dL
Specific Gravity, Urine: 1.01 (ref 1.005–1.030)
pH: 7.5 (ref 5.0–8.0)

## 2022-11-12 LAB — TROPONIN I (HIGH SENSITIVITY): Troponin I (High Sensitivity): 6 ng/L (ref <18)

## 2022-11-12 MED ORDER — KETOROLAC TROMETHAMINE 15 MG/ML IJ SOLN
15.0000 mg | Freq: Once | INTRAMUSCULAR | Status: AC
  Administered 2022-11-12: 15 mg via INTRAVENOUS
  Filled 2022-11-12: qty 1

## 2022-11-12 MED ORDER — AMLODIPINE BESYLATE 5 MG PO TABS
5.0000 mg | ORAL_TABLET | Freq: Once | ORAL | Status: AC
  Administered 2022-11-12: 5 mg via ORAL
  Filled 2022-11-12: qty 1

## 2022-11-12 MED ORDER — AMLODIPINE BESYLATE 10 MG PO TABS: 10.0000 mg | ORAL_TABLET | Freq: Every day | ORAL | 0 refills | Status: AC

## 2022-11-12 MED ORDER — POTASSIUM CHLORIDE CRYS ER 20 MEQ PO TBCR
40.0000 meq | EXTENDED_RELEASE_TABLET | Freq: Once | ORAL | Status: AC
  Administered 2022-11-12: 40 meq via ORAL
  Filled 2022-11-12: qty 2

## 2022-11-12 MED ORDER — ACETAMINOPHEN 500 MG PO TABS
1000.0000 mg | ORAL_TABLET | Freq: Once | ORAL | Status: AC
  Administered 2022-11-12: 1000 mg via ORAL
  Filled 2022-11-12: qty 2

## 2022-11-12 MED ORDER — AMLODIPINE BESYLATE 10 MG PO TABS: 10.0000 mg | ORAL_TABLET | Freq: Every day | ORAL | 0 refills | Status: DC

## 2022-11-12 MED ORDER — MAGNESIUM SULFATE 2 GM/50ML IV SOLN
2.0000 g | Freq: Once | INTRAVENOUS | Status: AC
  Administered 2022-11-12: 2 g via INTRAVENOUS
  Filled 2022-11-12: qty 50

## 2022-11-12 MED ORDER — SODIUM CHLORIDE 0.9 % IV BOLUS
1000.0000 mL | Freq: Once | INTRAVENOUS | Status: AC
  Administered 2022-11-12: 1000 mL via INTRAVENOUS

## 2022-11-12 NOTE — ED Provider Notes (Signed)
[place] EMERGENCY DEPARTMENT AT [facility] Provider Note   CSN: [id number] Arrival date & time: 11/12/22  1740     History  Chief Complaint  Patient presents with   Dizziness   Hypertension    Roy Trujillo is a 57 y.o. male.  Patient is a 57 year old male with past medical history of anxiety, depression, HIV, newly diagnosed hypertension about a month and a half ago presenting for complaints of blurred vision, dizziness, and headaches.  He states over the last week and a half he went to the emergency department for generalized abdominal pain, nausea, vomiting.  States since then he has had progressive headache as well as dizziness and blurred vision.  He is takes Norvasc 5 mg daily without any missed doses.  On arrival patient's blood pressure was 225/184.  His partner states that he struggles with alcoholism.  Last drink was 1 week ago.  States he also struggles with high anxiety and does not like to go out in public.  The history is provided by the patient. No language interpreter was used.  Dizziness Associated symptoms: headaches   Associated symptoms: no chest pain, no palpitations, no shortness of breath and no vomiting   Hypertension Associated symptoms include headaches. Pertinent negatives include no chest pain, no abdominal pain and no shortness of breath.       Home Medications Prior to Admission medications   Medication Sig Start Date End Date Taking? Authorizing Provider  amLODipine (NORVASC) 10 MG tablet Take 1 tablet (10 mg total) by mouth daily. 11/12/22  Yes Edwin Dada P, DO  acetaminophen (TYLENOL) 500 MG tablet Take 1,000 mg by mouth every 6 (six) hours as needed for moderate pain or headache.    [provider]  bismuth subsalicylate (PEPTO BISMOL) 262 MG/15ML suspension Take 30 mLs by mouth every 6 (six) hours as needed for diarrhea or loose stools or indigestion.    [provider]  DESCOVY 200-25 MG tablet Take 1  tablet by mouth at bedtime.  10/01/16   [provider]  diclofenac Sodium (VOLTAREN) 1 % GEL Apply 2 g topically 2 (two) times daily as needed (hands). 06/04/21   [provider]  diphenhydrAMINE (SOMINEX) 25 MG tablet Take 25 mg by mouth at bedtime as needed for allergies.    [provider]  dolutegravir (TIVICAY) 50 MG tablet Take 50 mg by mouth at bedtime. 03/27/15   [provider]  gabapentin (NEURONTIN) 300 MG capsule Take 300 mg by mouth See admin instructions. 300 mg - morning and afternoon 600 mg - bedtime 12/31/17   [provider]  ketoconazole (NIZORAL) 2 % shampoo Apply 1 application. topically daily. 06/04/21   [provider]  mirtazapine (REMERON) 30 MG tablet Take 30 mg by mouth daily. 06/04/21   [provider]  ondansetron (ZOFRAN-ODT) 4 MG disintegrating tablet Take 1 tablet (4 mg total) by mouth every 8 (eight) hours as needed for nausea or vomiting. 11/10/22   Laurence Spates, MD  oxymetazoline (AFRIN) 0.05 % nasal spray Place 1 spray into both nostrils daily as needed for congestion.    [provider]  pantoprazole (PROTONIX) 40 MG tablet Take 1 tablet (40 mg total) by mouth 2 (two) times daily. After 6 wk take once daily. 07/03/21 08/14/21  Lanae Boast, MD  promethazine (PHENERGAN) 25 MG tablet Take 1 tablet (25 mg total) by mouth every 6 (six) hours as needed for nausea or vomiting. 07/13/20   Elpidio Anis,  PA-C  traZODone (DESYREL) 100 MG tablet Take 200 mg by mouth at bedtime.    [provider]  triamcinolone (KENALOG) 0.025 % ointment Apply 1 application topically daily as needed (dermatitis).    [provider]      Allergies    Penicillins, Lexapro [escitalopram], Bactrim [sulfamethoxazole-trimethoprim], and Zerit [stavudine]    Review of Systems   Review of Systems  Constitutional:  Negative for chills and fever.  HENT:  Negative for ear pain and sore throat.   Eyes:  Positive for  visual disturbance. Negative for pain.  Respiratory:  Negative for cough and shortness of breath.   Cardiovascular:  Negative for chest pain and palpitations.  Gastrointestinal:  Negative for abdominal pain and vomiting.  Genitourinary:  Negative for dysuria and hematuria.  Musculoskeletal:  Negative for arthralgias and back pain.  Skin:  Negative for color change and rash.  Neurological:  Positive for dizziness and headaches. Negative for seizures and syncope.  All other systems reviewed and are negative.   Physical Exam Updated Vital Signs BP (!) 169/87   Pulse 89   Temp 98.2 F (36.8 C)   Resp 14   SpO2 98%  Physical Exam Vitals and nursing note reviewed.  Constitutional:      General: He is not in acute distress.    Appearance: He is well-developed.  HENT:     Head: Normocephalic and atraumatic.  Eyes:     Conjunctiva/sclera: Conjunctivae normal.  Cardiovascular:     Rate and Rhythm: Normal rate and regular rhythm.     Heart sounds: No murmur heard. Pulmonary:     Effort: Pulmonary effort is normal. No respiratory distress.     Breath sounds: Normal breath sounds.  Abdominal:     Palpations: Abdomen is soft.     Tenderness: There is no abdominal tenderness.  Musculoskeletal:        General: No swelling.     Cervical back: Neck supple.  Skin:    General: Skin is warm and dry.     Capillary Refill: Capillary refill takes less than 2 seconds.  Neurological:     Mental Status: He is alert and oriented to person, place, and time.     GCS: GCS eye subscore is 4. GCS verbal subscore is 5. GCS motor subscore is 6.     Cranial Nerves: Cranial nerves 2-12 are intact.     Sensory: Sensation is intact.     Motor: Motor function is intact.     Coordination: Coordination is intact.  Psychiatric:        Mood and Affect: Mood normal.     ED Results / Procedures / Treatments   Labs (all labs ordered are listed, but only abnormal results are displayed) Labs Reviewed  CBC  WITH DIFFERENTIAL/PLATELET - Abnormal; Notable for the following components:      Result Value   RBC 4.20 (*)    Platelets 100 (*)    All other components within normal limits  COMPREHENSIVE METABOLIC PANEL - Abnormal; Notable for the following components:   Potassium 2.9 (*)    Glucose, Bld 155 (*)    Anion gap 16 (*)    All other components within normal limits  URINALYSIS, ROUTINE W REFLEX MICROSCOPIC - Abnormal; Notable for the following components:   Ketones, ur 40 (*)    All other components within normal limits  RAPID URINE DRUG SCREEN, HOSP PERFORMED - Abnormal; Notable for the following components:   Tetrahydrocannabinol POSITIVE (*)  All other components within normal limits  TROPONIN I (HIGH SENSITIVITY)  TROPONIN I (HIGH SENSITIVITY)    EKG EKG Interpretation Date/Time:  Tuesday November 12 2022 17:36:18 EDT Ventricular Rate:  107 PR Interval:  152 QRS Duration:  84 QT Interval:  312 QTC Calculation: 416 R Axis:   77  Text Interpretation: Sinus tachycardia Biatrial enlargement Nonspecific ST abnormality Abnormal ECG When compared with ECG of 12-Feb-2018 07:19, PREVIOUS ECG IS PRESENT Confirmed by Edwin Dada ([id number]) on 11/12/2022 11:19:14 PM  Radiology DG Chest 2 View  Result Date: 11/12/2022 CLINICAL DATA:  Palpitation and dizziness. EXAM: CHEST - 2 VIEW COMPARISON:  Chest radiograph dated 11/10/2022. FINDINGS: The heart size and mediastinal contours are within normal limits. Both lungs are clear. The visualized skeletal structures are unremarkable. IMPRESSION: No active cardiopulmonary disease. Electronically Signed   By: Elgie Collard M.D.   On: 11/12/2022 19:09    Procedures Procedures    Medications Ordered in ED Medications  acetaminophen (TYLENOL) tablet 1,000 mg (1,000 mg Oral Given 11/12/22 1808)  potassium chloride SA (KLOR-CON M) CR tablet 40 mEq (40 mEq Oral Given 11/12/22 2125)  sodium chloride 0.9 % bolus 1,000 mL (0 mLs Intravenous Stopped  11/12/22 2241)  amLODipine (NORVASC) tablet 5 mg (5 mg Oral Given 11/12/22 2125)  ketorolac (TORADOL) 15 MG/ML injection 15 mg (15 mg Intravenous Given 11/12/22 2157)  magnesium sulfate IVPB 2 g 50 mL (0 g Intravenous Stopped 11/12/22 2302)    ED Course/ Medical Decision Making/ A&P                                 Medical Decision Making Risk Prescription drug management.    57 year old male with past medical history of anxiety, depression, HIV, newly diagnosed hypertension about a month and a half ago presenting for complaints of blurred vision, dizziness, and headaches.  Patient is alert and oriented x 3, hypertensive at 225/184.  Otherwise stable vital signs.  Without intervention patient's blood pressure came down to 168/82.  Stable EKG.  Stable troponins.  Stable electrolytes.  Patient grossly dehydrated on exam.  IV fluids given and medications given for headache.  Norvasc 5 mg repeated.  Patient diagnosed with hypertensive urgency without endorgan damage at this time.  Recommended for close follow-up with primary care physician for further blood pressure management.  Recommended to take Norvasc 10 mg daily instead of 5.  Patient and partner states they will follow with her primary care physician for outpatient therapy.  No suicidal ideation at this time.  No mania.        Final Clinical Impression(s) / ED Diagnoses Final diagnoses:  Hypertensive urgency    Rx / DC Orders ED Discharge Orders          Ordered    amLODipine (NORVASC) 10 MG tablet  Daily        11/12/22 2321              Franne Forts, DO 11/12/22 2321

## 2022-11-12 NOTE — ED Notes (Signed)
Dr. Wallace Cullens to D/C repeat troponin, she reports she does not need repeat troponin

## 2022-11-12 NOTE — ED Triage Notes (Signed)
Patient arrives for further eval of symptoms for which he was evaluated on Sunday. Also states dizziness beginning today at 1200 and has been taking his blood pressure at home and has been >190 for the last two hours.  225/184 in triage and states compliance with medications. Complains of headache and bilateral hand pain.

## 2022-11-12 NOTE — Discharge Instructions (Signed)
Please increase your home prescription of amlodipine to 10 mg daily.  I have updated your prescription with a new 1 and sent to your pharmacy so you do not run out of pills.  Follow-up with your primary care physician for further management of hypertension.

## 2022-11-12 NOTE — ED Provider Triage Note (Signed)
Emergency Medicine Provider Triage Evaluation Note  Roy Trujillo , a 57 y.o. male  was evaluated in triage.  Pt complains of dizziness, blurred vision, and severe headache over the last week.  Seen for the same 2 days ago.  Reportedly had an unremarkable workup including a CT head.  Mended today that his blood pressure was elevated to a systolic of 190s.  Has been compliant with his antihypertensive regimen which includes amlodipine 5 mg.  No chest pain, no shortness of breath.  No focal weakness, speech changes, gait changes.  Reports a frontotemporal headache bilaterally which is intermittently severe.  No fall or head trauma.  No history of heart problems.  Review of Systems  Positive: See HPI Negative: See HPI  Physical Exam  BP (!) 225/184   Pulse (!) 116   Temp 98.2 F (36.8 C)   Resp (!) 22   SpO2 98%  Gen:   Awake, no distress   Resp:  Normal effort lungs clear to auscultation MSK:   Moves extremities without difficulty no lower extremity edema Other:  Tachycardia with regular rhythm, nonfocal neuro exam, anxious but answering questions appropriately  Medical Decision Making  Medically screening exam initiated at 6:04 PM.  Appropriate orders placed.  Darren A Nicholson-Loy was informed that the remainder of the evaluation will be completed by another provider, this initial triage assessment does not replace that evaluation, and the importance of remaining in the ED until their evaluation is complete.     Tonette Lederer, PA-C 11/12/22 1806

## 2023-02-05 ENCOUNTER — Telehealth (HOSPITAL_COMMUNITY): Payer: Self-pay

## 2023-02-05 ENCOUNTER — Telehealth (HOSPITAL_COMMUNITY): Payer: Self-pay | Admitting: Licensed Clinical Social Worker

## 2023-02-05 NOTE — Telephone Encounter (Signed)
The therapist receives a voicemail from this client inquiring about SA IOP.  Myrna Blazer, MA, LCSW, [facility], LCAS 02/05/2023

## 2023-02-05 NOTE — Telephone Encounter (Signed)
This therapist returns the call from [name] calling the front desk earlier inquiring about substance use services.  Therapists reached voice mail and leaves a HIPAA compliant voicemail requesting a return call.  Roy Eisenmenger, MS, LMFT, LCAS

## 2023-02-10 ENCOUNTER — Ambulatory Visit (HOSPITAL_COMMUNITY): Payer: Medicare Other

## 2023-02-19 ENCOUNTER — Emergency Department (HOSPITAL_COMMUNITY): Payer: Medicare Other

## 2023-02-19 ENCOUNTER — Emergency Department (HOSPITAL_COMMUNITY)
Admission: EM | Admit: 2023-02-19 | Discharge: 2023-02-19 | Disposition: A | Discharge status: Home / Self Care | Payer: Medicare Other | Attending: Emergency Medicine | Admitting: Emergency Medicine

## 2023-02-19 ENCOUNTER — Encounter (HOSPITAL_COMMUNITY): Payer: Self-pay | Admitting: Emergency Medicine

## 2023-02-19 DIAGNOSIS — R4182 Altered mental status, unspecified: Secondary | ICD-10-CM | POA: Insufficient documentation

## 2023-02-19 DIAGNOSIS — R404 Transient alteration of awareness: Secondary | ICD-10-CM

## 2023-02-19 DIAGNOSIS — B20 Human immunodeficiency virus [HIV] disease: Secondary | ICD-10-CM | POA: Diagnosis not present

## 2023-02-19 LAB — URINALYSIS, W/ REFLEX TO CULTURE (INFECTION SUSPECTED)
Bilirubin Urine: NEGATIVE
Glucose, UA: NEGATIVE mg/dL
Hgb urine dipstick: NEGATIVE
Ketones, ur: NEGATIVE mg/dL
Leukocytes,Ua: NEGATIVE
Nitrite: NEGATIVE
Protein, ur: NEGATIVE mg/dL
Specific Gravity, Urine: 1.014 (ref 1.005–1.030)
pH: 8 (ref 5.0–8.0)

## 2023-02-19 LAB — RAPID URINE DRUG SCREEN, HOSP PERFORMED
Amphetamines: NOT DETECTED
Barbiturates: NOT DETECTED
Benzodiazepines: NOT DETECTED
Cocaine: NOT DETECTED
Opiates: NOT DETECTED
Tetrahydrocannabinol: POSITIVE — AB

## 2023-02-19 LAB — ETHANOL: Alcohol, Ethyl (B): <10 mg/dL (ref <10)

## 2023-02-19 LAB — TROPONIN I (HIGH SENSITIVITY): Troponin I (High Sensitivity): 5 ng/L (ref <18)

## 2023-02-19 LAB — CBC
HCT: 39.6 % (ref 39.0–52.0)
Hemoglobin: 13.4 g/dL (ref 13.0–17.0)
MCH: 34.1 pg — ABNORMAL HIGH (ref 26.0–34.0)
MCHC: 33.8 g/dL (ref 30.0–36.0)
MCV: 100.8 fL — ABNORMAL HIGH (ref 80.0–100.0)
Platelets: 105 10*3/uL — ABNORMAL LOW (ref 150–400)
RBC: 3.93 MIL/uL — ABNORMAL LOW (ref 4.22–5.81)
RDW: 13 % (ref 11.5–15.5)
WBC: 9.5 10*3/uL (ref 4.0–10.5)
nRBC: 0 % (ref 0.0–0.2)

## 2023-02-19 LAB — COMPREHENSIVE METABOLIC PANEL
ALT: 54 U/L — ABNORMAL HIGH (ref 0–44)
AST: 38 U/L (ref 15–41)
Albumin: 4 g/dL (ref 3.5–5.0)
Alkaline Phosphatase: 60 U/L (ref 38–126)
Anion gap: 11 (ref 5–15)
BUN: 9 mg/dL (ref 6–20)
CO2: 24 mmol/L (ref 22–32)
Calcium: 9.6 mg/dL (ref 8.9–10.3)
Chloride: 106 mmol/L (ref 98–111)
Creatinine, Ser: 0.9 mg/dL (ref 0.61–1.24)
GFR, Estimated: >60 mL/min (ref >60)
Glucose, Bld: 116 mg/dL — ABNORMAL HIGH (ref 70–99)
Potassium: 4.2 mmol/L (ref 3.5–5.1)
Sodium: 141 mmol/L (ref 135–145)
Total Bilirubin: 0.5 mg/dL (ref <1.2)
Total Protein: 6.4 g/dL — ABNORMAL LOW (ref 6.5–8.1)

## 2023-02-19 LAB — AMMONIA: Ammonia: <10 umol/L (ref 9–35)

## 2023-02-19 LAB — CBG MONITORING, ED: Glucose-Capillary: 119 mg/dL — ABNORMAL HIGH (ref 70–99)

## 2023-02-19 LAB — T-HELPER CELLS (CD4) COUNT (NOT AT ARMC)
CD4 % Helper T Cell: 13 % — ABNORMAL LOW (ref 33–65)
CD4 T Cell Abs: 70 /uL — ABNORMAL LOW (ref 400–1790)

## 2023-02-19 LAB — SALICYLATE LEVEL: Salicylate Lvl: <7 mg/dL — ABNORMAL LOW (ref 7.0–30.0)

## 2023-02-19 LAB — I-STAT CG4 LACTIC ACID, ED: Lactic Acid, Venous: 1.3 mmol/L (ref 0.5–1.9)

## 2023-02-19 LAB — ACETAMINOPHEN LEVEL: Acetaminophen (Tylenol), Serum: <10 ug/mL — ABNORMAL LOW (ref 10–30)

## 2023-02-19 MED ORDER — LORAZEPAM 2 MG/ML IJ SOLN: 1.0000 mg | Freq: Once | INTRAMUSCULAR | Status: DC

## 2023-02-19 MED ORDER — ZIPRASIDONE MESYLATE 20 MG IM SOLR
20.0000 mg | Freq: Once | INTRAMUSCULAR | Status: AC
  Administered 2023-02-19: 20 mg via INTRAMUSCULAR
  Filled 2023-02-19: qty 20

## 2023-02-19 MED ORDER — HALOPERIDOL LACTATE 5 MG/ML IJ SOLN
10.0000 mg | Freq: Once | INTRAMUSCULAR | Status: AC
  Administered 2023-02-19: 10 mg via INTRAVENOUS
  Filled 2023-02-19: qty 2

## 2023-02-19 MED ORDER — LORAZEPAM 2 MG/ML IJ SOLN
2.0000 mg | Freq: Once | INTRAMUSCULAR | Status: AC
  Administered 2023-02-19: 2 mg via INTRAMUSCULAR
  Filled 2023-02-19: qty 1

## 2023-02-19 MED ORDER — GADOBUTROL 1 MMOL/ML IV SOLN
5.0000 mL | Freq: Once | INTRAVENOUS | Status: AC | PRN
Start: 2023-02-19 — End: 2023-02-19
  Administered 2023-02-19: 5 mL via INTRAVENOUS

## 2023-02-19 MED ORDER — DIPHENHYDRAMINE HCL 50 MG/ML IJ SOLN
25.0000 mg | Freq: Once | INTRAMUSCULAR | Status: AC
  Administered 2023-02-19: 25 mg via INTRAVENOUS
  Filled 2023-02-19: qty 1

## 2023-02-19 MED ORDER — STERILE WATER FOR INJECTION IJ SOLN
INTRAMUSCULAR | Status: AC
  Filled 2023-02-19: qty 10

## 2023-02-19 NOTE — Discharge Instructions (Addendum)
Make sure to follow-up with ID  Return for any new or worsening symptoms

## 2023-02-19 NOTE — ED Triage Notes (Signed)
Pt here from home with c/o aloc was given 5mg  of haldol I'm by ems , pt arrives to the ED ale  And oriented times 2 on arrival to the ED

## 2023-02-19 NOTE — ED Notes (Signed)
Patient transported to MRI 

## 2023-02-19 NOTE — ED Provider Notes (Addendum)
[place] EMERGENCY DEPARTMENT AT [facility] Provider Note   CSN: [id number] Arrival date & time: 02/19/23  1027     History  Chief Complaint  Patient presents with   Altered Mental Status   HPI Roy Trujillo is a 57 y.o. male with history of HIV, acute encephalopathy depression with psychotic features, long-term NSAID use presenting for altered mental status.  Per EMS, was transferred here from home was given 5 mg of Haldol on initial encounter due to disorientation and erratic behavior.  Patient during my encounter seemed to be confused, unable to answer any questions.  Was able to follow commands.  Moving all extremities.  Eyes equal round reactive to light.  Patient did deny chest pain and abdominal pain by nodding.        Home Medications Prior to Admission medications   Medication Sig Start Date End Date Taking? Authorizing Provider  acetaminophen (TYLENOL) 500 MG tablet Take 1,000 mg by mouth every 6 (six) hours as needed for moderate pain or headache.    [provider]  amLODipine (NORVASC) 10 MG tablet Take 1 tablet (10 mg total) by mouth daily. 11/12/22   Edwin Dada P, DO  atenolol (TENORMIN) 25 MG tablet Take 1 tablet by mouth daily. 12/05/22 12/05/23  [provider]  bismuth subsalicylate (PEPTO BISMOL) 262 MG/15ML suspension Take 30 mLs by mouth every 6 (six) hours as needed for diarrhea or loose stools or indigestion.    [provider]  busPIRone (BUSPAR) 10 MG tablet Take 10 mg by mouth 2 (two) times daily. 09/12/22   [provider]  DESCOVY 200-25 MG tablet Take 1 tablet by mouth at bedtime.  10/01/16   [provider]  diclofenac Sodium (VOLTAREN) 1 % GEL Apply 2 g topically 2 (two) times daily as needed (hands). 06/04/21   [provider]  diphenhydrAMINE (SOMINEX) 25 MG tablet Take 25 mg by mouth at bedtime as needed for allergies.    [provider]  dolutegravir (TIVICAY) 50 MG  tablet Take 50 mg by mouth at bedtime. 03/27/15   [provider]  fluticasone (FLONASE) 50 MCG/ACT nasal spray Place 2 sprays into both nostrils daily. 12/09/22   [provider]  gabapentin (NEURONTIN) 300 MG capsule Take 300 mg by mouth See admin instructions. 300 mg - morning and afternoon 600 mg - bedtime 12/31/17   [provider]  ketoconazole (NIZORAL) 2 % shampoo Apply 1 application. topically daily. 06/04/21   [provider]  mirtazapine (REMERON) 30 MG tablet Take 30 mg by mouth daily. 06/04/21   [provider]  ondansetron (ZOFRAN-ODT) 4 MG disintegrating tablet Take 1 tablet (4 mg total) by mouth every 8 (eight) hours as needed for nausea or vomiting. 11/10/22   Laurence Spates, MD  oxymetazoline (AFRIN) 0.05 % nasal spray Place 1 spray into both nostrils daily as needed for congestion.    [provider]  pantoprazole (PROTONIX) 40 MG tablet Take 1 tablet (40 mg total) by mouth 2 (two) times daily. After 6 wk take once daily. 07/03/21 02/19/23  Lanae Boast, MD  promethazine (PHENERGAN) 25 MG tablet Take 1 tablet (25 mg total) by mouth every 6 (six) hours as needed for nausea or vomiting. 07/13/20   Elpidio Anis, PA-C  traZODone (DESYREL) 100 MG tablet Take 200 mg by mouth at bedtime.    [provider]  triamcinolone (KENALOG) 0.025 % ointment Apply 1 application topically daily as needed (dermatitis).  [provider]  zolpidem (AMBIEN) 5 MG tablet Take 5 mg by mouth at bedtime as needed.    [provider]      Allergies    Penicillins, Lexapro [escitalopram], Bactrim [sulfamethoxazole-trimethoprim], and Zerit [stavudine]    Review of Systems   See HPI   Physical Exam   Vitals:   02/19/23 1300 02/19/23 1445  BP: (!) 143/59 (!) 151/69  Pulse: 83 67  Resp: 14 18  Temp:    SpO2: 100% 99%    CONSTITUTIONAL:  well-appearing, NAD, agitated NEURO: Agitated, disoriented, only responding to questions  with nodding yes or no, moving all extremities. EYES:  eyes equal and reactive ENT/NECK:  Supple, no stridor  CARDIO:  regular rate and rhythm, appears well-perfused  PULM:  No respiratory distress, CTAB GI/GU:  non-distended, soft, non tender MSK/SPINE:  No gross deformities, no edema, moves all extremities  SKIN:  no rash, atraumatic   *Additional and/or pertinent findings included in MDM below    ED Results / Procedures / Treatments   Labs (all labs ordered are listed, but only abnormal results are displayed) Labs Reviewed  COMPREHENSIVE METABOLIC PANEL - Abnormal; Notable for the following components:      Result Value   Glucose, Bld 116 (*)    Total Protein 6.4 (*)    ALT 54 (*)    All other components within normal limits  CBC - Abnormal; Notable for the following components:   RBC 3.93 (*)    MCV 100.8 (*)    MCH 34.1 (*)    Platelets 105 (*)    All other components within normal limits  RAPID URINE DRUG SCREEN, HOSP PERFORMED - Abnormal; Notable for the following components:   Tetrahydrocannabinol POSITIVE (*)    All other components within normal limits  URINALYSIS, W/ REFLEX TO CULTURE (INFECTION SUSPECTED) - Abnormal; Notable for the following components:   APPearance CLOUDY (*)    Bacteria, UA FEW (*)    All other components within normal limits  SALICYLATE LEVEL - Abnormal; Notable for the following components:   Salicylate Lvl <7.0 (*)    All other components within normal limits  ACETAMINOPHEN LEVEL - Abnormal; Notable for the following components:   Acetaminophen (Tylenol), Serum <10 (*)    All other components within normal limits  CBG MONITORING, ED - Abnormal; Notable for the following components:   Glucose-Capillary 119 (*)    All other components within normal limits  AMMONIA  ETHANOL  HIV-1 RNA QUANT-NO REFLEX-BLD  T-HELPER CELLS (CD4) COUNT (NOT AT [facility])  I-STAT CG4 LACTIC ACID, ED  I-STAT CG4 LACTIC ACID, ED  TROPONIN I (HIGH SENSITIVITY)     EKG EKG Interpretation Date/Time:  Wednesday February 19 2023 10:47:24 EST Ventricular Rate:  53 PR Interval:  167 QRS Duration:  94 QT Interval:  422 QTC Calculation: 397 R Axis:   75  Text Interpretation: Sinus rhythm Confirmed by Virgina Norfolk [phone]) on 02/19/2023 12:21:54 PM  Radiology CT Head Wo Contrast Result Date: 02/19/2023 CLINICAL DATA:  Mental status change, unknown cause EXAM: CT HEAD WITHOUT CONTRAST TECHNIQUE: Contiguous axial images were obtained from the base of the skull through the vertex without intravenous contrast. RADIATION DOSE REDUCTION: This exam was performed according to the departmental dose-optimization program which includes automated exposure control, adjustment of the mA and/or kV according to patient size and/or use of iterative reconstruction technique. COMPARISON:  Head CT 11/10/2022 FINDINGS: Brain: No hemorrhage. No hydrocephalus. No extra-axial fluid collection. No CT evidence  of an acute cortical infarct. No mass effect. No mass lesion. Vascular: No hyperdense vessel or unexpected calcification. Skull: Normal. Negative for fracture or focal lesion. Sinuses/Orbits: No middle ear or mastoid effusion. Paranasal sinuses are clear. Orbits are unremarkable. Other: None. IMPRESSION: No acute intracranial abnormality. Electronically Signed   By: Lorenza Cambridge M.D.   On: 02/19/2023 14:06    Procedures Procedures    Medications Ordered in ED Medications  ziprasidone (GEODON) injection 20 mg (20 mg Intramuscular Given by Other 02/19/23 1124)  LORazepam (ATIVAN) injection 2 mg (2 mg Intramuscular Given by Other 02/19/23 1122)  sterile water (preservative free) injection (  Given 02/19/23 1124)  diphenhydrAMINE (BENADRYL) injection 25 mg (25 mg Intravenous Given 02/19/23 1246)  haloperidol lactate (HALDOL) injection 10 mg (10 mg Intravenous Given 02/19/23 1252)    ED Course/ Medical Decision Making/ A&P Clinical Course as of 02/19/23 1514  Wed Feb 19, 2023  1117 Per his husband, concerned he was taking more Gabapentin than usual, maybe taken 1 or 2 more than usual daily. No alcohol.  Yesterday was "acting funny" after meds given at 2 pm. Confused. Acting like a "zombie". A couple of days ago he attempted to take a whole handful of "sleeping pills". [JR]  1203 ED EKG [JR]  1213 Troponin I (High Sensitivity) [JR]  1510 Reassess. If back to baseline can dc home. [BH]    Clinical Course User Index [BH] Henderly, Britni A, PA-C [JR] Gareth Eagle, PA-C                                 Medical Decision Making Amount and/or Complexity of Data Reviewed Labs: ordered. Decision-making details documented in ED Course. Radiology: ordered. ECG/medicine tests:  Decision-making details documented in ED Course.  Risk Prescription drug management.   Initial Impression and Ddx 57 year old well-appearing male presenting for altered mental status.  Exam notable for confusion, agitation but otherwise reassuring.  DDx includes stroke, electrolyte derangement, hepatic encephalopathy, drug intoxication, sepsis, other. Patient PMH that increases complexity of ED encounter:  history of HIV, acute encephalopathy depression with psychotic features, long-term NSAID use   Interpretation of Diagnostics - I independent reviewed and interpreted the labs as followed: Urine drug positive for THC  - I independently visualized the following imaging with scope of interpretation limited to determining acute life threatening conditions related to emergency care: CT head, which revealed negative for acute process  -I personally reviewed interpret EKG which revealed sinus rhythm and non ishemic  Patient Reassessment and Ultimate Disposition/Management On reassessment, patient seem to be more agitated yelling and screaming nonsensical words.  Also started to disrobe himself.  Treated initially with Geodon and Ativan and then subsequently with Haldol and Benadryl.   Patient did settle down and went to sleep.  On second assessment, patient was drowsy but mentation was improved.  At this time he denies SI and HI.  Denies illicit substance use.  Plan at this time will be allow for wake-up similar, metabolize, ambulate and reassess symptoms.  Could potentially be discharged if no concerns for harm to himself or others.  Signed out to PA Henderly.   Patient management required discussion with the following services or consulting groups:  None  Complexity of Problems Addressed Acute complicated illness or Injury  Additional Data Reviewed and Analyzed Further history obtained from: Further history from spouse/family member, Past medical history and medications listed in the EMR, and  Prior ED visit notes  Patient Encounter Risk Assessment Consideration of hospitalization    Final Clinical Impression(s) / ED Diagnoses Final diagnoses:  Altered mental status, unspecified altered mental status type    Rx / DC Orders ED Discharge Orders     None         Deven, Nimtz, PA-C 02/19/23 1514    Gareth Eagle, PA-C 02/19/23 1514    Virgina Norfolk, DO 02/19/23 2248

## 2023-02-19 NOTE — ED Notes (Signed)
Pt resting with eyes closed; respirations spontaneous, even, unlabored 

## 2023-02-19 NOTE — ED Notes (Signed)
Pt oriented to place and person; following some commands; pt agreeable to using call bell prior to getting up; restraints removed from bilateral wrists

## 2023-02-19 NOTE — ED Provider Notes (Addendum)
Shared PA visit.  Level 5 caveat as patient is combative disoriented.  Patient moving all extremities.  He is awake and alert.  He is screaming intermittently.  He is pulling at equipment.  He has a history of HIV, depression, psychosis.  History of alcoholism.  Family member states maybe some extra doses of gabapentin.  They are not able to provide much history either.  Overall he is got normal vitals.  No signs of trauma on exam.  Differential diagnosis likely mental health related or polysubstance related but will evaluate for infectious process, electrolyte abnormality, intracranial process.  Does not really appear to be in alcohol withdrawal is not having seizures.  Not sure if this may be is opioid related or some other chronic medication related.  Lab work thus far is unremarkable.  No leukocytosis or lactic acidosis.  Lab work thus far okay.  He has required IM Geodon, Ativan and Haldol for sedation.  Still awaiting head CT.  Will allow him to metabolize and see if we can obtain a better history and physical.  Seems like he has a history of an episode like this in the past.  Overall I do not have any concern for infectious process or acute neurologic process otherwise at this time.  See my PA note for further results, evaluation, disposition of the patient.  This chart was dictated using voice recognition software.  Despite best efforts to proofread,  errors can occur which can change the documentation meaning.    Virgina Norfolk, DO 02/19/23 1258    Virgina Norfolk, DO 02/19/23 1259

## 2023-02-19 NOTE — ED Notes (Signed)
Pt found standing in room, ripping off monitoring equipment and green mitts; pt remains very disoriented; pt directed back to stretcher; pt continues to try and get up on remove monitoring equipment

## 2023-02-19 NOTE — ED Notes (Signed)
Patient transported to CT 

## 2023-02-19 NOTE — ED Provider Notes (Signed)
Care assumed from previous provider, see note for full HPI  In summation 57 year old multiple medical problems here for evaluation of altered mental status.  Patient was combative, disoriented however is moving all 4 extremities.  Apparently 10 member stated he may have taken some extra doses of gabapentin however according to previous providers family was not able to provide much other history as well.  He had no obvious signs of trauma on exam.  Had received Geodon, Ativan and Haldol for sedation.  Apparently has had an episode like this previously.  There is low concern for infectious or neurologic process at that time.  Plan is to continue to monitor him follow-up on his CT if he is back to baseline, reading workup is negative can DC home Physical Exam  BP (!) 166/85   Pulse (!) 108   Temp 98.4 F (36.9 C) (Oral)   Resp 13   SpO2 99%   Physical Exam Vitals and nursing note reviewed.  Constitutional:      General: He is not in acute distress.    Appearance: He is well-developed. He is not ill-appearing, toxic-appearing or diaphoretic.  HENT:     Head: Atraumatic.     Nose: Nose normal.     Mouth/Throat:     Mouth: Mucous membranes are moist.  Eyes:     Pupils: Pupils are equal, round, and reactive to light.  Cardiovascular:     Rate and Rhythm: Normal rate and regular rhythm.     Pulses: Normal pulses.     Heart sounds: Normal heart sounds.  Pulmonary:     Effort: Pulmonary effort is normal. No respiratory distress.     Breath sounds: Normal breath sounds.  Abdominal:     General: Bowel sounds are normal. There is no distension.     Palpations: Abdomen is soft.     Tenderness: There is no abdominal tenderness. There is no right CVA tenderness, left CVA tenderness, guarding or rebound.  Musculoskeletal:        General: Normal range of motion.     Cervical back: Normal range of motion and neck supple.  Skin:    General: Skin is warm and dry.     Capillary Refill: Capillary  refill takes less than 2 seconds.  Neurological:     General: No focal deficit present.     Mental Status: He is alert.     Cranial Nerves: No cranial nerve deficit.     Sensory: No sensory deficit.     Motor: No weakness.     Gait: Gait normal.     Comments: Oriented to person, place, unsure of year however states Christmas is coming     Procedures  Procedures Labs Reviewed  COMPREHENSIVE METABOLIC PANEL - Abnormal; Notable for the following components:      Result Value   Glucose, Bld 116 (*)    Total Protein 6.4 (*)    ALT 54 (*)    All other components within normal limits  CBC - Abnormal; Notable for the following components:   RBC 3.93 (*)    MCV 100.8 (*)    MCH 34.1 (*)    Platelets 105 (*)    All other components within normal limits  RAPID URINE DRUG SCREEN, HOSP PERFORMED - Abnormal; Notable for the following components:   Tetrahydrocannabinol POSITIVE (*)    All other components within normal limits  URINALYSIS, W/ REFLEX TO CULTURE (INFECTION SUSPECTED) - Abnormal; Notable for the following components:   APPearance  CLOUDY (*)    Bacteria, UA FEW (*)    All other components within normal limits  SALICYLATE LEVEL - Abnormal; Notable for the following components:   Salicylate Lvl <7.0 (*)    All other components within normal limits  ACETAMINOPHEN LEVEL - Abnormal; Notable for the following components:   Acetaminophen (Tylenol), Serum <10 (*)    All other components within normal limits  T-HELPER CELLS (CD4) COUNT (NOT AT [facility]) - Abnormal; Notable for the following components:   CD4 T Cell Abs 70 (*)    CD4 % Helper T Cell 13 (*)    All other components within normal limits  CBG MONITORING, ED - Abnormal; Notable for the following components:   Glucose-Capillary 119 (*)    All other components within normal limits  AMMONIA  ETHANOL  HIV-1 RNA QUANT-NO REFLEX-BLD  I-STAT CG4 LACTIC ACID, ED  I-STAT CG4 LACTIC ACID, ED  TROPONIN I (HIGH SENSITIVITY)   MR  Brain W and Wo Contrast Result Date: 02/19/2023 CLINICAL DATA:  Delirium, HIV EXAM: MRI HEAD WITHOUT AND WITH CONTRAST TECHNIQUE: Multiplanar, multiecho pulse sequences of the brain and surrounding structures were obtained without and with intravenous contrast. CONTRAST:  5mL GADAVIST GADOBUTROL 1 MMOL/ML IV SOLN COMPARISON:  02/10/2017 MRI head, correlation is also made with 02/19/2023 CT head FINDINGS: Brain: No restricted diffusion to suggest acute or subacute infarct. No abnormal parenchymal or meningeal enhancement. No acute hemorrhage, mass, mass effect, or midline shift. No hydrocephalus or extra-axial collection. Pituitary and craniocervical junction within normal limits. No hemosiderin deposition to suggest remote hemorrhage. Normal cerebral volume for age. Vascular: Normal arterial flow voids. Normal arterial and venous enhancement. Skull and upper cervical spine: Normal marrow signal. Sinuses/Orbits: Left maxillary mucous retention cyst. Mucosal thickening in the ethmoid air cells and frontal sinuses. No acute finding in the orbits. Other: The mastoid air cells are well aerated. IMPRESSION: No acute intracranial process. No evidence of acute or subacute infarct. No etiology is seen the patient's altered mental status. Electronically Signed   By: Wiliam Ke M.D.   On: 02/19/2023 18:12   CT Head Wo Contrast Result Date: 02/19/2023 CLINICAL DATA:  Mental status change, unknown cause EXAM: CT HEAD WITHOUT CONTRAST TECHNIQUE: Contiguous axial images were obtained from the base of the skull through the vertex without intravenous contrast. RADIATION DOSE REDUCTION: This exam was performed according to the departmental dose-optimization program which includes automated exposure control, adjustment of the mA and/or kV according to patient size and/or use of iterative reconstruction technique. COMPARISON:  Head CT 11/10/2022 FINDINGS: Brain: No hemorrhage. No hydrocephalus. No extra-axial fluid collection.  No CT evidence of an acute cortical infarct. No mass effect. No mass lesion. Vascular: No hyperdense vessel or unexpected calcification. Skull: Normal. Negative for fracture or focal lesion. Sinuses/Orbits: No middle ear or mastoid effusion. Paranasal sinuses are clear. Orbits are unremarkable. Other: None. IMPRESSION: No acute intracranial abnormality. Electronically Signed   By: Lorenza Cambridge M.D.   On: 02/19/2023 14:06    ED Course / MDM   Clinical Course as of 02/19/23 2159  Wed Feb 19, 2023  1117 Per his husband, concerned he was taking more Gabapentin than usual, maybe taken 1 or 2 more than usual daily. No alcohol.  Yesterday was "acting funny" after meds given at 2 pm. Confused. Acting like a "zombie". A couple of days ago he attempted to take a whole handful of "sleeping pills". [JR]  1203 ED EKG [JR]  1213 Troponin I (High Sensitivity) [  JR]  1510 Reassess. If back to baseline can dc home. [BH]  1608 Alert more awake. States he is not sure what happened. Alert to person, place ([place], [place]), Christmas is upcoming holiday, president. [BH]    Clinical Course User Index [BH] Maika Kaczmarek A, PA-C [JR] Gareth Eagle, PA-C   Care assumed from previous provider, see note for full HPI  In summation 57 year old multiple medical problems here for evaluation of altered mental status.  Patient was combative, disoriented however is moving all 4 extremities.  Apparently 10 member stated he may have taken some extra doses of gabapentin however according to previous providers family was not able to provide much other history as well.  He had no obvious signs of trauma on exam.  Had received Geodon, Ativan and Haldol for sedation.  Apparently has had an episode like this previously.  There is low concern for infectious or neurologic process at that time.  Plan is to continue to monitor him follow-up on his CT if he is back to baseline, reading workup is negative can DC home  Patient reassessed.  Will  get MRI brain given his history of HIV  Patient reassessed.  We discussed his labs and imaging.  His mentation is significantly improved.  He is back to baseline according to family.  He is alert and oriented x 4.  He has a nonfocal neuroexam without deficits.  He denies any SI, HI, AVH.  Stated maybe he "took little something extra "to help him to sleep.  He denies any intent at self-harm.  I did review a note from a LCSW about SA IOP treatment. Patient adamantly denies any use.  We discussed his CD4 count here.  He states he recently established care with [facility].  He is not currently on any Bactrim.  Unclear etiology of his transient altered mental status however he is back to baseline requesting discharge.  He appears to currently have capacity make medical decisions.  Does not meet IVC criteria.  Not currently altered.  Will have him follow-up outpatient, return for any worsening symptoms.  He denies any current infectious symptoms to suggest sepsis.  The patient has been appropriately medically screened and/or stabilized in the ED. I have low suspicion for any other emergent medical condition which would require further screening, evaluation or treatment in the ED or require inpatient management.  Patient is hemodynamically stable and in no acute distress.  Patient able to ambulate in department prior to ED.  Evaluation does not show acute pathology that would require ongoing or additional emergent interventions while in the emergency department or further inpatient treatment.  I have discussed the diagnosis with the patient and answered all questions.  Pain is been managed while in the emergency department and patient has no further complaints prior to discharge.  Patient is comfortable with plan discussed in room and is stable for discharge at this time.  I have discussed strict return precautions for returning to the emergency department.  Patient was encouraged to follow-up with  PCP/specialist refer to at discharge.    Medical Decision Making Amount and/or Complexity of Data Reviewed Independent Historian: spouse External Data Reviewed: labs, radiology, ECG and notes. Labs: ordered. Decision-making details documented in ED Course. Radiology: ordered and independent interpretation performed. Decision-making details documented in ED Course. ECG/medicine tests: ordered and independent interpretation performed. Decision-making details documented in ED Course.  Risk OTC drugs. Prescription drug management. Parenteral controlled substances. Decision regarding hospitalization. Diagnosis or treatment significantly limited  by social determinants of health.      Alicea Wente A, PA-C 02/19/23 2159    Loetta Rough, MD 02/20/23 [phone]

## 2023-02-19 NOTE — ED Notes (Signed)
Food and drink provided.

## 2023-02-20 LAB — HIV-1 RNA QUANT-NO REFLEX-BLD
HIV 1 RNA Quant: 90 {copies}/mL
LOG10 HIV-1 RNA: 1.954 {Log}

## 2023-03-09 ENCOUNTER — Encounter (HOSPITAL_COMMUNITY): Payer: Self-pay

## 2023-03-09 ENCOUNTER — Emergency Department (HOSPITAL_COMMUNITY): Payer: Medicare Other

## 2023-03-09 ENCOUNTER — Emergency Department (HOSPITAL_COMMUNITY)
Admission: EM | Admit: 2023-03-09 | Discharge: 2023-03-09 | Disposition: A | Discharge status: Home / Self Care | Payer: Medicare Other | Attending: Emergency Medicine | Admitting: Emergency Medicine

## 2023-03-09 DIAGNOSIS — S299XXA Unspecified injury of thorax, initial encounter: Secondary | ICD-10-CM | POA: Insufficient documentation

## 2023-03-09 DIAGNOSIS — T50901A Poisoning by unspecified drugs, medicaments and biological substances, accidental (unintentional), initial encounter: Secondary | ICD-10-CM | POA: Diagnosis not present

## 2023-03-09 DIAGNOSIS — R404 Transient alteration of awareness: Secondary | ICD-10-CM | POA: Diagnosis not present

## 2023-03-09 DIAGNOSIS — R400 Somnolence: Secondary | ICD-10-CM | POA: Insufficient documentation

## 2023-03-09 DIAGNOSIS — S32019A Unspecified fracture of first lumbar vertebra, initial encounter for closed fracture: Secondary | ICD-10-CM | POA: Diagnosis not present

## 2023-03-09 DIAGNOSIS — Y900 Blood alcohol level of less than 20 mg/100 ml: Secondary | ICD-10-CM | POA: Diagnosis not present

## 2023-03-09 DIAGNOSIS — S32029A Unspecified fracture of second lumbar vertebra, initial encounter for closed fracture: Secondary | ICD-10-CM | POA: Insufficient documentation

## 2023-03-09 DIAGNOSIS — R7989 Other specified abnormal findings of blood chemistry: Secondary | ICD-10-CM

## 2023-03-09 DIAGNOSIS — S27329A Contusion of lung, unspecified, initial encounter: Secondary | ICD-10-CM | POA: Diagnosis not present

## 2023-03-09 DIAGNOSIS — R945 Abnormal results of liver function studies: Secondary | ICD-10-CM | POA: Insufficient documentation

## 2023-03-09 DIAGNOSIS — S3992XA Unspecified injury of lower back, initial encounter: Secondary | ICD-10-CM | POA: Diagnosis present

## 2023-03-09 DIAGNOSIS — S32009A Unspecified fracture of unspecified lumbar vertebra, initial encounter for closed fracture: Secondary | ICD-10-CM

## 2023-03-09 DIAGNOSIS — Z21 Asymptomatic human immunodeficiency virus [HIV] infection status: Secondary | ICD-10-CM | POA: Diagnosis not present

## 2023-03-09 DIAGNOSIS — M62830 Muscle spasm of back: Secondary | ICD-10-CM | POA: Diagnosis not present

## 2023-03-09 LAB — I-STAT VENOUS BLOOD GAS, ED
Acid-base deficit: 2 mmol/L (ref 0.0–2.0)
Bicarbonate: 25.5 mmol/L (ref 20.0–28.0)
Calcium, Ion: 1.22 mmol/L (ref 1.15–1.40)
HCT: 40 % (ref 39.0–52.0)
Hemoglobin: 13.6 g/dL (ref 13.0–17.0)
O2 Saturation: 93 %
Potassium: 5.1 mmol/L (ref 3.5–5.1)
Sodium: 138 mmol/L (ref 135–145)
TCO2: 27 mmol/L (ref 22–32)
pCO2, Ven: 55.6 mm[Hg] (ref 44–60)
pH, Ven: 7.269 (ref 7.25–7.43)
pO2, Ven: 78 mm[Hg] — ABNORMAL HIGH (ref 32–45)

## 2023-03-09 LAB — CBC WITH DIFFERENTIAL/PLATELET
Abs Immature Granulocytes: 0.03 10*3/uL (ref 0.00–0.07)
Basophils Absolute: 0.1 10*3/uL (ref 0.0–0.1)
Basophils Relative: 1 %
Eosinophils Absolute: 0.1 10*3/uL (ref 0.0–0.5)
Eosinophils Relative: 1 %
HCT: 39.9 % (ref 39.0–52.0)
Hemoglobin: 13.7 g/dL (ref 13.0–17.0)
Immature Granulocytes: 0 %
Lymphocytes Relative: 9 %
Lymphs Abs: 0.8 10*3/uL (ref 0.7–4.0)
MCH: 34.1 pg — ABNORMAL HIGH (ref 26.0–34.0)
MCHC: 34.3 g/dL (ref 30.0–36.0)
MCV: 99.3 fL (ref 80.0–100.0)
Monocytes Absolute: 0.6 10*3/uL (ref 0.1–1.0)
Monocytes Relative: 7 %
Neutro Abs: 6.5 10*3/uL (ref 1.7–7.7)
Neutrophils Relative %: 82 %
Platelets: 176 10*3/uL (ref 150–400)
RBC: 4.02 MIL/uL — ABNORMAL LOW (ref 4.22–5.81)
RDW: 12.4 % (ref 11.5–15.5)
WBC: 8 10*3/uL (ref 4.0–10.5)
nRBC: 0 % (ref 0.0–0.2)

## 2023-03-09 LAB — COMPREHENSIVE METABOLIC PANEL
ALT: 281 U/L — ABNORMAL HIGH (ref 0–44)
AST: 325 U/L — ABNORMAL HIGH (ref 15–41)
Albumin: 3.8 g/dL (ref 3.5–5.0)
Alkaline Phosphatase: 136 U/L — ABNORMAL HIGH (ref 38–126)
Anion gap: 12 (ref 5–15)
BUN: 13 mg/dL (ref 6–20)
CO2: 23 mmol/L (ref 22–32)
Calcium: 9.2 mg/dL (ref 8.9–10.3)
Chloride: 103 mmol/L (ref 98–111)
Creatinine, Ser: 1.02 mg/dL (ref 0.61–1.24)
GFR, Estimated: >60 mL/min (ref >60)
Glucose, Bld: 97 mg/dL (ref 70–99)
Potassium: 5.2 mmol/L — ABNORMAL HIGH (ref 3.5–5.1)
Sodium: 138 mmol/L (ref 135–145)
Total Bilirubin: 0.7 mg/dL (ref 0.0–1.2)
Total Protein: 6.2 g/dL — ABNORMAL LOW (ref 6.5–8.1)

## 2023-03-09 LAB — TSH: TSH: 0.832 u[IU]/mL (ref 0.350–4.500)

## 2023-03-09 LAB — SALICYLATE LEVEL: Salicylate Lvl: <7 mg/dL — ABNORMAL LOW (ref 7.0–30.0)

## 2023-03-09 LAB — I-STAT CHEM 8, ED
BUN: 13 mg/dL (ref 6–20)
Calcium, Ion: 1.22 mmol/L (ref 1.15–1.40)
Chloride: 103 mmol/L (ref 98–111)
Creatinine, Ser: 1 mg/dL (ref 0.61–1.24)
Glucose, Bld: 91 mg/dL (ref 70–99)
HCT: 39 % (ref 39.0–52.0)
Hemoglobin: 13.3 g/dL (ref 13.0–17.0)
Potassium: 5 mmol/L (ref 3.5–5.1)
Sodium: 138 mmol/L (ref 135–145)
TCO2: 24 mmol/L (ref 22–32)

## 2023-03-09 LAB — MAGNESIUM: Magnesium: 2.3 mg/dL (ref 1.7–2.4)

## 2023-03-09 LAB — AMMONIA: Ammonia: 16 umol/L (ref 9–35)

## 2023-03-09 LAB — ACETAMINOPHEN LEVEL: Acetaminophen (Tylenol), Serum: <10 ug/mL — ABNORMAL LOW (ref 10–30)

## 2023-03-09 LAB — ETHANOL: Alcohol, Ethyl (B): 12 mg/dL — ABNORMAL HIGH (ref <10)

## 2023-03-09 MED ORDER — SODIUM CHLORIDE 0.9 % IV BOLUS
1000.0000 mL | Freq: Once | INTRAVENOUS | Status: AC
  Administered 2023-03-09: 1000 mL via INTRAVENOUS

## 2023-03-09 MED ORDER — CYCLOBENZAPRINE HCL 10 MG PO TABS: 5.0000 mg | ORAL_TABLET | Freq: Two times a day (BID) | ORAL | 0 refills | Status: AC | PRN

## 2023-03-09 MED ORDER — CYCLOBENZAPRINE HCL 10 MG PO TABS
5.0000 mg | ORAL_TABLET | Freq: Once | ORAL | Status: AC
  Administered 2023-03-09: 5 mg via ORAL
  Filled 2023-03-09: qty 1

## 2023-03-09 MED ORDER — LIDOCAINE 5 % EX PTCH: 1.0000 | MEDICATED_PATCH | CUTANEOUS | 0 refills | Status: AC

## 2023-03-09 MED ORDER — LIDOCAINE 5 % EX PTCH
1.0000 | MEDICATED_PATCH | CUTANEOUS | Status: DC
  Administered 2023-03-09: 1 via TRANSDERMAL
  Filled 2023-03-09: qty 1

## 2023-03-09 MED ORDER — IOHEXOL 350 MG/ML SOLN
75.0000 mL | Freq: Once | INTRAVENOUS | Status: AC | PRN
  Administered 2023-03-09: 75 mL via INTRAVENOUS
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
## 2023-03-09 NOTE — Discharge Instructions (Signed)
 Your history, exam, and workup today revealed you are altered mental status and unresponsiveness was likely due to your accidental overdose of Xyrem medication.  We did however do workup to look for traumatic injuries and the only injuries we saw were some mild pulmonary contusions that did not lead to any significant oxygen saturation abnormalities and also 2 small transverse process fractures in your left low back.  You had overlying muscle spasm and tenderness so please use the low-dose muscle relaxant and Lidoderm  patches to help with this.  We recommend follow-up with an outpatient back doctor.  Please be careful with your medications as you are very altered and unresponsive today after the home medications.  Please do not mix the medications and try to rest and stay hydrated.  Please avoid alcohol and other sedating medications.  Your liver function was elevated today so please follow-up with your primary doctor to have this reevaluated and avoid Tylenol  or alcohol.  If any symptoms change or worsen acutely, please return to the nearest emergency department.
